# Patient Record
Sex: Female | Born: 1950 | Race: White | Hispanic: No | Marital: Single | Smoking: Never smoker
Health system: Southern US, Community
[De-identification: ages and names within clinical notes are randomized; demographics above are authoritative.]

## PROBLEM LIST (undated history)

## (undated) DIAGNOSIS — I498 Other specified cardiac arrhythmias: Secondary | ICD-10-CM

## (undated) DIAGNOSIS — K219 Gastro-esophageal reflux disease without esophagitis: Secondary | ICD-10-CM

## (undated) DIAGNOSIS — M6282 Rhabdomyolysis: Secondary | ICD-10-CM

## (undated) DIAGNOSIS — E875 Hyperkalemia: Secondary | ICD-10-CM

## (undated) DIAGNOSIS — E785 Hyperlipidemia, unspecified: Secondary | ICD-10-CM

## (undated) DIAGNOSIS — J45909 Unspecified asthma, uncomplicated: Secondary | ICD-10-CM

## (undated) DIAGNOSIS — G629 Polyneuropathy, unspecified: Secondary | ICD-10-CM

## (undated) DIAGNOSIS — E119 Type 2 diabetes mellitus without complications: Secondary | ICD-10-CM

## (undated) DIAGNOSIS — J189 Pneumonia, unspecified organism: Secondary | ICD-10-CM

## (undated) DIAGNOSIS — I471 Supraventricular tachycardia: Secondary | ICD-10-CM

## (undated) DIAGNOSIS — I4719 Other supraventricular tachycardia: Secondary | ICD-10-CM

## (undated) DIAGNOSIS — I10 Essential (primary) hypertension: Secondary | ICD-10-CM

## (undated) DIAGNOSIS — K589 Irritable bowel syndrome without diarrhea: Secondary | ICD-10-CM

## (undated) DIAGNOSIS — W19XXXA Unspecified fall, initial encounter: Secondary | ICD-10-CM

## (undated) DIAGNOSIS — C649 Malignant neoplasm of unspecified kidney, except renal pelvis: Secondary | ICD-10-CM

## (undated) DIAGNOSIS — Y92009 Unspecified place in unspecified non-institutional (private) residence as the place of occurrence of the external cause: Secondary | ICD-10-CM

## (undated) DIAGNOSIS — M109 Gout, unspecified: Secondary | ICD-10-CM

## (undated) HISTORY — DX: Gastro-esophageal reflux disease without esophagitis: K21.9

## (undated) HISTORY — DX: Gout, unspecified: M10.9

## (undated) HISTORY — DX: Unspecified asthma, uncomplicated: J45.909

## (undated) HISTORY — DX: Supraventricular tachycardia: I47.1

## (undated) HISTORY — DX: Malignant neoplasm of unspecified kidney, except renal pelvis: C64.9

## (undated) HISTORY — DX: Rhabdomyolysis: M62.82

## (undated) HISTORY — PX: OTHER SURGICAL HISTORY: SHX169

## (undated) HISTORY — DX: Unspecified place in unspecified non-institutional (private) residence as the place of occurrence of the external cause: Y92.009

## (undated) HISTORY — DX: Hyperlipidemia, unspecified: E78.5

## (undated) HISTORY — DX: Unspecified fall, initial encounter: W19.XXXA

## (undated) HISTORY — DX: Other specified cardiac arrhythmias: I49.8

## (undated) HISTORY — DX: Other supraventricular tachycardia: I47.19

## (undated) HISTORY — PX: HEMORROIDECTOMY: SUR656

## (undated) HISTORY — DX: Hyperkalemia: E87.5

## (undated) HISTORY — DX: Pneumonia, unspecified organism: J18.9

---

## 1999-01-03 ENCOUNTER — Other Ambulatory Visit: Admission: RE | Admit: 1999-01-03 | Discharge: 1999-01-03 | Payer: Self-pay | Admitting: Internal Medicine

## 1999-01-03 ENCOUNTER — Encounter (INDEPENDENT_AMBULATORY_CARE_PROVIDER_SITE_OTHER): Payer: Self-pay | Admitting: Specialist

## 1999-01-03 ENCOUNTER — Encounter: Payer: Self-pay | Admitting: Internal Medicine

## 1999-05-09 ENCOUNTER — Other Ambulatory Visit: Admission: RE | Admit: 1999-05-09 | Discharge: 1999-05-09 | Payer: Self-pay | Admitting: *Deleted

## 1999-07-01 ENCOUNTER — Encounter: Payer: Self-pay | Admitting: Surgery

## 1999-07-01 ENCOUNTER — Ambulatory Visit (HOSPITAL_COMMUNITY): Admission: RE | Admit: 1999-07-01 | Discharge: 1999-07-01 | Payer: Self-pay | Admitting: Surgery

## 1999-08-06 ENCOUNTER — Encounter (INDEPENDENT_AMBULATORY_CARE_PROVIDER_SITE_OTHER): Payer: Self-pay | Admitting: Specialist

## 1999-08-06 ENCOUNTER — Ambulatory Visit (HOSPITAL_COMMUNITY): Admission: RE | Admit: 1999-08-06 | Discharge: 1999-08-07 | Payer: Self-pay

## 1999-09-13 ENCOUNTER — Emergency Department (HOSPITAL_COMMUNITY): Admission: EM | Admit: 1999-09-13 | Discharge: 1999-09-13 | Payer: Self-pay | Admitting: Emergency Medicine

## 1999-09-20 ENCOUNTER — Inpatient Hospital Stay (HOSPITAL_COMMUNITY): Admission: AD | Admit: 1999-09-20 | Discharge: 1999-09-25 | Payer: Self-pay | Admitting: Cardiology

## 1999-09-20 ENCOUNTER — Encounter: Payer: Self-pay | Admitting: Internal Medicine

## 1999-09-21 ENCOUNTER — Encounter: Payer: Self-pay | Admitting: Internal Medicine

## 1999-09-24 ENCOUNTER — Encounter: Payer: Self-pay | Admitting: Internal Medicine

## 2000-01-02 ENCOUNTER — Other Ambulatory Visit: Admission: RE | Admit: 2000-01-02 | Discharge: 2000-01-02 | Payer: Self-pay | Admitting: Internal Medicine

## 2000-01-02 ENCOUNTER — Encounter: Payer: Self-pay | Admitting: Internal Medicine

## 2001-10-29 ENCOUNTER — Other Ambulatory Visit: Admission: RE | Admit: 2001-10-29 | Discharge: 2001-10-29 | Payer: Self-pay | Admitting: *Deleted

## 2003-07-05 ENCOUNTER — Encounter: Payer: Self-pay | Admitting: Family Medicine

## 2003-07-05 LAB — CONVERTED CEMR LAB

## 2004-04-29 ENCOUNTER — Encounter: Admission: RE | Admit: 2004-04-29 | Discharge: 2004-04-29 | Payer: Self-pay | Admitting: Family Medicine

## 2004-09-23 ENCOUNTER — Ambulatory Visit: Payer: Self-pay | Admitting: Internal Medicine

## 2006-07-21 ENCOUNTER — Ambulatory Visit: Payer: Self-pay | Admitting: Family Medicine

## 2006-07-21 DIAGNOSIS — E119 Type 2 diabetes mellitus without complications: Secondary | ICD-10-CM

## 2006-07-21 DIAGNOSIS — L538 Other specified erythematous conditions: Secondary | ICD-10-CM | POA: Insufficient documentation

## 2006-07-21 DIAGNOSIS — I10 Essential (primary) hypertension: Secondary | ICD-10-CM | POA: Insufficient documentation

## 2006-07-21 DIAGNOSIS — L2089 Other atopic dermatitis: Secondary | ICD-10-CM | POA: Insufficient documentation

## 2006-07-28 ENCOUNTER — Encounter: Payer: Self-pay | Admitting: Family Medicine

## 2006-07-28 DIAGNOSIS — E785 Hyperlipidemia, unspecified: Secondary | ICD-10-CM | POA: Insufficient documentation

## 2006-07-28 DIAGNOSIS — Q605 Renal hypoplasia, unspecified: Secondary | ICD-10-CM

## 2006-07-28 DIAGNOSIS — C649 Malignant neoplasm of unspecified kidney, except renal pelvis: Secondary | ICD-10-CM | POA: Insufficient documentation

## 2006-07-28 DIAGNOSIS — Q602 Renal agenesis, unspecified: Secondary | ICD-10-CM | POA: Insufficient documentation

## 2006-08-05 ENCOUNTER — Ambulatory Visit: Payer: Self-pay | Admitting: Family Medicine

## 2006-08-05 LAB — CONVERTED CEMR LAB: Hgb A1c MFr Bld: 6.7 %

## 2006-12-10 ENCOUNTER — Ambulatory Visit: Payer: Self-pay | Admitting: Family Medicine

## 2006-12-10 LAB — CONVERTED CEMR LAB
Bilirubin Urine: NEGATIVE
Blood in Urine, dipstick: NEGATIVE
Glucose, Urine, Semiquant: NEGATIVE
Hgb A1c MFr Bld: 6.7 %
Ketones, urine, test strip: NEGATIVE
Nitrite: NEGATIVE
Specific Gravity, Urine: 1.02
pH: 6

## 2007-01-07 ENCOUNTER — Telehealth: Payer: Self-pay | Admitting: Family Medicine

## 2007-01-18 ENCOUNTER — Encounter: Payer: Self-pay | Admitting: Family Medicine

## 2007-01-18 ENCOUNTER — Ambulatory Visit: Payer: Self-pay | Admitting: Internal Medicine

## 2007-07-09 ENCOUNTER — Ambulatory Visit: Payer: Self-pay | Admitting: Family Medicine

## 2007-07-09 LAB — CONVERTED CEMR LAB: Microalbumin U total vol: 80 mg/L

## 2007-07-27 ENCOUNTER — Telehealth: Payer: Self-pay | Admitting: Family Medicine

## 2007-08-05 ENCOUNTER — Ambulatory Visit: Payer: Self-pay | Admitting: Family Medicine

## 2007-09-03 ENCOUNTER — Encounter: Payer: Self-pay | Admitting: Family Medicine

## 2007-09-03 LAB — CONVERTED CEMR LAB
Albumin: 4.4 g/dL (ref 3.5–5.2)
Alkaline Phosphatase: 97 units/L (ref 39–117)
BUN: 23 mg/dL (ref 6–23)
Calcium: 10.1 mg/dL (ref 8.4–10.5)
Creatinine, Ser: 0.79 mg/dL (ref 0.40–1.20)
Glucose, Bld: 121 mg/dL — ABNORMAL HIGH (ref 70–99)
HDL: 41 mg/dL (ref >39)
Potassium: 4.6 meq/L (ref 3.5–5.3)
Total CHOL/HDL Ratio: 5.3
Triglycerides: 530 mg/dL — ABNORMAL HIGH (ref <150)

## 2007-09-06 ENCOUNTER — Telehealth: Payer: Self-pay | Admitting: Family Medicine

## 2007-09-06 ENCOUNTER — Ambulatory Visit: Payer: Self-pay | Admitting: Family Medicine

## 2007-09-08 DIAGNOSIS — D126 Benign neoplasm of colon, unspecified: Secondary | ICD-10-CM

## 2007-09-08 DIAGNOSIS — M109 Gout, unspecified: Secondary | ICD-10-CM

## 2007-09-08 DIAGNOSIS — K219 Gastro-esophageal reflux disease without esophagitis: Secondary | ICD-10-CM

## 2007-09-08 DIAGNOSIS — K589 Irritable bowel syndrome without diarrhea: Secondary | ICD-10-CM | POA: Insufficient documentation

## 2008-01-03 ENCOUNTER — Ambulatory Visit: Payer: Self-pay | Admitting: Family Medicine

## 2008-03-22 ENCOUNTER — Ambulatory Visit: Payer: Self-pay | Admitting: Family Medicine

## 2008-03-22 LAB — CONVERTED CEMR LAB
Direct LDL: 124 mg/dL — ABNORMAL HIGH
HDL: 43 mg/dL (ref >39)
Triglycerides: 470 mg/dL — ABNORMAL HIGH (ref <150)

## 2008-11-20 ENCOUNTER — Encounter: Payer: Self-pay | Admitting: Family Medicine

## 2009-01-02 ENCOUNTER — Ambulatory Visit: Payer: Self-pay | Admitting: Family Medicine

## 2009-01-04 ENCOUNTER — Encounter: Payer: Self-pay | Admitting: Family Medicine

## 2009-01-04 LAB — CONVERTED CEMR LAB
BUN: 19 mg/dL (ref 6–23)
Basophils Relative: 1 % (ref 0–1)
CO2: 19 meq/L (ref 19–32)
Cholesterol: 235 mg/dL — ABNORMAL HIGH (ref 0–200)
Creatinine, Ser: 0.87 mg/dL (ref 0.40–1.20)
Eosinophils Absolute: 0.8 10*3/uL — ABNORMAL HIGH (ref 0.0–0.7)
Eosinophils Relative: 7 % — ABNORMAL HIGH (ref 0–5)
Glucose, Bld: 184 mg/dL — ABNORMAL HIGH (ref 70–99)
Hemoglobin: 13.3 g/dL (ref 12.0–15.0)
Hgb A1c MFr Bld: 7.7 % — ABNORMAL HIGH (ref 4.6–6.1)
MCHC: 32.2 g/dL (ref 30.0–36.0)
MCV: 93.9 fL (ref 78.0–100.0)
Monocytes Absolute: 0.9 10*3/uL (ref 0.1–1.0)
Monocytes Relative: 8 % (ref 3–12)
RBC: 4.4 M/uL (ref 3.87–5.11)
Sodium: 137 meq/L (ref 135–145)
Total Bilirubin: 0.4 mg/dL (ref 0.3–1.2)
Total Protein: 7.1 g/dL (ref 6.0–8.3)
Triglycerides: 544 mg/dL — ABNORMAL HIGH (ref <150)

## 2009-01-16 ENCOUNTER — Telehealth: Payer: Self-pay | Admitting: Family Medicine

## 2009-01-23 ENCOUNTER — Telehealth: Payer: Self-pay | Admitting: Family Medicine

## 2009-04-05 ENCOUNTER — Encounter: Payer: Self-pay | Admitting: Family Medicine

## 2009-08-16 ENCOUNTER — Encounter (INDEPENDENT_AMBULATORY_CARE_PROVIDER_SITE_OTHER): Payer: Self-pay | Admitting: *Deleted

## 2009-09-05 ENCOUNTER — Ambulatory Visit: Payer: Self-pay | Admitting: Family Medicine

## 2010-03-04 ENCOUNTER — Telehealth: Payer: Self-pay | Admitting: Family Medicine

## 2010-03-22 ENCOUNTER — Encounter: Payer: Self-pay | Admitting: Family Medicine

## 2010-03-26 ENCOUNTER — Encounter (INDEPENDENT_AMBULATORY_CARE_PROVIDER_SITE_OTHER): Payer: Self-pay | Admitting: *Deleted

## 2010-06-05 ENCOUNTER — Encounter: Payer: Self-pay | Admitting: Family Medicine

## 2010-06-12 ENCOUNTER — Encounter: Payer: Self-pay | Admitting: Family Medicine

## 2010-07-04 LAB — CONVERTED CEMR LAB
Glucose: 146 mg/dL
Hgb A1c MFr Bld: 6.7 %

## 2010-07-04 NOTE — Miscellaneous (Signed)
   Clinical Lists Changes  Observations: Added new observation of MAMMOGRAM: normal (03/22/2010 16:31)      Preventive Care Screening  Mammogram:    Date:  03/22/2010    Results:  normal

## 2010-07-04 NOTE — Letter (Signed)
Summary: New Patient Letter  [facility]  [address] [place], [place] [address]   Phone: [phone]  Fax: [phone]       08/16/2009 MRN: [id number]  Jody Simpson [address] [place], [place]  [address]  Dear Jody Simpson,   Welcome to [facility] and thank you for choosing [place] as your Primary Care Providers. Enclosed you will find information about our practice that we hope you find helpful. We have also enclosed forms to be filled out prior to your visit. This will provide [place] with the necessary information and facilitate your being seen in a timely manner. If you have any questions, please call us at:  [phone]    and we will be happy to assist you. We look forward to seeing you at your scheduled appointment time.  Appointment   Wednesday September 05, 2009 @ 10am    with Dr.  Beverely Low               Sincerely,  Primary Health Care Team  Please arrive 15 minutes early for your first appointment and bring your insurance card. Co-pay is required at the time of your visit.  *****Please call the office if you are not able to keep this appointment. There is a charge of $50.00 if any appointment is not cancelled or rescheduled within 24 hours.

## 2010-07-04 NOTE — Assessment & Plan Note (Signed)
Summary: new to est//tx from [facility]   Vital Signs:  Patient profile:   60 year old female Height:      66 inches Weight:      295 pounds BMI:     47.79 Pulse rate:   76 / minute BP sitting:   126 / 80  (left arm)  Vitals Entered By: Doristine Devoid (September 05, 2009 10:03 AM) CC: new est- REFILL ON MEDS   History of Present Illness: 60 yo woman here today to establish care after switching PCPs.  1) DM- Sees Dr Chestine Spore (Endo).  Has run out of Glipizide, needs refill.  last A1C in 11/10.  has appt upcoming in June.    2) HTN- taking Lisinopril HCT and Atenolol.  adequately controlled here today.  no CP, SOB, HAs, visual changes, edema.  3) Hyperlipidemia- reports she is unable to take cholesterol meds due to adverse rxns.  statins exacerbate IBS.  taking 1 fish oil daily, reports unable to take more.  has not taken Wellchol and doesn't recall Zetia.  4) S/P renal cancer and L kidney removal- follows w/ Dr Vernie Ammons (uro) yearly.  5) IBS- Dr [name] is GI (hasn't seen recently)  Health Maintainence- GYN retired, has not had pap in 'a few years'.    Preventive Screening-Counseling & Management  Alcohol-Tobacco     Alcohol drinks/day: 0     Smoking Status: never  Caffeine-Diet-Exercise     Does Patient Exercise: no      Sexual History:  not in a relationship.        Drug Use:  never.    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 Take 1 Tablet By Mouth Once A  Day 2)  Glipizide 10 Mg  Tabs (Glipizide) .... Take 1 Tablet By Mouth Two Times A Day 3)  Famotidine 20 Mg Tabs (Famotidine) .... Take 2 Tablets Two Times A Day 4)  Allopurinol 300 Mg Tabs (Allopurinol) .... Take 1 Tablet By Mouth Once A Day 5)  Claritin 10 Mg Tabs (Loratadine) .... Take 1 Tablet By Mouth Once A Day 6)  Fluticasone Propionate 50 Mcg/act Susp (Fluticasone Propionate) .Marland Kitchen.. 1 Spray in Each Nostril Daily 7)  Calcarb 600/d 600-125 Mg-Unit Tabs (Calcium-Vitamin D) .Marland Kitchen.. 1 Tab By Mouth Bid 8)  Multivitamins  Tabs (Multiple Vitamin) .Marland Kitchen.. 1 Tab By Mouth Daily 9)  Desoximetasone 0.25 %  Crea (Desoximetasone) .... Use For Skin Rash 10)  Promethazine Hcl 25 Mg  Tabs (Promethazine Hcl) .... Take One Tablet By Mouth As Needed For Nausea 11)  Strips Adn Lancets .... Freestyle Light Glucose Meter 12)  Wheelchair .... of Choice.  Dx: Gout, Dm. 13)  Atenolol 50 Mg Tabs (Atenolol) .... Take One Tablet Two Times A Day 14)  Actos 15 Mg Tabs (Pioglitazone Hcl) .... Take One Tablet Daily  Allergies (verified): 1)  ! Pcn 2)  ! Demerol 3)  ! * Ivp Dye 4)  ! Iodine 5)  ! Hydrocodone 6)  ! Metformin Hcl (Metformin Hcl) 7)  ! Pravastatin  Sodium (Pravastatin Sodium) 8)  ! * Latex 9)  ! * Bananas,lettuce,onions,garlic,spicy Foods 10)  Asa 11)  * Aleve 12)  Codeine 13)  Valium 14)  Questran (Cholestyramine) 15)  Januvia (Sitagliptin Phosphate)  Past History:  Past Medical History: Diabetes mellitus, type II, diagnosed 9-04 : Endocrine is Dr. 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 - Dr [name] Hyperparathyroidism  hyperlipidemia colon polyps 8/00  Past Surgical History: ankle surgery 1994, 1995, 1998- Dr Uvaldo Bristle hemorrhoid surgery 1996, Dr Freddie Apley, benign reasons L kidney removed due to renal cell carcinoma, Dr Vernie Ammons Urol 1-97 hysterectomy 1997, Dr Carey Bullocks (retired) parathyroid surgery 2001, Dr Hillis Range  Family History: CAD-father HTN-no DM-paternal  grandmother STROKE-paternal grandmother COLON CA-no BREAST CA-mother  Social History: Single Never Smoked Alcohol use-no Sexual History:  not in a relationship Drug Use:  never  Review of Systems      See HPI  Physical Exam  General:  Well-developed,well-nourished,in no acute distress; alert,appropriate and cooperative throughout examination. Head:  Normocephalic and atraumatic without obvious abnormalities. No apparent alopecia or balding. Neck:  No deformities, masses, or tenderness noted.  Mild TM.   Lungs:  Normal respiratory effort, chest expands symmetrically. Lungs are clear to auscultation, no crackles or wheezes. Heart:  Normal rate and regular rhythm. S1 and S2 normal without gallop, murmur, click, rub or other extra sounds. Abdomen:  soft, NT/ND, +BS Pulses:  +2 carotid, radial, DP Extremities:  no C/C/E Psych:  Cognition and judgment appear intact. Alert and cooperative with normal attention span and concentration. No apparent delusions, illusions, hallucinations   Impression & Recommendations:  Problem # 1:  DIABETES MELLITUS, TYPE II (ICD-250.00) Assessment Unchanged pt following w/ Dr Chestine Spore.  pt to ask him if he would like quaterly A1Cs since she is only seeing him Q6 months.  would be happy to do interval A1Cs and fax him results.  med refills provided. The following medications were removed from the medication list:    Onglyza 5 Mg Tabs (Saxagliptin hcl) .Marland Kitchen... Take 1 tablet by mouth once a day Her updated medication list for this problem includes:    Lisinopril-hydrochlorothiazide 20-25 Mg Tabs (Lisinopril-hydrochlorothiazide) .Marland Kitchen... Take 1 tablet by mouth once a day    Glipizide 10 Mg Tabs (Glipizide) .Marland Kitchen... Take 1 tablet by mouth two times a day    Actos 15 Mg Tabs (Pioglitazone hcl) .Marland Kitchen... Take one tablet daily  Problem # 2:  HYPERLIPIDEMIA (ICD-272.4) Assessment: Unchanged pt only taking 1 fish oil.  reports she is intolerant of statins- will consider Zetia  or Welchol depending on upcoming labs. The following medications were removed from the medication list:    Lovaza 1 Gm Caps (Omega-3-acid ethyl esters) .Marland KitchenMarland KitchenMarland KitchenMarland Kitchen 4 tabs by mouth daily.  Problem # 3:  HYPERTENSION, BENIGN ESSENTIAL (ICD-401.1) Assessment: Unchanged pt's BP adequately controlled and she's asymptomatic.  no need for med changes at this time. The following medications were removed from the medication list:    Metoprolol Tartrate 100 Mg Tabs (Metoprolol tartrate) .Marland Kitchen... Take 1 tablet by mouth two times a day Her updated medication list for this problem includes:    Lisinopril-hydrochlorothiazide 20-25 Mg Tabs (Lisinopril-hydrochlorothiazide) .Marland Kitchen... Take 1 tablet by mouth once a day    Atenolol 50 Mg Tabs (Atenolol) .Marland Kitchen... Take one tablet two times a day  Complete Medication List: 1)  Lisinopril-hydrochlorothiazide 20-25 Mg Tabs (Lisinopril-hydrochlorothiazide) .... Take 1 tablet by mouth once a day 2)  Glipizide 10 Mg Tabs (Glipizide) .... Take 1 tablet by mouth two times  a day 3)  Famotidine 20 Mg Tabs (Famotidine) .... Take 2 tablets two times a day 4)  Allopurinol 300 Mg Tabs (Allopurinol) .... Take 1 tablet by mouth once a day 5)  Claritin 10 Mg Tabs (Loratadine) .... Take 1 tablet by mouth once a day 6)  Fluticasone Propionate 50 Mcg/act Susp (Fluticasone propionate) .Marland Kitchen.. 1 spray in each nostril daily 7)  Calcarb 600/d 600-125 Mg-unit Tabs (Calcium-vitamin d) .Marland Kitchen.. 1 tab by mouth bid 8)  Multivitamins Tabs (Multiple vitamin) .Marland Kitchen.. 1 tab by mouth daily 9)  Desoximetasone 0.25 % Crea (Desoximetasone) .... Use for skin rash 10)  Promethazine Hcl 25 Mg Tabs (Promethazine hcl) .... Take one tablet by mouth as needed for nausea 11)  Strips Adn Lancets  .... Freestyle light glucose meter 12)  Wheelchair  .... Of choice.  dx: gout, dm. 13)  Atenolol 50 Mg Tabs (Atenolol) .... Take one tablet two times a day 14)  Actos 15 Mg Tabs (Pioglitazone hcl) .... Take one tablet daily  Patient  Instructions: 1)  Please schedule a complete physical in the next 6 weeks at your convenience.  Do not eat before this appt 2)  Call with any questions or concerns 3)  Continue all your current medications 4)  Welcome!  We're glad to have you! Prescriptions: FAMOTIDINE 20 MG TABS (FAMOTIDINE) take 2 tablets two times a day  #120 x 3   Entered and Authorized by:   Neena Rhymes MD   Signed by:   Neena Rhymes MD on 09/05/2009   Method used:   Electronically to        [facility]* (retail)       [address]       [place], [place]  [address]       Ph: [phone]       Fax: [phone]   RxID:   [id number] ATENOLOL 50 MG TABS (ATENOLOL) take one tablet two times a day  #180 x 3   Entered and Authorized by:   Neena Rhymes MD   Signed by:   Neena Rhymes MD on 09/05/2009   Method used:   Electronically to        [facility]* (mail-order)             ,          Ph: [phone]       Fax: [phone]   RxID:   [id number] LISINOPRIL-HYDROCHLOROTHIAZIDE 20-25 MG  TABS (LISINOPRIL-HYDROCHLOROTHIAZIDE) Take 1 tablet by mouth once a day  #90 x 3   Entered and Authorized by:   Neena Rhymes MD   Signed by:   Neena Rhymes MD on 09/05/2009   Method used:   Electronically to        [facility]* (mail-order)             ,          Ph: [phone]       Fax: [phone]   RxID:   [id number] GLIPIZIDE 10 MG  TABS (GLIPIZIDE) Take 1 tablet by mouth two times a day  #180 x 3   Entered and Authorized by:   Neena Rhymes MD   Signed by:   Neena Rhymes MD on 09/05/2009   Method used:   Electronically to        [facility]* (mail-order)             ,  Ph: [phone]       Fax: [phone]   RxID:   [id number] ALLOPURINOL 300 MG TABS (ALLOPURINOL) Take 1 tablet by mouth once a day  #90 x 3   Entered and Authorized by:   Neena Rhymes MD   Signed by:   Neena Rhymes MD on  09/05/2009   Method used:   Electronically to        [facility]* (mail-order)             ,          Ph: [phone]       Fax: [phone]   RxID:   [id number] FLUTICASONE PROPIONATE 50 MCG/ACT SUSP (FLUTICASONE PROPIONATE) 1 spray in each nostril daily  #1 x 5   Entered and Authorized by:   Neena Rhymes MD   Signed by:   Neena Rhymes MD on 09/05/2009   Method used:   Electronically to        [facility] (retail)       [address]       [place], [place]  [address]       Ph: [phone]       Fax: [phone]   RxID:   [id number] FAMOTIDINE 40 MG TABS (FAMOTIDINE) one by mouth two times a day  #180 x 3   Entered and Authorized by:   Neena Rhymes MD   Signed by:   Neena Rhymes MD on 09/05/2009   Method used:   Electronically to        [facility] (retail)       [address]       [place], [place]  [address]       Ph: [phone]       Fax: [phone]   RxID:   [id number]

## 2010-07-04 NOTE — Progress Notes (Signed)
Summary: REFILL REQUEST  Phone Note Refill Request Call back at [phone] Message from:  Pharmacy on March 04, 2010 8:40 AM  Refills Requested: Medication #1:  FAMOTIDINE 20 MG TABS take 2 tablets two times a day   Dosage confirmed as above?Dosage Confirmed   Supply Requested: 3 months   Last Refilled: 01/31/2010 [facility]  Next Appointment Scheduled: NONE Initial call taken by: Lavell Islam,  March 04, 2010 8:41 AM    Prescriptions: FAMOTIDINE 20 MG TABS (FAMOTIDINE) take 2 tablets two times a day  #120 x 0   Entered by:   Lucious Groves CMA   Authorized by:   Neena Rhymes MD   Signed by:   Lucious Groves CMA on 03/04/2010   Method used:   Electronically to        [facility] (retail)       [address]       [place], [place]  [address]       Ph: [phone]       Fax: [phone]   RxID:   [id number]

## 2010-10-15 NOTE — Assessment & Plan Note (Signed)
[facility]                         GASTROENTEROLOGY OFFICE NOTE   NAME:SHIELDSAvary, Eichenberger                      MRN:          [id number]  DATE:01/18/2007                            DOB:          Mar 05, 1951    Ms. Andrew is a 60 year old white female with irritable bowel  syndrome/diarrhea, history of __________  polyp of the colon on  colonoscopy in 2000, last colonoscopy being in 2004.  She has  intermittent nausea, vomiting, gastroesophageal reflux.  She had  abdominal ultrasound and abnormal HIDA scan of a decreased ejection  fraction.  She is here today because of episode of diarrhea, of which  mostly simply responded to short course of Septra.   MEDICATIONS:  1. Lisinopril HCTZ 20/12.5 mg p.o. daily.  2. Atenolol 50 mg p.o. b.i.d.  3. Glipizide ER 10 mg p.o. daily.  4. Famotidine 40 mg p.o. b.i.d.  5. Allopurinol 300 mg p.o. daily.  6. Claritin.  7. Calcium supplements.  8. Vitamin D.   PHYSICAL EXAMINATION:  Blood pressure 120/80, pulse 84, weight 168  pounds.  She was alert, oriented, overweight.  LUNGS:  Clear to auscultation.  COR:  Normal S1 and S2.  ABDOMEN:  Quite obese, tender in right middle quadrant.  Normoactive  bowel sounds, no distention, no fluid __________ .  RECTAL:  Very tender anal canal with some erythema around it, stool was  Hemoccult negative.   IMPRESSION:  A 60 year old white female with irritable bowel  syndrome/diarrhea, diarrhea may be possibly related to a bacterial  overgrowth because patient responded to short course of antibiotics.   PLAN:  1. Analpram cream samples given for rectal irritation.  2. Samples of Aciphex 20 mg a day to take for next 10 days for      increasing reflux.  3. Septra DS prescription given, dispensed 20, one p.o. b.i.d.  Take      as a pulse therapy for bacterial overgrowth.  She is due for her      next colonoscopy in 2009.     Hedwig Morton. [name], MD  Electronically Signed   DMB/MedQ  DD: 01/18/2007  DT: 01/19/2007  Job #: [id number]   cc:   Nani Gasser, M.D.

## 2010-10-18 NOTE — H&P (Signed)
[place]  Patient:    Jody Simpson, Jody Simpson                        MRN: [id number] Adm. Date:  09/20/99 Attending:  Hedwig Morton. [name], M.D. [facility] Dictator:   [name], P.A.                         History and Physical  DATE OF BIRTH:  07-Jan-1951  CHIEF COMPLAINT:  Intractable nausea, vomiting, and diarrhea.  HISTORY OF PRESENT ILLNESS:  [name] is a 60 year old white female patient f [name].  Her primary care physician is [name] in [place]. She has a history of a left renal cell carcinoma removed with nephrectomy in 1997, and is status post parathyroidectomy, August 06, 1999.  She has a history of nonspecific colitis and periodic diarrhea.  She initially had been diagnosed with irritable bowel syndrome.  She underwent a colonoscopy by Dr. Yancey Flemings in August 2000.  A diminutive colon polyp was removed and an isolated left colon erosion as biopsied and noted.  Biopsy on the polyp showed a benign hamartomatous polyp of  juvenile type.  The colonic erosion showed active colitis with erosion. Differential including Crohns versus NSAID-induced lesion with other possibility including ischemia ___________ changes of ischemia-type were apparent. Initially following colonoscopy she was treated with Metamucil and ___________ in an irritable bowel approach to her symptoms.  She did undergo small-bowel series on February 06, 1999 in order to rule out Crohns disease.  This study was within normal limits.  On April 12, 1999, she had an upper abdominal ultrasound which showed no evidence for any gallbladder or biliary disease.  In January she was reevaluated by [name] who treated her for possible Crohns disease with  800 mg of mesalamine 3 times a day, along with a 3-week course of b.i.d. Flagyl and Rowasa suppositories.  The patients symptoms persisted despite these therapies nd they were discontinued.  Her  course has been that of again intermittent diarrhea with cramping abdominal distress.  On March 28, the patient was seen in her urologists office.  She had a UTI in the lab and this was treated with a course of Cipro.  Within about a week to 10 days of beginning this therapy she was having  more diarrhea and some nausea and vomiting intermittently.  These symptoms worsened and ultimately she went to the emergency room at [facility] last Friday, one week ago.  There she was given IV fluids and sent home with a prescription or a 5-day course of Cipro.  Her white blood cell count was elevated.  The patients GI symptoms continued to worsen and she is to the point where, for the past 2-3  days, she has been unable to keep anything down except for a couple of popsicles or so a day.  She did not take anything orally except for the popsicles yesterday, and diarrhea persisted about 5-6 watery stools without blood.  Nausea had abated yesterday but is occurring today.  She denies any hematemesis or coffee-ground emesis.  She describes a burning in her epigastrium up into her chest.  She feels very weak and drained.  She describes chills but has not had any fevers when she takes her temperature at home.  The patient also states that in the emergency room at [facility] her calcium level was  also noted to be decreased.  The patient presents for evaluation by Dr. [name] today, and Dr. [name] is to be admitting this patient for supportive care including IV fluids and IV antiemetics, as well as stool work-up for possible C. difficile.  Once specimens are obtained, she will be started on oral Flagyl if she is able to tolerate this, and clear liquids.  PAST MEDICAL HISTORY:  1. History of irritable bowel syndrome.  2. Questionable Crohns disease without response to appropriate therapy.  3. Obesity.  4. Hypertension.  5. Gout, on suppressive therapy with allopurinol.  PRIOR  SURGERIES:  1. Left nephrectomy for renal cell carcinoma in 1997.  2. Status post parathyroidectomy, March 2001, for parathyroid adenoma, by Dr.     Orpah Greek.  3. Chronic diarrhea with history of rectal bleeding.  4. History of benign colon polyps.  5. Status post hemorrhoid surgery.  6. 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 one     graft obtained from the patients hip.  7. Recent urinary tract infection for which she was treated with Cipro on     March 28.  ALLERGIES:  PENICILLIN, DEMEROL, CODEINE, LATEX which causes a rash, IODINE and IV DYE which cause swelling and rash.  SOCIAL HISTORY:  The patient has not worked for about 7 years because of her ankle problems.  She is not on Social Security Disability but is in the process for applying for this.  She is single and lives by herself in a house which is next  door to her parents.  She does not smoke cigarettes, chew tobacco, or drink alcohol.  FAMILY HISTORY:  Her parents are elderly but in good health with no medical problems.  She denies history of coronary artery disease, renal disease, or colon polyps.  She does have one aunt who has irritable bowel syndrome, but there is o family history of any kind of inflammatory bowel disease.  REVIEW OF SYSTEMS:  General:  The patient has been feeling chills without significant sweats, but temperatures are at 98.6 or less.  She is complaining of upper abdominal burning pain, along with the nausea and vomiting.  Watery diarrhea 5-6 times a day without blood or melena.  She does not have any rashes.  She has not had any cough, shortness of breath, or pruritic pain.  Is complaining of dry mouth and throat and urinary symptoms are currently not dysuric, though she is complaining of urinary urge, but inability to produce any significant urination.  MEDICATIONS:  1. Claritin 10 mg p.o. q.d.  2. Accuretic 20/12.5 one p.o. q.d.  3. Atenolol 50 mg 1  p.o. q.d.  4. Aciphex 20 mg 1 p.o. b.i.d.  5. Diphenoxylate/atropine 1 p.o. q.4h. p.r.n. (a medication which is not helping     her diarrhea).  6. Caltrate with vitamin D 600 mg 1 p.o. b.i.d.  7. Premarin 0.9 mg 1 p.o. q.d.   8. Allopurinol 300 mg 1/2 p.o. q.d.  PHYSICAL EXAMINATION:  GENERAL:  The patient is an obese, somewhat pale and tired, somewhat ill-looking, white female in no distress.  VITAL SIGNS:  Blood pressure 120/82, pulse 80, respirations 16.  Weight is 262 pounds which compares with 280 pounds in January 2001.  HEENT:  Sclerae are nonicteric, conjunctivae are pink, EOMI.  Oropharynx:  The mucosa is slightly dry.  Tongue is noncoated.  There are no oral or pharyngeal lesions.  NECK:  Notable for a healing intact scar at the  base of the mid-anterior neck.  There are no masses.  There is no JVD.  Thyroid is not enlarged.  CHEST:  Clear to auscultation and percussion bilaterally with good breath sounds.  HEART:  Regular rate and rhythm with audible S1 and S2.  No murmurs, rubs, or gallops.  ABDOMEN:  Soft, tender along the left abdomen.  Bowel sounds are hyperactive. Surgical scars are apparent in the transverse low midline position, as well as coming from the left flank into the left upper quadrant, these are consistent with prior surgeries.  RECTAL:  Skin tags noted.  Rectum is tender to palpation.  Stool is guaiac negative.  EXTREMITIES:  There is no edema, cyanosis, or clubbing.  NEUROLOGIC:  The patient is completely intact neurologically.  She is walking on her own without assistance.  There is no tremor.  She is alert and oriented x 3.  LABORATORY:  All pending at this time and include CBC, sed rate, CMET, parathyroid hormone, amylase, lipase, urinalysis.  EKG has also been ordered and pending. Acute abdominal series has been ordered and is pending.  Stool studies include  C. difficile toxin, ova and parasites, stool cultures, all  pending.  IMPRESSION:  1. Acute, prolonged diarrheal illness with nausea and vomiting, and now with     dehydration.  Rule out a viral gastroenteritis, though this is doubtful due to     its prolonged course.  Doubt this presents a small-bowel obstruction.  She ay     possibly have Cipro-related Clostridium difficile colitis.  2. Dehydration.  3. Chronic intermittent diarrhea with nonspecific colitis in August 2000 on     colonoscopy.  Rule out Crohns though she has had normal small-bowel     follow-through and no response to appropriate medication for Crohns disease.  4. Obesity.  5. History of renal cell carcinoma, status post nephrectomy in 1997.  6. Gastroesophageal reflux disease.  7. Status post March 2001, parathyroid surgery for adenoma.  8. Question depression.  PLAN:  The patient is to be admitted to the [facility] GI service for hydration, bowel rest, and studies as outlined above.  Hopefully she will be able to take p.o. Flagyl once she gets rehydrated with fluids and the nausea abates.  If not, we ill have to provide Flagyl IV.  If her symptoms do not resolve quickly, she may need to undergo flexible sigmoidoscopy with repeat biopsies. DD:  09/20/99 TD:  09/20/99 Job: [id number]

## 2010-10-18 NOTE — Discharge Summary (Signed)
[place]  Patient:    Jody Simpson, Jody Simpson                      MRN: [id number] Adm. Date:  [id number] Disc. Date: 09/25/99 Attending:  Mervin Hack Dictator:   [name], P.A.-C. CC:         Wilhemina Bonito. Eda Keys., M.D. [facility]             Olivia Canter, M.D. - [place], [place]             Milus Mallick, M.D.             Veverly Fells. Vernie Ammons, M.D.                           Discharge Summary  ADMISSION DIAGNOSES:  1. Intractable nausea, vomiting, and diarrhea on top of chronic diarrhea,     on top of chronic diarrhea.  Rule out viral gastroenteritis.  Rule out     partial small bowel obstruction.  Rule out recent ciprofloxacin-related     Clostridium difficile colitis.  2. History of chronic diarrhea, alternating with constipation, and occasional      blood per rectum.  Colonoscopy performed in August 2000, showing mild     nonspecific colitis.  Rule out Crohns disease, though the patient is     nonresponsive to appropriate Crohns disease therapy with mesalamine and     Flagyl.  3. Obesity.  4. History of renal cell carcinoma, status post nephrectomy in 1997.  5. Gastroesophageal reflux disease, currently on b.i.d. proton pump inhibitor.  6. History of parathyroid adenoma, status post March 2001, parathyroidectomy     by Dr. Milus Mallick.  7. Rule out depression.  8. Status post hemorrhoid surgery.  9. History of benign colon polyps. 10. 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     Prior bone graft for the ankle obtained from the patients hip.  This     problem has prevented her from working for the past seven years. 11. Recent urinary tract infection, treated with Cipro on August 28, 1999. 12. Multiple allergies including PENICILLIN, DEMEROL, CODEINE, LATEX, IODINE,     AND IV DYE.  DISCHARGE DIAGNOSES: 1. Resolved acute diarrheal illness, with no clear etiology, questionable    assay-negative  Clostridium difficile. 2. Resolving leukocytosis.  Rule out urinary tract infection.  Rule out    secondary to colitis. 3. Dehydration with intravascular volume depletion. 4. Electrolyte abnormalities, owing to dehydration, including hypokalemia    and hyponatremia, resolved. 5. Acute renal insufficiency, secondary to intravascular volume depletion,    resolved. 6. Status post poor intravenous access.  Status post peripherally inserted catheter    line placement.  The patients complaint of chest pain resolved following    repositioning of the peripherally inserted catheter line. 7. Status post parathyroidectomy with elevated parathyroid hormone level at 131.    Plans to follow up as an outpatient with Dr. Orpah Greek. 8. Heme-positive stool with history of intermittent minor rectal bleeding. 9. Irritable bowel syndrome.  PROCEDURE:  None.  CONSULTATION:  None.  PENDING LABORATORY DATA:  Urinary 5-HIAA, VIP, qualitative stool fat analysis.  LABORATORY DATA:  Chemistries:  Sodium down to 126, corrected to 144.  BUN elevated at 24, corrected to 4.  Creatinine high at 4.6, corrected to 1.0.  Potassium down to 2.9, corrected to 4.0.  Glucose ranging from 94-107.  Bilirubin 0.4, alkaline phosphatase 77, AST initially 41, down to 26, ALT initially 46, down to 30, amylase 34, lipase 29.  Parathyroid hormone intact level of 131.  Calcium level 8.8. Erythrocyte sedimentation rate is normal at 18.  Urinalysis was a poor specimen  with many epithelial cells.  It was nitrite-negative.  Protein was present as well as a large amount of bilirubin.  There were 6-10 white blood cells per high power field, 0-5 red blood cells, and many bacteria.  C. difficile stool study was negative x 2.  Ova and parasite examination on stool was negative x 3.  Acid fast bacterial culture and smear on stool did not show any acid fast bacilli. Cultures to be incubated for the next several weeks.  IMAGING  STUDIES:  Acute abdominal series with chest showed a mild ileus without  obstruction or free air.  A chest at the time of the [facility] line placement showed dual-lumen PIC line at the left basilar vein with the tip in the upper right atrium on September 21, 1999.  Repeat chest film on September 24, 1999, a 2-view chest showed he [facility] line catheter tip at the level of the cavoatrial junction.  Follow-up portable chest film following slight withdrawal of the catheter tip showed the tip lying in the superior vena cava.  HISTORY OF PRESENT ILLNESS:  Jody Simpson is a 60 year old white female whose primary care physician is [name] in [place].   She is also followed by GI issues by [name]. [place].  Her past medical is noted above.  GI-wise he was initially colonoscoped in August 2000, by Dr. Wilhemina Bonito. Eda Keys., having presented with a history of intermittent diarrhea, alternating with constipation, as well as occasional rectal bleeding, but no significant hematochezia.  He removed a diminutive colon polyp and showed an isolated left colon erosion.  The polyp as a benign hamartomatous polyp and the colonic erosion showed active colitis, of nonspecific type.  Differential including Crohns versus NSAID-induced, and possibly ischemic-induced.  Dr. Marina Goodell treated her for irritable bowel etiology o her symptoms, with Metamucil and Levbid, which did not improve anything.  She had a small bowel series on February 06, 1999, which was within normal limits, and was obtained in order to rule out Crohns disease.  In November 2000, she had a normal abdominal ultrasound.  The patient then was re-evaluated by [name]. [name], who treated her for possible Crohns disease with 2400 mg of Mesalamine daily and a three-week course of twice daily Flagyl, along with Rowasa Suppositories, as she was still continuing to have scant rectal bleeding.  None of these therapies resulted in any significant  improvement or changes in her alternating bowel habits or diarrhea, and cramping abdominal distress.  More recently on August 28, 1999, she  was diagnosed with a urinary tract infection by Dr. Loraine Leriche C. Ottelin and was treated with a course of Cipro for about one week to 10 days.  After completing the therapy with Cipro, she developed increasing diarrhea, associated with nausea and vomiting. She was seen one week prior to admission at [facility] where she was treated with IV fluids, and given another five-day course of Cipro.  At  that time her white blood cell count was elevated.  She did not have signs of recurrent urinary tract infection.  GI symptoms continued to worse, to the point where two to three days prior to admission, she was  unable to keep down anything but some popsicles, but none of her medications.  She was having nonbloody watery stools, five to six a day, and nausea and vomiting only abated when she was n.p.o. She was complaining of epigastric chest burning, and was feeling quite weak and  worn out.  She was seen in the office by [name], and admitted for supportive care, including IV fluids and a workup of her diarrheal illness.  HOSPITAL COURSE:  The patient was admitted to unit 4700.  Initially labs came back revealing fairly profound dehydration with acute renal insufficiency, hyponatremia, and hypokalemia.  These were treated appropriately, and her renal function and sodium and potassium levels returned to normal.  While hospitalized, she only had one single temperature above 100 degrees, and hat was about 100.2 degrees which occurred within 24 hours of her hospitalization. She was initially tachycardic at 102, but once she was hydrated, she had no further  tachycardia.  The diarrhea increased up to 8-10 stools daily.  She was started n Flagyl.  Her nausea and vomiting quickly abated, once she was hydrated; however, the  diarrhea persisted.  Questran was added, but this only worsened the diarrhea. Then she was placed on b.i.d. Lomotil and Citrucel fiber supplement, which resulted in much improvement.  The stools were still loose, but back to about five a day.  The patients generalized weakness resolved, once she was hydrated.  She was not  having any urinary frequency; however, a urinalysis which was a poor specimen, id have multiple epithelial cells, and was questionable, because it had multiple bacteria in it; therefore this was going to be repeated; however, the results are not available at the time of this dictation.  She also had an elevated parathyroid hormone level, and has an appointment to follow up with Dr. Orpah Greek on Oct 07, 1999, at 2:10 p.m.  The patient required a PIC line placement, because she had such poor IV access.  She started complaining of right-sided chest pain when she would lay down on her right side.  She felt that this had started after the [facility] line was placed. Dr. Sherrine Maples T. Reunion evaluated her and did not feel that the [facility] line was malpositioned or was the source of her pain; however, it was safe to retract the [facility] line catheter back 3.0 to 4.0 cm.  This resulted in some relief of the chest discomfort, but it did not completely resolved.  She did not have any arrhythmias by electrocardiogram.  She had no cardiac symptoms such as shortness of breath.  She had no palpitations.  Overall the patients acute GI illness had improved back to about her baseline.  She felt ready for discharge to home, and was to be discharged on the afternoon of September 25, 1999.  The urinalysis would be followed up, and if it were positive for recurrent urinary tract infection, it would be relayed to Dr. Vernie Ammons for further management.  It was not felt necessary to keep her in the hospital, awaiting results.  PENDING LABORATORY DATA:  Multiple pending labs for study of her  chronic diarrhea were pending at the time of this discharge.  She does have followup with [name] arranged for May 16,l 2001, in the office.  At that time these labs will be available and they can be reviewed.  The patient has had periods of intense emotion while hospitalized.  On the day prior to discharge, she was upset and was crying quite a  bit.  She did not feel  that Dr. Marina Goodell was sympathetic to her problems, and preferred to see Dr. [name]. Dr. [name] did come and see the patient, and again she has followup with Dr. [name] arranged.  The patient also requested a complete record of this hospitalization to be copied and sent to [name] in [place].  This will be completed and forwarded to him.  DISCHARGE MEDICATIONS:  1. Accuretic 20/12.5 mg q.d., not to be restarted until she is seen back     in the office with Dr. [name].  2. Atenolol 50 mg p.o. q.d., not to be restarted until seen back in the office     by Dr. [name].  3. Claritin 10 mg p.o. q.h.s.  4. Premarin 0.9 mg p.o. q.h.s.  5. Allopurinol 300 mg 1/2 tablet p.o. q.d.  6. Pepcid 20 mg p.o. b.i.d.  This replaces Aciphex, as the patient feels     this is less promoting of diarrhea, and works just as well for her reflux     symptoms, compared with Aciphex.  7. Lomotil one p.o. b.i.d. for diarrhea, or if having diarrhea.  8. Citrucel or Metamucil one tablespoon mixed with 8 ounces of fluid, to be     used b.i.d. if having diarrhea.  9. Caltrate 600 mg two p.o. q.d. 10. Metronidazole 250 mg p.o. t.i.d. for the next seven days. 11. Tylenol arthritis strength p.r.n. pain.  DIET:  The patient is advised to avoid dairy products until the diarrhea was well-resolved. DD:  09/25/99 TD:  09/25/99 Job: [id number]

## 2010-10-18 NOTE — Op Note (Signed)
[place]. [facility]  Patient:    Jody Simpson, Jody Simpson                      MRN: [id number] Proc. Date: 08/06/99 Adm. Date:  [id number] Disc. Date: [id number] Attending:  Gennie Alma CC:         Pearson Forster, M.D.             Veverly Fells. Vernie Ammons, M.D.                           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  OPERATION PERFORMED:  Minimally invasive radioguided parathyroid adenoma removal.  SURGEON:  Milus Mallick, M.D.  ASSISTANT:  Zigmund Daniel, M.D.  ANESTHESIA:  General endotracheal.  DESCRIPTION OF PROCEDURE:  Under adequate general endotracheal anesthesia, the patients neck was prepared and draped in an alternate fashion because of the patients allergy to detergent and also Betadine and latex.  Latex free gloves were used and the patient was prepared with [place] and paper drapes were employed rather than laundered drapes.  Nuclear mapping of the neck was carried out with a gamma probe. There was a hot  area in the left inferior position that was at least 400 counts per second higher than the background in the neck.  This area was just at the clavicular head.  A short incision was made, 4 cm in length due to the patients significantly deep neck.  The incision was carried through the platysma and muscle.  Short platysmal flaps were developed.  The middle cervical fascia, the sternohyoid muscle and the sternothyroid muscle on the left side were all incised using bovie electrocoagulation.  The surgical plane of the thyroid gland was entered.  The thyroid gland was then retracted superiorly and almost immediately one could see a large parathyroid adenoma poking into the field.  It was quite large.  The counts were in the range of 1500 to 2000 with background being 250 to 300.  The  time to exposure was 10 minutes.  The gland was then retracted from side to side and blood supply of the parathyroid gland was divided over small hemoclips.  This was done until the gland was completely free to deliver out of the neck.  The gland measured 2.6 cm in length, 1.5 cm in width, and 0.5 cm in thickness.  It had the typical  appearance of a parathyroid adenoma.  The ex vivo counts were in the 800 to 900  range and the four quadrants of the neck were equal following removal of the parathyroid gland and the new background count was in the range of 150 counts per second.  The sternohyoid muscle was repaired with interrupted sutures of 4-0 Vicryl. The platysmal muscle was reapproximated with interrupted sutures of 4-0 Vicryl and he subcuticular layer was reapproximated with a continuous suture of 5-0 Vicryl. Half-inch Steri-Strips were applied to the skin and sterile dressing was applied. Estimated blood loss for this procedure was negligible.  The patient tolerated he procedure well and left the operating room in satisfactory condition. DD:  08/07/99 TD:  08/07/99 Job: [id number]

## 2011-10-21 DIAGNOSIS — M25572 Pain in left ankle and joints of left foot: Secondary | ICD-10-CM | POA: Insufficient documentation

## 2016-07-02 ENCOUNTER — Emergency Department (HOSPITAL_COMMUNITY): Payer: Medicare Other

## 2016-07-02 ENCOUNTER — Encounter (HOSPITAL_COMMUNITY): Payer: Self-pay

## 2016-07-02 ENCOUNTER — Inpatient Hospital Stay (HOSPITAL_COMMUNITY)
Admission: EM | Admit: 2016-07-02 | Discharge: 2016-07-05 | DRG: 194 | Disposition: A | Discharge status: Skilled Nursing Facility | Payer: Medicare Other | Attending: Internal Medicine | Admitting: Internal Medicine

## 2016-07-02 DIAGNOSIS — I471 Supraventricular tachycardia: Secondary | ICD-10-CM | POA: Diagnosis not present

## 2016-07-02 DIAGNOSIS — Z888 Allergy status to other drugs, medicaments and biological substances status: Secondary | ICD-10-CM

## 2016-07-02 DIAGNOSIS — Z6841 Body Mass Index (BMI) 40.0 and over, adult: Secondary | ICD-10-CM | POA: Diagnosis not present

## 2016-07-02 DIAGNOSIS — R6889 Other general symptoms and signs: Secondary | ICD-10-CM

## 2016-07-02 DIAGNOSIS — Z886 Allergy status to analgesic agent status: Secondary | ICD-10-CM | POA: Diagnosis not present

## 2016-07-02 DIAGNOSIS — N390 Urinary tract infection, site not specified: Secondary | ICD-10-CM | POA: Diagnosis not present

## 2016-07-02 DIAGNOSIS — Z79899 Other long term (current) drug therapy: Secondary | ICD-10-CM

## 2016-07-02 DIAGNOSIS — Z833 Family history of diabetes mellitus: Secondary | ICD-10-CM

## 2016-07-02 DIAGNOSIS — Z880 Allergy status to penicillin: Secondary | ICD-10-CM | POA: Diagnosis not present

## 2016-07-02 DIAGNOSIS — R627 Adult failure to thrive: Secondary | ICD-10-CM | POA: Diagnosis present

## 2016-07-02 DIAGNOSIS — M109 Gout, unspecified: Secondary | ICD-10-CM | POA: Diagnosis not present

## 2016-07-02 DIAGNOSIS — Z905 Acquired absence of kidney: Secondary | ICD-10-CM

## 2016-07-02 DIAGNOSIS — E785 Hyperlipidemia, unspecified: Secondary | ICD-10-CM | POA: Diagnosis present

## 2016-07-02 DIAGNOSIS — K219 Gastro-esophageal reflux disease without esophagitis: Secondary | ICD-10-CM | POA: Diagnosis present

## 2016-07-02 DIAGNOSIS — E1140 Type 2 diabetes mellitus with diabetic neuropathy, unspecified: Secondary | ICD-10-CM | POA: Diagnosis present

## 2016-07-02 DIAGNOSIS — C649 Malignant neoplasm of unspecified kidney, except renal pelvis: Secondary | ICD-10-CM | POA: Diagnosis present

## 2016-07-02 DIAGNOSIS — E669 Obesity, unspecified: Secondary | ICD-10-CM | POA: Diagnosis present

## 2016-07-02 DIAGNOSIS — J189 Pneumonia, unspecified organism: Secondary | ICD-10-CM | POA: Diagnosis not present

## 2016-07-02 DIAGNOSIS — Q605 Renal hypoplasia, unspecified: Secondary | ICD-10-CM

## 2016-07-02 DIAGNOSIS — J181 Lobar pneumonia, unspecified organism: Secondary | ICD-10-CM

## 2016-07-02 DIAGNOSIS — I872 Venous insufficiency (chronic) (peripheral): Secondary | ICD-10-CM

## 2016-07-02 DIAGNOSIS — Q602 Renal agenesis, unspecified: Secondary | ICD-10-CM

## 2016-07-02 DIAGNOSIS — I480 Paroxysmal atrial fibrillation: Secondary | ICD-10-CM | POA: Diagnosis not present

## 2016-07-02 DIAGNOSIS — Y92013 Bedroom of single-family (private) house as the place of occurrence of the external cause: Secondary | ICD-10-CM

## 2016-07-02 DIAGNOSIS — W1839XA Other fall on same level, initial encounter: Secondary | ICD-10-CM | POA: Diagnosis present

## 2016-07-02 DIAGNOSIS — R262 Difficulty in walking, not elsewhere classified: Secondary | ICD-10-CM

## 2016-07-02 DIAGNOSIS — E119 Type 2 diabetes mellitus without complications: Secondary | ICD-10-CM

## 2016-07-02 DIAGNOSIS — R5381 Other malaise: Secondary | ICD-10-CM

## 2016-07-02 DIAGNOSIS — I10 Essential (primary) hypertension: Secondary | ICD-10-CM | POA: Diagnosis not present

## 2016-07-02 DIAGNOSIS — M6282 Rhabdomyolysis: Secondary | ICD-10-CM | POA: Diagnosis not present

## 2016-07-02 DIAGNOSIS — M25561 Pain in right knee: Secondary | ICD-10-CM | POA: Diagnosis not present

## 2016-07-02 DIAGNOSIS — E875 Hyperkalemia: Secondary | ICD-10-CM | POA: Diagnosis present

## 2016-07-02 DIAGNOSIS — E876 Hypokalemia: Secondary | ICD-10-CM | POA: Diagnosis present

## 2016-07-02 DIAGNOSIS — R0600 Dyspnea, unspecified: Secondary | ICD-10-CM

## 2016-07-02 DIAGNOSIS — I4719 Other supraventricular tachycardia: Secondary | ICD-10-CM | POA: Diagnosis not present

## 2016-07-02 DIAGNOSIS — W19XXXA Unspecified fall, initial encounter: Secondary | ICD-10-CM | POA: Diagnosis present

## 2016-07-02 DIAGNOSIS — M25461 Effusion, right knee: Secondary | ICD-10-CM

## 2016-07-02 DIAGNOSIS — Z91040 Latex allergy status: Secondary | ICD-10-CM

## 2016-07-02 DIAGNOSIS — Z9181 History of falling: Secondary | ICD-10-CM

## 2016-07-02 DIAGNOSIS — Z8249 Family history of ischemic heart disease and other diseases of the circulatory system: Secondary | ICD-10-CM

## 2016-07-02 HISTORY — DX: Morbid (severe) obesity due to excess calories: E66.01

## 2016-07-02 HISTORY — DX: Essential (primary) hypertension: I10

## 2016-07-02 HISTORY — DX: Irritable bowel syndrome, unspecified: K58.9

## 2016-07-02 HISTORY — DX: Type 2 diabetes mellitus without complications: E11.9

## 2016-07-02 HISTORY — DX: Polyneuropathy, unspecified: G62.9

## 2016-07-02 LAB — I-STAT CHEM 8, ED
BUN: 13 mg/dL (ref 6–20)
Calcium, Ion: 0.99 mmol/L — ABNORMAL LOW (ref 1.15–1.40)
Chloride: 98 mmol/L — ABNORMAL LOW (ref 101–111)
Creatinine, Ser: 0.8 mg/dL (ref 0.44–1.00)
Glucose, Bld: 201 mg/dL — ABNORMAL HIGH (ref 65–99)
HEMATOCRIT: 29 % — AB (ref 36.0–46.0)
HEMOGLOBIN: 9.9 g/dL — AB (ref 12.0–15.0)
POTASSIUM: 4.8 mmol/L (ref 3.5–5.1)
SODIUM: 134 mmol/L — AB (ref 135–145)
TCO2: 29 mmol/L (ref 0–100)

## 2016-07-02 LAB — COMPREHENSIVE METABOLIC PANEL
ALBUMIN: 3 g/dL — AB (ref 3.5–5.0)
ALT: 15 U/L (ref 14–54)
ANION GAP: 10 (ref 5–15)
AST: 39 U/L (ref 15–41)
Alkaline Phosphatase: 93 U/L (ref 38–126)
BILIRUBIN TOTAL: 2.2 mg/dL — AB (ref 0.3–1.2)
BUN: 13 mg/dL (ref 6–20)
CHLORIDE: 100 mmol/L — AB (ref 101–111)
CO2: 24 mmol/L (ref 22–32)
Calcium: 8.2 mg/dL — ABNORMAL LOW (ref 8.9–10.3)
Creatinine, Ser: 0.76 mg/dL (ref 0.44–1.00)
GFR calc Af Amer: >60 mL/min (ref >60)
GFR calc non Af Amer: >60 mL/min (ref >60)
GLUCOSE: 210 mg/dL — AB (ref 65–99)
POTASSIUM: 5.2 mmol/L — AB (ref 3.5–5.1)
SODIUM: 134 mmol/L — AB (ref 135–145)
TOTAL PROTEIN: 6.5 g/dL (ref 6.5–8.1)

## 2016-07-02 LAB — URINALYSIS, ROUTINE W REFLEX MICROSCOPIC
Bilirubin Urine: NEGATIVE
Glucose, UA: 50 mg/dL — AB
KETONES UR: 5 mg/dL — AB
Leukocytes, UA: NEGATIVE
Nitrite: NEGATIVE
PROTEIN: 30 mg/dL — AB
Specific Gravity, Urine: 1.021 (ref 1.005–1.030)
pH: 5 (ref 5.0–8.0)

## 2016-07-02 LAB — CK: CK TOTAL: 399 U/L — AB (ref 38–234)

## 2016-07-02 LAB — I-STAT CG4 LACTIC ACID, ED: Lactic Acid, Venous: 1.9 mmol/L (ref 0.5–1.9)

## 2016-07-02 MED ORDER — SODIUM CHLORIDE 0.9 % IV BOLUS (SEPSIS)
1000.0000 mL | Freq: Once | INTRAVENOUS | Status: AC
  Administered 2016-07-03: 1000 mL via INTRAVENOUS

## 2016-07-02 MED ORDER — SODIUM CHLORIDE 0.9 % IV BOLUS (SEPSIS)
1000.0000 mL | Freq: Once | INTRAVENOUS | Status: AC
  Administered 2016-07-02: 1000 mL via INTRAVENOUS

## 2016-07-02 NOTE — ED Notes (Signed)
Patient returned from X-ray 

## 2016-07-02 NOTE — Progress Notes (Signed)
Discussed pt with both RNCM and pt's bedside RN.  Medical w/u in process at this time.  Unsure of pt's competency/desire for placement, etc.  If w/u does not warrant admission,pt could potentially be placed in NH from ED under her managed care Medicare, pending PT/OT recommendations for such.  If pt is d/c'ed home, APS report can be made.  Will ask dayshift staff to f/u with pt/disposition in the am.  Creta Levin, LCSW Evening/ED Coverage [id number]

## 2016-07-02 NOTE — ED Notes (Signed)
Bed: RESA Expected date:  Expected time:  Means of arrival:  Comments: 43f fall couple of days ago with hypertension.

## 2016-07-02 NOTE — ED Triage Notes (Signed)
Fall at home 2 days ago in bedroom with right knee pain voiced no other pain voiced.

## 2016-07-02 NOTE — ED Provider Notes (Signed)
[name]   CSN: [id number] Arrival date & time: 07/02/16  2002  By signing my name below, I, Dora Sims, attest that this documentation has been prepared under the direction and in the presence of Margarita Mail, PA-C. Electronically Signed: Dora Sims, Scribe. 07/02/2016. 9:24 PM.  History   Chief Complaint Chief Complaint  Patient presents with  . Knee Pain    The history is provided by the patient and a relative. No language interpreter was used.     HPI Comments: Jody Simpson is a 66 y.o. female with PMHx significant for DM, HTN, and neuropathy who presents to the Emergency Department via EMS complaining of constant right knee pain s/p falling yesterday morning. She reports she slipped while getting out of bed but denies twisting her right knee. She denies hitting her head or losing consciousness. Pt reports she was able to crawl to her kitchen but was on the ground for ~36 hours after her fall and unable to access a telephone to call for help. Her brothers found her on her floor tonight and immediately called EMS. She has not been able to access any of her regular medications since the fall. She notes she mostly uses a wheelchair for movement and occasionally ambulates with a walker. Pt endorses exacerbation of her right knee pain with extension of the knee and with applied pressure to the knee. Pt denies numbness/tingling or any other associated symptoms.  Pt's brothers report that patient does not care for herself well and state that her home is filthy and filled with trash. They are concerned for her well-being and would like to initiate contact with a Education officer, museum.  Past Medical History:  Diagnosis Date  . Diabetes mellitus without complication ([place])   . Hypertension   . IBS (irritable bowel syndrome)   . Neuropathy [facility])     Patient Active Problem List   Diagnosis Date Noted  . COLONIC POLYPS 09/08/2007  . GOUT 09/08/2007  .  GASTROESOPHAGEAL REFLUX DISEASE 09/08/2007  . IRRITABLE BOWEL SYNDROME 09/08/2007  . RENAL CELL CANCER 07/28/2006  . HYPERLIPIDEMIA 07/28/2006  . 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 DIABETES MELLITUS, TYPE II 07/21/2006  . HYPERTENSION, BENIGN ESSENTIAL 07/21/2006  . DERMATITIS, ATOPIC 07/21/2006  . 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 kideny removal      OB History    No data available       Home Medications    Prior to Admission medications   Medication Sig Start Date End Date Taking? Authorizing Provider  allopurinol (ZYLOPRIM) 300 MG tablet TK 1 T PO QD 04/11/16  Yes Historical Provider, MD  atenolol (TENORMIN) 50 MG tablet Take 50 mg by mouth 2 (two) times daily.  06/17/16  Yes Historical Provider, MD  glipiZIDE (GLUCOTROL) 10 MG tablet TK 1 T PO BID 04/14/16  Yes Historical Provider, MD  lisinopril-hydrochlorothiazide (PRINZIDE,ZESTORETIC) 20-25 MG tablet TK 1 T PO QD 06/17/16  Yes Historical Provider, MD  loratadine (CLARITIN) 10 MG tablet Take 10 mg by mouth daily.   Yes Historical Provider, MD  pioglitazone (ACTOS) 15 MG tablet TK 1 T PO QD 06/03/16  Yes Historical Provider, MD  sulfamethoxazole-trimethoprim (BACTRIM DS,SEPTRA DS) 800-160 MG tablet Take 0.5 tablets by mouth daily.  06/20/16  Yes Historical Provider, MD  VICTOZA 18 MG/3ML SOPN TITRATE UP TO 1.8 MG UNDER THE SKIN ONCE A DAY UTD FOR 30 DAYS 04/12/16  Yes Historical Provider, MD  Family History No family history on file.  Social History Social History  Substance Use Topics  . Smoking status: Never Smoker  . Smokeless tobacco: Never Used  . Alcohol use No     Allergies   Aspirin; Cholestyramine; Codeine; Diazepam; Hydrocodone; Iodine; Latex; Meperidine hcl; Metformin; Naproxen sodium; Penicillins; Pravastatin sodium; and Sitagliptin phosphate   Review of Systems Review of Systems  A complete 10 system review of systems was obtained and all systems are  negative except as noted in the HPI and PMH.   Physical Exam Updated Vital Signs BP 171/74   Pulse 85   Temp 98.2 F (36.8 C) (Oral)   Resp (!) 33   Ht 5\' 6"  (1.676 m)   Wt (!) 319 lb (144.7 kg)   SpO2 95%   BMI 51.49 kg/m   Physical Exam  Constitutional: She is oriented to person, place, and time. She appears well-developed and well-nourished. No distress.  Morbidly obese. Exam is limited due to body habitus.  HENT:  Head: Normocephalic and atraumatic.  Mouth/Throat: Mucous membranes are dry.  Eyes: Conjunctivae and EOM are normal.  Neck: Neck supple. No tracheal deviation present.  Cardiovascular: Normal rate.   Pulmonary/Chest: Effort normal. Tachypnea noted. No respiratory distress.  Musculoskeletal: Normal range of motion.  Bilateral venous stasis dermatitis. Hypertrophic skin changes on her feet. Thick coating of feces on both feet. Right knee is minimally tender to palpation however she has pain with passive ROM. Ligaments are stable.  Neurological: She is alert and oriented to person, place, and time.  Skin: Skin is warm and dry.  Psychiatric: She has a normal mood and affect. Her behavior is normal.  Nursing note and vitals reviewed.    ED Treatments / Results  Labs (all labs ordered are listed, but only abnormal results are displayed) Labs Reviewed - No data to display  EKG  EKG Interpretation None       Radiology Dg Knee Complete 4 Views Right  Result Date: 07/02/2016 CLINICAL DATA:  Right knee pain following fall 2 days ago. Initial encounter. EXAM: RIGHT KNEE - COMPLETE 4+ VIEW COMPARISON:  None. FINDINGS: There is no evidence of acute fracture, subluxation or dislocation. A joint effusion is present. Tricompartmental degenerative changes are noted, mild to moderate in the lateral compartment. No focal bony lesions are present. IMPRESSION: Knee effusion without acute bony abnormality. Tricompartmental degenerative changes. Electronically Signed   By:  Margarette Canada M.D.   On: 07/02/2016 21:27    Procedures Procedures (including critical care time)  DIAGNOSTIC STUDIES: Oxygen Saturation is 95% on RA, adequate by my interpretation.    COORDINATION OF CARE: 9:35 PM Discussed treatment plan with pt and her brothers at bedside and they agreed to plan.  Medications Ordered in ED Medications - No data to display   Initial Impression / Assessment and Plan / ED Course  I have reviewed the triage vital signs and the nursing notes.  Pertinent labs & imaging results that were available during my care of the patient were reviewed by me and considered in my medical decision making (see chart for details).  Clinical Course as of Jul 03 25  Wed Jul 02, 2016  2234 Knee effusion on the R DG Knee Complete 4 Views Right [AH]  2234 DG Chest Lee 1 View [AH]    Clinical Course User Index [AH] Margarita Mail, PA-C    Patient will be admitted for failure to thrive, inability to walk, knee effusion. She has multiple social comorbidities  that are contributing to the need for admission including living conditions in her home that are unfit and unsafe for human habitation. Patient also appears to have a community-acquired pneumonia. Started on Levaquin.  Final Clinical Impressions(s) / ED Diagnoses   Final diagnoses:  Community acquired pneumonia of right middle lobe of lung ([place])  Failure to thrive in adult  Knee effusion, right  Unable to ambulate  Stasis dermatitis of both legs  Alteration in self-care ability    New Prescriptions Discharge Medication List as of 07/05/2016 10:28 AM    START taking these medications   Details  levofloxacin (LEVAQUIN) 750 MG tablet Take 1 tablet (750 mg total) by mouth daily., Starting Sat 07/05/2016, Print       I personally performed the services described in this documentation, which was scribed in my presence. The recorded information has been reviewed and is accurate.      Margarita Mail,  PA-C 07/07/16 Shelby Liu, MD 07/08/16 [phone]

## 2016-07-02 NOTE — ED Notes (Signed)
Patient transported to X-ray 

## 2016-07-03 ENCOUNTER — Encounter (HOSPITAL_COMMUNITY): Payer: Self-pay

## 2016-07-03 DIAGNOSIS — E1140 Type 2 diabetes mellitus with diabetic neuropathy, unspecified: Secondary | ICD-10-CM

## 2016-07-03 DIAGNOSIS — M25561 Pain in right knee: Secondary | ICD-10-CM | POA: Diagnosis not present

## 2016-07-03 DIAGNOSIS — I471 Supraventricular tachycardia: Secondary | ICD-10-CM | POA: Diagnosis not present

## 2016-07-03 DIAGNOSIS — Z6841 Body Mass Index (BMI) 40.0 and over, adult: Secondary | ICD-10-CM | POA: Diagnosis not present

## 2016-07-03 DIAGNOSIS — W19XXXA Unspecified fall, initial encounter: Secondary | ICD-10-CM | POA: Diagnosis present

## 2016-07-03 DIAGNOSIS — E875 Hyperkalemia: Secondary | ICD-10-CM | POA: Diagnosis not present

## 2016-07-03 DIAGNOSIS — Z79899 Other long term (current) drug therapy: Secondary | ICD-10-CM | POA: Diagnosis not present

## 2016-07-03 DIAGNOSIS — E669 Obesity, unspecified: Secondary | ICD-10-CM | POA: Diagnosis present

## 2016-07-03 DIAGNOSIS — I10 Essential (primary) hypertension: Secondary | ICD-10-CM | POA: Diagnosis not present

## 2016-07-03 DIAGNOSIS — J189 Pneumonia, unspecified organism: Secondary | ICD-10-CM | POA: Diagnosis not present

## 2016-07-03 DIAGNOSIS — W1839XA Other fall on same level, initial encounter: Secondary | ICD-10-CM | POA: Diagnosis not present

## 2016-07-03 DIAGNOSIS — Z886 Allergy status to analgesic agent status: Secondary | ICD-10-CM | POA: Diagnosis not present

## 2016-07-03 DIAGNOSIS — C649 Malignant neoplasm of unspecified kidney, except renal pelvis: Secondary | ICD-10-CM | POA: Diagnosis not present

## 2016-07-03 DIAGNOSIS — Y92013 Bedroom of single-family (private) house as the place of occurrence of the external cause: Secondary | ICD-10-CM | POA: Diagnosis not present

## 2016-07-03 DIAGNOSIS — W19XXXS Unspecified fall, sequela: Secondary | ICD-10-CM

## 2016-07-03 DIAGNOSIS — Z880 Allergy status to penicillin: Secondary | ICD-10-CM | POA: Diagnosis not present

## 2016-07-03 DIAGNOSIS — M109 Gout, unspecified: Secondary | ICD-10-CM | POA: Diagnosis not present

## 2016-07-03 DIAGNOSIS — M6282 Rhabdomyolysis: Secondary | ICD-10-CM | POA: Diagnosis not present

## 2016-07-03 DIAGNOSIS — I480 Paroxysmal atrial fibrillation: Secondary | ICD-10-CM | POA: Diagnosis not present

## 2016-07-03 DIAGNOSIS — R627 Adult failure to thrive: Secondary | ICD-10-CM | POA: Diagnosis present

## 2016-07-03 DIAGNOSIS — Z905 Acquired absence of kidney: Secondary | ICD-10-CM | POA: Diagnosis not present

## 2016-07-03 DIAGNOSIS — N390 Urinary tract infection, site not specified: Secondary | ICD-10-CM | POA: Diagnosis not present

## 2016-07-03 DIAGNOSIS — Z888 Allergy status to other drugs, medicaments and biological substances status: Secondary | ICD-10-CM | POA: Diagnosis not present

## 2016-07-03 LAB — CBC WITH DIFFERENTIAL/PLATELET
BASOS PCT: 0 %
Basophils Absolute: 0 10*3/uL (ref 0.0–0.1)
Eosinophils Absolute: 0.1 10*3/uL (ref 0.0–0.7)
Eosinophils Relative: 1 %
HCT: 38.5 % (ref 36.0–46.0)
Hemoglobin: 13.4 g/dL (ref 12.0–15.0)
LYMPHS ABS: 1 10*3/uL (ref 0.7–4.0)
Lymphocytes Relative: 7 %
MCH: 31.6 pg (ref 26.0–34.0)
MCHC: 34.8 g/dL (ref 30.0–36.0)
MCV: 90.8 fL (ref 78.0–100.0)
MONO ABS: 1.1 10*3/uL — AB (ref 0.1–1.0)
Monocytes Relative: 8 %
NEUTROS ABS: 12 10*3/uL — AB (ref 1.7–7.7)
NEUTROS PCT: 84 %
PLATELETS: 275 10*3/uL (ref 150–400)
RBC: 4.24 MIL/uL (ref 3.87–5.11)
RDW: 15.2 % (ref 11.5–15.5)
WBC: 14.2 10*3/uL — ABNORMAL HIGH (ref 4.0–10.5)

## 2016-07-03 LAB — GLUCOSE, CAPILLARY
Glucose-Capillary: 162 mg/dL — ABNORMAL HIGH (ref 65–99)
Glucose-Capillary: 183 mg/dL — ABNORMAL HIGH (ref 65–99)

## 2016-07-03 LAB — CREATININE, SERUM
Creatinine, Ser: 1 mg/dL (ref 0.44–1.00)
GFR calc Af Amer: >60 mL/min (ref >60)
GFR calc non Af Amer: 58 mL/min — ABNORMAL LOW (ref >60)

## 2016-07-03 LAB — CK: CK TOTAL: 414 U/L — AB (ref 38–234)

## 2016-07-03 LAB — CBC
HCT: 34.8 % — ABNORMAL LOW (ref 36.0–46.0)
HEMOGLOBIN: 11.5 g/dL — AB (ref 12.0–15.0)
MCH: 30.3 pg (ref 26.0–34.0)
MCHC: 33 g/dL (ref 30.0–36.0)
MCV: 91.8 fL (ref 78.0–100.0)
Platelets: 271 10*3/uL (ref 150–400)
RBC: 3.79 MIL/uL — ABNORMAL LOW (ref 3.87–5.11)
RDW: 15.1 % (ref 11.5–15.5)
WBC: 11.6 10*3/uL — ABNORMAL HIGH (ref 4.0–10.5)

## 2016-07-03 LAB — TSH: TSH: 1.187 u[IU]/mL (ref 0.350–4.500)

## 2016-07-03 LAB — STREP PNEUMONIAE URINARY ANTIGEN: Strep Pneumo Urinary Antigen: NEGATIVE

## 2016-07-03 MED ORDER — ACETAMINOPHEN 650 MG RE SUPP: 650.0000 mg | Freq: Four times a day (QID) | RECTAL | Status: DC | PRN

## 2016-07-03 MED ORDER — INSULIN ASPART 100 UNIT/ML ~~LOC~~ SOLN: 0.0000 [IU] | Freq: Every day | SUBCUTANEOUS | Status: DC

## 2016-07-03 MED ORDER — DEXTROSE 5 % IV SOLN
500.0000 mg | INTRAVENOUS | Status: DC
  Administered 2016-07-04 – 2016-07-05 (×2): 500 mg via INTRAVENOUS
  Filled 2016-07-03 (×2): qty 500

## 2016-07-03 MED ORDER — AZITHROMYCIN 250 MG PO TABS
500.0000 mg | ORAL_TABLET | Freq: Once | ORAL | Status: DC
  Filled 2016-07-03: qty 2

## 2016-07-03 MED ORDER — HEPARIN SODIUM (PORCINE) 5000 UNIT/ML IJ SOLN
5000.0000 [IU] | Freq: Three times a day (TID) | INTRAMUSCULAR | Status: DC
  Administered 2016-07-03 – 2016-07-05 (×6): 5000 [IU] via SUBCUTANEOUS
  Filled 2016-07-03 (×6): qty 1

## 2016-07-03 MED ORDER — LEVOFLOXACIN 750 MG PO TABS
750.0000 mg | ORAL_TABLET | Freq: Once | ORAL | Status: AC
  Administered 2016-07-03: 750 mg via ORAL
  Filled 2016-07-03: qty 1

## 2016-07-03 MED ORDER — ALBUTEROL SULFATE (2.5 MG/3ML) 0.083% IN NEBU: 2.5000 mg | INHALATION_SOLUTION | RESPIRATORY_TRACT | Status: DC | PRN

## 2016-07-03 MED ORDER — DEXTROSE 5 % IV SOLN
1.0000 g | INTRAVENOUS | Status: DC
  Administered 2016-07-04 – 2016-07-05 (×2): 1 g via INTRAVENOUS
  Filled 2016-07-03 (×2): qty 10

## 2016-07-03 MED ORDER — CEFTRIAXONE SODIUM 1 G IJ SOLR
1.0000 g | Freq: Once | INTRAMUSCULAR | Status: AC
  Administered 2016-07-03: 1 g via INTRAVENOUS
  Filled 2016-07-03: qty 10

## 2016-07-03 MED ORDER — ACETAMINOPHEN 325 MG PO TABS
650.0000 mg | ORAL_TABLET | Freq: Four times a day (QID) | ORAL | Status: DC | PRN
  Administered 2016-07-04 – 2016-07-05 (×2): 650 mg via ORAL
  Filled 2016-07-03 (×2): qty 2

## 2016-07-03 MED ORDER — ALLOPURINOL 300 MG PO TABS
300.0000 mg | ORAL_TABLET | Freq: Every day | ORAL | Status: DC
  Administered 2016-07-03 – 2016-07-05 (×3): 300 mg via ORAL
  Filled 2016-07-03 (×3): qty 1

## 2016-07-03 MED ORDER — ATENOLOL 50 MG PO TABS
50.0000 mg | ORAL_TABLET | Freq: Two times a day (BID) | ORAL | Status: DC
Start: 2016-07-03 — End: 2016-07-05
  Administered 2016-07-03 – 2016-07-05 (×5): 50 mg via ORAL
  Filled 2016-07-03 (×5): qty 1

## 2016-07-03 MED ORDER — INSULIN ASPART 100 UNIT/ML ~~LOC~~ SOLN
0.0000 [IU] | Freq: Three times a day (TID) | SUBCUTANEOUS | Status: DC
  Administered 2016-07-03: 2 [IU] via SUBCUTANEOUS
  Administered 2016-07-04: 3 [IU] via SUBCUTANEOUS
  Administered 2016-07-04 – 2016-07-05 (×3): 2 [IU] via SUBCUTANEOUS

## 2016-07-03 NOTE — ED Notes (Signed)
Pts family requesting to be contacted prior to social work consult

## 2016-07-03 NOTE — Clinical Social Work Note (Signed)
Clinical Social Work Assessment  Patient Details  Name: Jody Simpson MRN: [id number] Date of Birth: 1950-08-06  Date of referral:  07/02/16               Reason for consult:  Housing Concerns/Homelessness                Permission sought to share information with:  Facility Art therapist granted to share information::  Yes, Verbal Permission Granted  Name::        Agency::     Relationship::     Contact Information:     Housing/Transportation Living arrangements for the past 2 months:  Mobile Home Source of Information:  Patient Patient Interpreter Needed:  None Criminal Activity/Legal Involvement Pertinent to Current Situation/Hospitalization:  No - Comment as needed Significant Relationships:  Siblings, Parents Lives with:  Self Do you feel safe going back to the place where you live?  Yes Need for family participation in patient care:  Yes (Comment)  Care giving concerns:  Patient reports that she has concerns about bathing due to the condition of her shower. Patient reports no concerns for completing other ADLS. Patient reports that she utilizes her wheel chair for mobility and still drives. Patient reports that her brother have concerns about her home and do not want patient to return to her home.    Social Worker assessment / plan: CSW spoke with patient at bedside. CSW introduced self and inquired about patient's discharge plans. Patient reports that she currently completes all her ADLs and does not have any engagement with any community resources. Patient reports that she is not familiar with any local SNF's and has thought about moving to [place], [place] where she has a friend. Patient will be followed by inpatient CSW regarding disposition home vs. SNF. Patient reports that she is agreeable to this plan.   Employment status:  Retired Forensic scientist:  Programmer, applications ([employer] ) PT Recommendations:   not assessed at this  time Information / Referral to community resources:   (none)  Patient/Family's Response to care:  Patient reports that she is not opposed to SNF but prefers to have an independent living apartment based on income. Patient reports that she does not want a roommate and has a cat named fuzzy that she wants to stay with her.   Patient/Family's Understanding of and Emotional Response to Diagnosis, Current Treatment, and Prognosis:  Patient is able to verbalize understanding of her diagnosis, treatment and prognosis. Patient reports that she is anxious about discharge because of her families concerns about her returning home. Patient reports that her support system consists of her brothers, sister in law and her father.   Emotional Assessment Appearance:  Appears stated age Attitude/Demeanor/Rapport:    Affect (typically observed):  Appropriate Orientation:  Oriented to Self, Oriented to Situation, Oriented to Place, Oriented to  Time Alcohol / Substance use:  Not Applicable Psych involvement (Current and /or in the community):  No (Comment)  Discharge Needs  Concerns to be addressed:  Home Safety Concerns, Discharge Planning Concerns Readmission within the last 30 days:  No Current discharge risk:  Lives alone Barriers to Discharge:  No Barriers Identified   Burnis Medin, LCSW 07/03/2016, 8:01 AM

## 2016-07-03 NOTE — H&P (Addendum)
TRH H&P   Patient Demographics:    Jody Simpson, is a 66 y.o. female  MRN: [id number]   DOB - 10-Jan-1951  Admit Date - 07/02/2016  Outpatient Primary MD for the patient is Gerrit Heck, MD  Referring MD/NP/PA: Dr Radford Pax  Patient coming from: Home  Chief Complaint  Patient presents with  . Knee Pain      HPI:    Jody Simpson  is a 66 y.o. female, His past medical history of diabetes mellitus, hypertension, neuropathy, gout, history of renal cell cancer, patient presents with complaints of generalized weakness, and knee pain, status post falling yesterday, reports she's been feeling weak over last 24 hours, report she had a fall yesterday, where she slipped while getting out of the bed, denies any head trauma, reports that she did rest her knee, no loss of consciousness, reports she spent the night on the floor as she could not stand up, family found her on the floor, which they called EMS . She denies any chest pain, shortness of breath, fever, chills, coffee-ground emesis, diarrhea, or melena, she reports cough, nonproductive. In ED workup significant for left midlung pneumonia, totally resolved and azithromycin, positive urine analysis, as well had irregular rhythm on telemetry while in ED.    Review of systems:    In addition to the HPI above No Fever-chills, No Headache, No changes with Vision or hearing, No problems swallowing food or Liquids, No Chest pain, Ports cough, denies any shortness of breath No Abdominal pain, No Nausea or Vommitting, Bowel movements are regular, No Blood in stool or Urine, No dysuria, No new skin rashes or bruises, Complains of right knee pain No new weakness, tingling, numbness in any extremity,reports  generalized weakness No recent weight gain or loss, No polyuria, polydypsia or polyphagia, No significant Mental  Stressors.  A full 10 point Review of Systems was done, except as stated above, all other Review of Systems were negative.   With Past History of the following :    Past Medical History:  Diagnosis Date  . Diabetes mellitus without complication ([place])   . Hypertension   . IBS (irritable bowel syndrome)   . Neuropathy ([place])       Past Surgical History:  Procedure Laterality Date  . kideny removal        Social History:     Social History  Substance Use Topics  . Smoking status: Never Smoker  . Smokeless tobacco: Never Used  . Alcohol use No     Lives - Home alone  Mobility - With assistance    Family History :    History reviewed. No pertinent family history.    Home Medications:   Prior to Admission medications   Medication Sig Start Date End Date Taking? Authorizing Provider  allopurinol (ZYLOPRIM) 300 MG tablet TK 1 T PO QD 04/11/16  Yes Historical Provider, MD  atenolol (TENORMIN) 50 MG tablet Take 50 mg by mouth 2 (two) times daily.  06/17/16  Yes Historical Provider, MD  glipiZIDE (GLUCOTROL) 10 MG tablet TK 1 T PO BID 04/14/16  Yes Historical Provider, MD  lisinopril-hydrochlorothiazide (PRINZIDE,ZESTORETIC) 20-25 MG tablet TK 1 T PO QD 06/17/16  Yes Historical Provider, MD  loratadine (CLARITIN) 10 MG tablet Take 10 mg by mouth daily.   Yes Historical Provider, MD  pioglitazone (ACTOS) 15 MG tablet TK 1 T PO QD 06/03/16  Yes Historical Provider, MD  sulfamethoxazole-trimethoprim (BACTRIM DS,SEPTRA DS) 800-160 MG tablet Take 0.5 tablets by mouth daily.  06/20/16  Yes Historical Provider, MD  VICTOZA 18 MG/3ML SOPN TITRATE UP TO 1.8 MG UNDER THE SKIN ONCE A DAY UTD FOR 30 DAYS 04/12/16  Yes Historical Provider, MD     Allergies:     Allergies  Allergen Reactions  . Aspirin     REACTION: stomach irritation  . Cholestyramine     REACTION: Diarrhea and nausea  . Codeine     REACTION: stomach irritant  . Diazepam     REACTION: hyper  . Hydrocodone   .  Iodine   . Latex   . Meperidine Hcl   . Metformin     REACTION: Diarrhea  . Naproxen Sodium     REACTION: stomach irritant  . Penicillins   . Pravastatin Sodium     REACTION: Flu like symptoms  . Sitagliptin Phosphate     REACTION: Caused elevated CBGs     Physical Exam:   Vitals  Blood pressure 111/61, pulse (!) 130, temperature 98.2 F (36.8 C), temperature source Oral, resp. rate (!) 28, height 5\' 6"  (1.676 m), weight (!) 144.7 kg (319 lb), SpO2 95 %.   1. General  Obese  female lying in bed in NAD,   2. Normal affect and insight, Not Suicidal or Homicidal, Awake Alert, Oriented X 3.  3. No F.N deficits, ALL C.Nerves Intact, Strength 5/5 all 4 extremities, Sensation intact all 4 extremities, Plantars down going.  4. Ears and Eyes appear Normal, Conjunctivae clear, PERRLA. Moist Oral Mucosa.  5. Supple Neck, No JVD, No cervical lymphadenopathy appriciated, No Carotid Bruits.  6. Symmetrical Chest wall movement, Good air movement bilaterally, CTAB.  7. RRR, No Gallops, Rubs or Murmurs, No Parasternal Heave.  8. Positive Bowel Sounds, Abdomen Soft, No tenderness, No organomegaly appriciated,No rebound -guarding or rigidity.  9.  No Cyanosis, Normal Skin Turgor, No Skin Rash or Bruise.  10. Good muscle tone,  joints appear normal , no effusions, Normal ROM.  11. No Palpable Lymph Nodes in Neck or Axillae    Data Review:    CBC  Recent Labs Lab 07/02/16 2314 07/02/16 2333 07/03/16 1023  WBC  --  14.2* 11.6*  HGB 9.9* 13.4 11.5*  HCT 29.0* 38.5 34.8*  PLT  --  275 271  MCV  --  90.8 91.8  MCH  --  31.6 30.3  MCHC  --  34.8 33.0  RDW  --  15.2 15.1  LYMPHSABS  --  1.0  --   MONOABS  --  1.1*  --   EOSABS  --  0.1  --   BASOSABS  --  0.0  --    ------------------------------------------------------------------------------------------------------------------  Chemistries   Recent Labs Lab 07/02/16 2314 07/02/16 2333  NA 134* 134*  K 4.8 5.2*  CL  98* 100*  CO2  --  24  GLUCOSE 201* 210*  BUN 13 13  CREATININE 0.80  0.76  CALCIUM  --  8.2*  AST  --  39  ALT  --  15  ALKPHOS  --  93  BILITOT  --  2.2*   ------------------------------------------------------------------------------------------------------------------ estimated creatinine clearance is 104.9 mL/min (by C-G formula based on SCr of 0.76 mg/dL). ------------------------------------------------------------------------------------------------------------------ No results for input(s): TSH, T4TOTAL, T3FREE, THYROIDAB in the last 72 hours.  Invalid input(s): FREET3  Coagulation profile No results for input(s): INR, PROTIME in the last 168 hours. ------------------------------------------------------------------------------------------------------------------- No results for input(s): DDIMER in the last 72 hours. -------------------------------------------------------------------------------------------------------------------  Cardiac Enzymes No results for input(s): CKMB, TROPONINI, MYOGLOBIN in the last 168 hours.  Invalid input(s): CK ------------------------------------------------------------------------------------------------------------------ No results found for: BNP   ---------------------------------------------------------------------------------------------------------------  Urinalysis    Component Value Date/Time   COLORURINE YELLOW 07/02/2016 2310   APPEARANCEUR HAZY (A) 07/02/2016 2310   LABSPEC 1.021 07/02/2016 2310   PHURINE 5.0 07/02/2016 2310   GLUCOSEU 50 (A) 07/02/2016 2310   HGBUR SMALL (A) 07/02/2016 2310   HGBUR negative 12/10/2006 1513   BILIRUBINUR NEGATIVE 07/02/2016 2310   KETONESUR 5 (A) 07/02/2016 2310   PROTEINUR 30 (A) 07/02/2016 2310   UROBILINOGEN 0.2 12/10/2006 1513   NITRITE NEGATIVE 07/02/2016 2310   LEUKOCYTESUR NEGATIVE 07/02/2016 2310     ----------------------------------------------------------------------------------------------------------------   Imaging Results:    Dg Chest Port 1 View  Result Date: 07/02/2016 CLINICAL DATA:  Status post fall, with concern for chest injury. Initial encounter. EXAM: PORTABLE CHEST 1 VIEW COMPARISON:  None. FINDINGS: The lungs are well-aerated. Peripheral left midlung opacity could reflect pneumonia. Would correlate for any associated symptoms. There is no evidence of pleural effusion or pneumothorax. The cardiomediastinal silhouette is mildly enlarged. No acute osseous abnormalities are seen. IMPRESSION: 1. Peripheral left midlung opacity could reflect pneumonia. Would correlate for any associated symptoms. 2. Mild cardiomegaly. 3. No displaced rib fracture seen. Electronically Signed   By: Garald Balding M.D.   On: 07/02/2016 22:12   Dg Knee Complete 4 Views Right  Result Date: 07/02/2016 CLINICAL DATA:  Right knee pain following fall 2 days ago. Initial encounter. EXAM: RIGHT KNEE - COMPLETE 4+ VIEW COMPARISON:  None. FINDINGS: There is no evidence of acute fracture, subluxation or dislocation. A joint effusion is present. Tricompartmental degenerative changes are noted, mild to moderate in the lateral compartment. No focal bony lesions are present. IMPRESSION: Knee effusion without acute bony abnormality. Tricompartmental degenerative changes. Electronically Signed   By: Margarette Canada M.D.   On: 07/02/2016 21:27    My personal review of EKG: Sinus rhythm with arrhythmias Rate  1:30 /min, QTc 480 , no Acute ST changes   Assessment & Plan:    Active Problems:   Diabetes ([place])   Hyperlipemia   Gout   HYPERTENSION, BENIGN ESSENTIAL   GASTROESOPHAGEAL REFLUX DISEASE   CAP (community acquired pneumonia)  CAP - Patient reports cough, chest x-ray significant for left midlung opacity, started on Rocephin and azithromycin, follow on sputum cultures.  UTI - Continue with IV Rocephin,  follow on urine cultures  Mild  Hyperkalemia - Continue to hold lisinopril, recheck BMP in a.m.  Diabetes mellitus - Hold oral hypoglycemic agents including Actos, glipizide, as well continue to hold Victoza, continue with insulin sliding scale, check  Hgb A1c.  Hypertension - Labile, but currently soft, will hold lisinopril/hydrochlorothiazide, and continue with atenolol.  Sinus arrhythmias - Patient with irregular rhythm, unclear if A. fib or polynodal sinus arrhythmias, cardiology consulted for further recommendation  Gout - Continue with allopurinol  Has weakness/fall/right  knee pain - X-ray of right knee with no evidence of fracture, only some effusion, will consult PT   DVT Prophylaxis Heparin -  SCDs  AM Labs Ordered, also please review Full Orders  Family Communication: Admission, patients condition and plan of care including tests being ordered have been discussed with the patient who indicate understanding and agree with the plan and Code Status.  Code Status Full  Likely DC to   Pending PT evaluation  Condition GUARDED    Consults called: cardiology  Admission status: inpatient  Time spent in minutes : 65 minutes   Jaclynn Laumann M.D on 07/03/2016 at 10:52 AM  Between 7am to 7pm - Pager - [phone]. After 7pm go to [email or web address] - password [facility]  [facility] - Office  [phone]

## 2016-07-03 NOTE — Consult Note (Signed)
Reason for Consult:   Abnormal EKG  Requesting Physician: DrElgergawy Primary Cardiologist New  HPI:   66 y/o morbidly obese Caucasian female who lives alone, apparent in squalid conditions in a trailer. She has a history of NIDDM, HTN, and HLD with a history of statin intolerance. She says she slipped to floor and was unable to get up or contact anyone for two days. She was found by her family and EMS called. She says she has not had any medications for two days. Her EKG on admission showed NSR, ST. Today she has had brief PAT runs. She was on Tenormin 50 mg BID prior to admission. She denies any chest pain or palpitations.   PMHx:  Past Medical History:  Diagnosis Date  . Diabetes mellitus without complication ([place])   . Hypertension   . IBS (irritable bowel syndrome)   . Morbid obesity ([place])   . Neuropathy [facility])     Past Surgical History:  Procedure Laterality Date  . kideny removal      SOCHx:  reports that she has never smoked. She has never used smokeless tobacco. She reports that she does not drink alcohol or use drugs.  FAMHx: Family History  Problem Relation Age of Onset  . CAD Father     hx of CABG  . Diabetes Brother     ALLERGIES: Allergies  Allergen Reactions  . Aspirin     REACTION: stomach irritation  . Cholestyramine     REACTION: Diarrhea and nausea  . Codeine     REACTION: stomach irritant  . Diazepam     REACTION: hyper  . Hydrocodone   . Iodine   . Latex   . Meperidine Hcl   . Metformin     REACTION: Diarrhea  . Naproxen Sodium     REACTION: stomach irritant  . Penicillins   . Pravastatin Sodium     REACTION: Flu like symptoms  . Sitagliptin Phosphate     REACTION: Caused elevated CBGs    ROS: Review of Systems: General: negative for chills, fever, night sweats or weight changes.  Cardiovascular: negative for chest pain, dyspnea on exertion orthopnea, palpitations, paroxysmal nocturnal dyspnea or shortness of  breath HEENT: negative for any visual disturbances, blindness, glaucoma Dermatological: negative for rash Respiratory: negative for cough, hemoptysis, or wheezing Urologic: negative for hematuria or dysuria Abdominal: negative for nausea, vomiting, diarrhea, bright red blood per rectum, melena, or hematemesis Neurologic: negative for visual changes, syncope, or dizziness Musculoskeletal: negative for back pain, joint pain, or swelling Psych: cooperative and appropriate All other systems reviewed and are otherwise negative except as noted above.   HOME MEDICATIONS: Prior to Admission medications   Medication Sig Start Date End Date Taking? Authorizing Provider  allopurinol (ZYLOPRIM) 300 MG tablet TK 1 T PO QD 04/11/16  Yes Historical Provider, MD  atenolol (TENORMIN) 50 MG tablet Take 50 mg by mouth 2 (two) times daily.  06/17/16  Yes Historical Provider, MD  glipiZIDE (GLUCOTROL) 10 MG tablet TK 1 T PO BID 04/14/16  Yes Historical Provider, MD  lisinopril-hydrochlorothiazide (PRINZIDE,ZESTORETIC) 20-25 MG tablet TK 1 T PO QD 06/17/16  Yes Historical Provider, MD  loratadine (CLARITIN) 10 MG tablet Take 10 mg by mouth daily.   Yes Historical Provider, MD  pioglitazone (ACTOS) 15 MG tablet TK 1 T PO QD 06/03/16  Yes Historical Provider, MD  sulfamethoxazole-trimethoprim (BACTRIM DS,SEPTRA DS) 800-160 MG tablet Take 0.5 tablets by mouth daily.  06/20/16  Yes Historical Provider, MD  VICTOZA 18 MG/3ML SOPN TITRATE UP TO 1.8 MG UNDER THE SKIN ONCE A DAY UTD FOR 30 DAYS 04/12/16  Yes Historical Provider, MD    HOSPITAL MEDICATIONS: I have reviewed the patient's current medications.  VITALS: Blood pressure 111/61, pulse (!) 130, temperature 98.2 F (36.8 C), temperature source Oral, resp. rate (!) 28, height 5\' 6"  (1.676 m), weight (!) 319 lb (144.7 kg), SpO2 95 %.  PHYSICAL EXAM: General appearance: alert, cooperative, morbidly obese, pale and exopthalmos Neck: no carotid bruit and no  JVD Lungs: decreased at bases Heart: regular rate and rhythm  Abdomen: morbid obesity Extremities: 2-3+ bilateral LE edema  Pulses: diminnished Skin: Pale, cool, dry Neurologic: Grossly normal  LABS: Results for orders placed or performed during the hospital encounter of 07/02/16 (from the past 24 hour(s))  Urinalysis, Routine w reflex microscopic     Status: Abnormal   Collection Time: 07/02/16 11:10 PM  Result Value Ref Range   Color, Urine YELLOW YELLOW   APPearance HAZY (A) CLEAR   Specific Gravity, Urine 1.021 1.005 - 1.030   pH 5.0 5.0 - 8.0   Glucose, UA 50 (A) NEGATIVE mg/dL   Hgb urine dipstick SMALL (A) NEGATIVE   Bilirubin Urine NEGATIVE NEGATIVE   Ketones, ur 5 (A) NEGATIVE mg/dL   Protein, ur 30 (A) NEGATIVE mg/dL   Nitrite NEGATIVE NEGATIVE   Leukocytes, UA NEGATIVE NEGATIVE   RBC / HPF 0-5 0 - 5 RBC/hpf   WBC, UA 6-30 0 - 5 WBC/hpf   Bacteria, UA RARE (A) NONE SEEN   Squamous Epithelial / LPF 0-5 (A) NONE SEEN   Mucous PRESENT    Budding Yeast PRESENT   I-Stat CG4 Lactic Acid, ED     Status: None   Collection Time: 07/02/16 11:14 PM  Result Value Ref Range   Lactic Acid, Venous 1.90 0.5 - 1.9 mmol/L  I-stat chem 8, ed     Status: Abnormal   Collection Time: 07/02/16 11:14 PM  Result Value Ref Range   Sodium 134 (L) 135 - 145 mmol/L   Potassium 4.8 3.5 - 5.1 mmol/L   Chloride 98 (L) 101 - 111 mmol/L   BUN 13 6 - 20 mg/dL   Creatinine, Ser 0.80 0.44 - 1.00 mg/dL   Glucose, Bld 201 (H) 65 - 99 mg/dL   Calcium, Ion 0.99 (L) 1.15 - 1.40 mmol/L   TCO2 29 0 - 100 mmol/L   Hemoglobin 9.9 (L) 12.0 - 15.0 g/dL   HCT 29.0 (L) 36.0 - 46.0 %  CBC with Differential/Platelet     Status: Abnormal   Collection Time: 07/02/16 11:33 PM  Result Value Ref Range   WBC 14.2 (H) 4.0 - 10.5 K/uL   RBC 4.24 3.87 - 5.11 MIL/uL   Hemoglobin 13.4 12.0 - 15.0 g/dL   HCT 38.5 36.0 - 46.0 %   MCV 90.8 78.0 - 100.0 fL   MCH 31.6 26.0 - 34.0 pg   MCHC 34.8 30.0 - 36.0 g/dL    RDW 15.2 11.5 - 15.5 %   Platelets 275 150 - 400 K/uL   Neutrophils Relative % 84 %   Lymphocytes Relative 7 %   Monocytes Relative 8 %   Eosinophils Relative 1 %   Basophils Relative 0 %   Neutro Abs 12.0 (H) 1.7 - 7.7 K/uL   Lymphs Abs 1.0 0.7 - 4.0 K/uL   Monocytes Absolute 1.1 (H) 0.1 - 1.0 K/uL   Eosinophils Absolute 0.1 0.0 -  0.7 K/uL   Basophils Absolute 0.0 0.0 - 0.1 K/uL   Smear Review LARGE PLATELETS PRESENT   CK     Status: Abnormal   Collection Time: 07/02/16 11:33 PM  Result Value Ref Range   Total CK 399 (H) 38 - 234 U/L  Comprehensive metabolic panel     Status: Abnormal   Collection Time: 07/02/16 11:33 PM  Result Value Ref Range   Sodium 134 (L) 135 - 145 mmol/L   Potassium 5.2 (H) 3.5 - 5.1 mmol/L   Chloride 100 (L) 101 - 111 mmol/L   CO2 24 22 - 32 mmol/L   Glucose, Bld 210 (H) 65 - 99 mg/dL   BUN 13 6 - 20 mg/dL   Creatinine, Ser 0.76 0.44 - 1.00 mg/dL   Calcium 8.2 (L) 8.9 - 10.3 mg/dL   Total Protein 6.5 6.5 - 8.1 g/dL   Albumin 3.0 (L) 3.5 - 5.0 g/dL   AST 39 15 - 41 U/L   ALT 15 14 - 54 U/L   Alkaline Phosphatase 93 38 - 126 U/L   Total Bilirubin 2.2 (H) 0.3 - 1.2 mg/dL   GFR calc non Af Amer >60 >60 mL/min   GFR calc Af Amer >60 >60 mL/min   Anion gap 10 5 - 15  CBC     Status: Abnormal   Collection Time: 07/03/16 10:23 AM  Result Value Ref Range   WBC 11.6 (H) 4.0 - 10.5 K/uL   RBC 3.79 (L) 3.87 - 5.11 MIL/uL   Hemoglobin 11.5 (L) 12.0 - 15.0 g/dL   HCT 34.8 (L) 36.0 - 46.0 %   MCV 91.8 78.0 - 100.0 fL   MCH 30.3 26.0 - 34.0 pg   MCHC 33.0 30.0 - 36.0 g/dL   RDW 15.1 11.5 - 15.5 %   Platelets 271 150 - 400 K/uL  Creatinine, serum     Status: Abnormal   Collection Time: 07/03/16 10:23 AM  Result Value Ref Range   Creatinine, Ser 1.00 0.44 - 1.00 mg/dL   GFR calc non Af Amer 58 (L) >60 mL/min   GFR calc Af Amer >60 >60 mL/min    EKG: NSR, ST, QV2  IMAGING: Dg Chest Port 1 View  Result Date: 07/02/2016 CLINICAL DATA:  Status post  fall, with concern for chest injury. Initial encounter. EXAM: PORTABLE CHEST 1 VIEW COMPARISON:  None. FINDINGS: The lungs are well-aerated. Peripheral left midlung opacity could reflect pneumonia. Would correlate for any associated symptoms. There is no evidence of pleural effusion or pneumothorax. The cardiomediastinal silhouette is mildly enlarged. No acute osseous abnormalities are seen. IMPRESSION: 1. Peripheral left midlung opacity could reflect pneumonia. Would correlate for any associated symptoms. 2. Mild cardiomegaly. 3. No displaced rib fracture seen. Electronically Signed   By: Garald Balding M.D.   On: 07/02/2016 22:12   Dg Knee Complete 4 Views Right  Result Date: 07/02/2016 CLINICAL DATA:  Right knee pain following fall 2 days ago. Initial encounter. EXAM: RIGHT KNEE - COMPLETE 4+ VIEW COMPARISON:  None. FINDINGS: There is no evidence of acute fracture, subluxation or dislocation. A joint effusion is present. Tricompartmental degenerative changes are noted, mild to moderate in the lateral compartment. No focal bony lesions are present. IMPRESSION: Knee effusion without acute bony abnormality. Tricompartmental degenerative changes. Electronically Signed   By: Margarette Canada M.D.   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 Check TSH and echo. Soc Services consult.   Time Spent Directly with Patient: 59 minutes  Kerin Ransom, [place] beeper 07/03/2016, 11:40 AM   History and all data above reviewed.  Patient examined.  I agree with the findings as above.  The patient is status post a prolonged stay on the floor after slipping off of her mattress at home.  She  has atrial tach on her monitor.  She denies any palpitations.  She has been on beta blockers but was not able to take them for a couple of days.  She denies any syncope.  She has had no chest pain or SOB.   The patient exam reveals COR:RRR  ,  Lungs: Decreased  ,  Abd: Morbidly obese , Ext diffuse edema and chronic venous stasis changes.   .  All available labs, radiology testing, previous records reviewed. Agree with documented assessment and plan. Atrial tach:  Resart beta blocker.  Follow on telemetry. Check TSH.  No further work up.   Jeneen Rinks Tenicia Gural  12:15 PM  07/03/2016

## 2016-07-03 NOTE — ED Notes (Signed)
Pt refused azithromycin. Pt states the medication upsets her stomach severely due to her IBS. Pt informed that she has signs of possible pneumonia. Pt agreed to take rocephin infusion.

## 2016-07-03 NOTE — ED Notes (Signed)
ED Provider at bedside. 

## 2016-07-03 NOTE — ED Notes (Signed)
Unsuccessful lab draw x2, L hand RN Ikrame aware

## 2016-07-04 ENCOUNTER — Inpatient Hospital Stay (HOSPITAL_COMMUNITY): Payer: Medicare Other

## 2016-07-04 DIAGNOSIS — I10 Essential (primary) hypertension: Secondary | ICD-10-CM

## 2016-07-04 DIAGNOSIS — E1140 Type 2 diabetes mellitus with diabetic neuropathy, unspecified: Secondary | ICD-10-CM | POA: Diagnosis not present

## 2016-07-04 DIAGNOSIS — I471 Supraventricular tachycardia: Secondary | ICD-10-CM | POA: Diagnosis not present

## 2016-07-04 DIAGNOSIS — R627 Adult failure to thrive: Secondary | ICD-10-CM | POA: Diagnosis not present

## 2016-07-04 DIAGNOSIS — J189 Pneumonia, unspecified organism: Secondary | ICD-10-CM | POA: Diagnosis not present

## 2016-07-04 LAB — CBC
HCT: 31.9 % — ABNORMAL LOW (ref 36.0–46.0)
HEMOGLOBIN: 10.6 g/dL — AB (ref 12.0–15.0)
MCH: 30.8 pg (ref 26.0–34.0)
MCHC: 33.2 g/dL (ref 30.0–36.0)
MCV: 92.7 fL (ref 78.0–100.0)
PLATELETS: 242 10*3/uL (ref 150–400)
RBC: 3.44 MIL/uL — AB (ref 3.87–5.11)
RDW: 15.2 % (ref 11.5–15.5)
WBC: 11.1 10*3/uL — ABNORMAL HIGH (ref 4.0–10.5)

## 2016-07-04 LAB — BASIC METABOLIC PANEL
Anion gap: 10 (ref 5–15)
BUN: 20 mg/dL (ref 6–20)
CALCIUM: 8.2 mg/dL — AB (ref 8.9–10.3)
CHLORIDE: 100 mmol/L — AB (ref 101–111)
CO2: 26 mmol/L (ref 22–32)
CREATININE: 1 mg/dL (ref 0.44–1.00)
GFR calc non Af Amer: 58 mL/min — ABNORMAL LOW (ref >60)
GLUCOSE: 171 mg/dL — AB (ref 65–99)
Potassium: 3.4 mmol/L — ABNORMAL LOW (ref 3.5–5.1)
Sodium: 136 mmol/L (ref 135–145)

## 2016-07-04 LAB — TSH: TSH: 1.252 u[IU]/mL (ref 0.350–4.500)

## 2016-07-04 LAB — URINE CULTURE

## 2016-07-04 LAB — GLUCOSE, CAPILLARY
GLUCOSE-CAPILLARY: 212 mg/dL — AB (ref 65–99)
GLUCOSE-CAPILLARY: 197 mg/dL — AB (ref 65–99)
Glucose-Capillary: 188 mg/dL — ABNORMAL HIGH (ref 65–99)
Glucose-Capillary: 177 mg/dL — ABNORMAL HIGH (ref 65–99)

## 2016-07-04 LAB — HIV ANTIBODY (ROUTINE TESTING W REFLEX): HIV SCREEN 4TH GENERATION: NONREACTIVE

## 2016-07-04 MED ORDER — POTASSIUM CHLORIDE CRYS ER 20 MEQ PO TBCR
40.0000 meq | EXTENDED_RELEASE_TABLET | Freq: Once | ORAL | Status: AC
  Administered 2016-07-04: 40 meq via ORAL
  Filled 2016-07-04: qty 2

## 2016-07-04 NOTE — Evaluation (Signed)
Physical Therapy Evaluation Patient Details Name: AMAYRANI NICOLI MRN: [id number] DOB: 18-May-1951 Today's Date: 07/04/2016   History of Present Illness  66 yo female admitted with Pna, PAF. Hx of DM, HTN, IBS, neuropathy, obesity.   Clinical Impression  On eval, pt required Mod assist +2 for bed mobility and Min assist +2 for stand pivot and taking a few ambulatory steps in room with a RW. Pt c/o back and R LE pain with activity. She participated well with therapy. Recommend ST rehab at SNF. Will follow and progress activity as tolerated.     Follow Up Recommendations SNF    Equipment Recommendations  None recommended by PT    Recommendations for Other Services OT consult     Precautions / Restrictions Precautions Precautions: Fall Restrictions Weight Bearing Restrictions: No      Mobility  Bed Mobility Overal bed mobility: Needs Assistance Bed Mobility: Supine to Sit;Sit to Supine     Supine to sit: Mod assist;+2 for physical assistance;+2 for safety/equipment;HOB elevated Sit to supine: Mod assist;+2 for safety/equipment;HOB elevated   General bed mobility comments: Assist for trunk and bil LEs. Utilized bedpad to aid with scooting, positioning,. Increased time.   Transfers Overall transfer level: Needs assistance Equipment used: Rolling walker (2 wheeled) Transfers: Sit to/from [facility] Sit to Stand: Min assist;+2 physical assistance;+2 safety/equipment;From elevated surface Stand pivot transfers: Min assist;+2 safety/equipment;+2 physical assistance       General transfer comment: Assist to rise, stabilize, control descent. VCs safety, technique, hand placement. Stand pivot x2, bed<>bsc, with Rw.   Ambulation/Gait Ambulation/Gait assistance: Min assist;+2 physical assistance;+2 safety/equipment Ambulation Distance (Feet): 5 Feet Assistive device: Rolling walker (2 wheeled)       General Gait Details: Pt walked ~5 feet in room with RW.  Distance limited by pain, weakness.   Stairs            Wheelchair Mobility    Modified Rankin (Stroke Patients Only)       Balance Overall balance assessment: Needs assistance;History of Falls           Standing balance-[name]: Poor                               Pertinent Vitals/Pain Pain Assessment: Faces Faces Pain Scale: Hurts even more Pain Location: back, R LE Pain Descriptors / Indicators: Aching;Sore Pain Intervention(s): Limited activity within patient's tolerance;Repositioned    Home Living Family/patient expects to be discharged to:: Private residence Living Arrangements: Alone   Type of Home: Mobile home Home Access: Ramped entrance     Home Layout: One level Home Equipment: Environmental consultant - 2 wheels;Cane - single point;Crutches;Tub bench      Prior Function Level of Independence: Independent with assistive device(s)         Comments: wheelchair for cooking. "furniture walking" throughout home. still drives.      Hand Dominance        Extremity/Trunk Assessment   Upper Extremity Assessment Upper Extremity Assessment: Generalized weakness    Lower Extremity Assessment Lower Extremity Assessment: Generalized weakness    Cervical / Trunk Assessment Cervical / Trunk Assessment: Normal  Communication   Communication: No difficulties  Cognition Arousal/Alertness: Awake/alert Behavior During Therapy: WFL for tasks assessed/performed Overall Cognitive Status: Within Functional Limits for tasks assessed                      General Comments  Exercises     Assessment/Plan    PT Assessment Patient needs continued PT services  PT Problem List Decreased strength;Decreased mobility;Decreased activity tolerance;Decreased balance;Decreased knowledge of use of DME;Pain;Obesity          PT Treatment Interventions DME instruction;Therapeutic activities;Gait training;Therapeutic exercise;Patient/family  education;Functional mobility training;Balance training    PT Goals (Current goals can be found in the Care Plan section)  Acute Rehab PT Goals Patient Stated Goal: less pain.  PT Goal Formulation: With patient Time For Goal Achievement: 07/18/16 Potential to Achieve Goals: Good    Frequency Min 3X/week   Barriers to discharge        Co-evaluation               End of Session   Activity Tolerance: Patient limited by pain Patient left: in bed;with call bell/phone within reach           Time: [id number] PT Time Calculation (min) (ACUTE ONLY): 28 min   Charges:   PT Evaluation $PT Eval Low Complexity: 1 Procedure PT Treatments $Therapeutic Activity: 8-22 mins   PT G Codes:        Weston Anna, MPT Pager: [phone]

## 2016-07-04 NOTE — Progress Notes (Signed)
Progress Note  Patient Name: Jody Simpson Date of Encounter: 07/04/2016  Primary Cardiologist: Dr Percival Spanish (new)  Subjective   Feels better, no SOB or chest pain   Inpatient Medications    Scheduled Meds: . allopurinol  300 mg Oral Daily  . atenolol  50 mg Oral BID  . azithromycin  500 mg Intravenous Q24H  . cefTRIAXone (ROCEPHIN)  IV  1 g Intravenous Q24H  . heparin  5,000 Units Subcutaneous Q8H  . insulin aspart  0-5 Units Subcutaneous QHS  . insulin aspart  0-9 Units Subcutaneous TID WC  . potassium chloride  40 mEq Oral Once   Continuous Infusions:  PRN Meds: acetaminophen **OR** acetaminophen, albuterol   Vital Signs    Vitals:   07/03/16 1343 07/03/16 1554 07/03/16 2048 07/04/16 0600  BP: 117/74 128/74 (!) 106/57 (!) 131/50  Pulse: 106 117 79 72  Resp: 22 (!) 29 20 20   Temp:   98.2 F (36.8 C) 97.9 F (36.6 C)  TempSrc:   Oral Oral  SpO2: 96% 96% 97% 97%  Weight:      Height:        Intake/Output Summary (Last 24 hours) at 07/04/16 0755 Last data filed at 07/04/16 [id number]  Gross per 24 hour  Intake              300 ml  Output                0 ml  Net              300 ml   Filed Weights   07/02/16 2022  Weight: (!) 319 lb (144.7 kg)    Telemetry    NSR now but runs of PAF earlier - Personally Reviewed  ECG    NSR. Poor ant RW - Personally Reviewed  Physical Exam   GEN: No acute distress.  Morbidly obese Neck: No JVD Cardiac: RRR, no murmurs, rubs, or gallops.  Respiratory: Clear to auscultation bilaterally. Skin: warm, pale, dry Neuro:  Nonfocal  Psych: Normal affect   Labs    Chemistry Recent Labs Lab 07/02/16 2314 07/02/16 2333 07/03/16 1023 07/04/16 0433  NA 134* 134*  --  136  K 4.8 5.2*  --  3.4*  CL 98* 100*  --  100*  CO2  --  24  --  26  GLUCOSE 201* 210*  --  171*  BUN 13 13  --  20  CREATININE 0.80 0.76 1.00 1.00  CALCIUM  --  8.2*  --  8.2*  PROT  --  6.5  --   --   ALBUMIN  --  3.0*  --   --   AST  --  39   --   --   ALT  --  15  --   --   ALKPHOS  --  93  --   --   BILITOT  --  2.2*  --   --   GFRNONAA  --  >60 58* 58*  GFRAA  --  >60 >60 >60  ANIONGAP  --  10  --  10     Hematology Recent Labs Lab 07/02/16 2333 07/03/16 1023 07/04/16 0433  WBC 14.2* 11.6* 11.1*  RBC 4.24 3.79* 3.44*  HGB 13.4 11.5* 10.6*  HCT 38.5 34.8* 31.9*  MCV 90.8 91.8 92.7  MCH 31.6 30.3 30.8  MCHC 34.8 33.0 33.2  RDW 15.2 15.1 15.2  PLT 275 271 242    Cardiac EnzymesNo  results for input(s): TROPONINI in the last 168 hours. No results for input(s): TROPIPOC in the last 168 hours.   BNPNo results for input(s): BNP, PROBNP in the last 168 hours.   DDimer No results for input(s): DDIMER in the last 168 hours.   Radiology    Dg Chest Port 1 View  Result Date: 07/02/2016 CLINICAL DATA:  Status post fall, with concern for chest injury. Initial encounter. EXAM: PORTABLE CHEST 1 VIEW COMPARISON:  None. FINDINGS: The lungs are well-aerated. Peripheral left midlung opacity could reflect pneumonia. Would correlate for any associated symptoms. There is no evidence of pleural effusion or pneumothorax. The cardiomediastinal silhouette is mildly enlarged. No acute osseous abnormalities are seen. IMPRESSION: 1. Peripheral left midlung opacity could reflect pneumonia. Would correlate for any associated symptoms. 2. Mild cardiomegaly. 3. No displaced rib fracture seen. Electronically Signed   By: Garald Balding M.D.   On: 07/02/2016 22:12   Dg Knee Complete 4 Views Right  Result Date: 07/02/2016 CLINICAL DATA:  Right knee pain following fall 2 days ago. Initial encounter. EXAM: RIGHT KNEE - COMPLETE 4+ VIEW COMPARISON:  None. FINDINGS: There is no evidence of acute fracture, subluxation or dislocation. A joint effusion is present. Tricompartmental degenerative changes are noted, mild to moderate in the lateral compartment. No focal bony lesions are present. IMPRESSION: Knee effusion without acute bony abnormality.  Tricompartmental degenerative changes. Electronically Signed   By: Margarette Canada M.D.   On: 07/02/2016 21:27    Cardiac Studies   CK [phone]  Patient Profile     66 y.o.morbidly obese female (BMI 51) with a history of NIDDM, HTN, and HLD admitted to ED at [facility] 07/03/16 after she fell at home and was unable to get up or take her medications x 36-49 hrs. In the ED she was noted to have runs of PAT. Since admission she has had PAF runs that have resolved with resumption of her beta blocker.   Assessment & Plan    1.- PAT/PAF- resolved with resumption of beta blocker  2. Morbid obesity- limited mobility, she has a weak Lt leg secondary to neuropathy and prior ankle surgeries. She does drive but has to shop with a walker or wheelchair.   3. DM- per primary service  4. HTN- Controlled on Tenormin  5. HLD- history of statin intolerance  6. Poor living conditions: family concerned about her living conditions. Social Worker consulted.  Plan-   Will review telemetry with MD. She is a [place] 3 for sex, HTN, and DM- will discuss need for anticoagulation.  Also, may want to consider an echo for LA size. Replace K+ this am  Signed, Kerin Ransom, PA-C  07/04/2016, 7:55 AM    History and all data above reviewed.  Patient examined.  I agree with the findings as above.  The patient exam reveals COR:RRR  ,  Lungs: Clear  ,  Abd: Positive bowel sounds, no rebound no guarding , Ext Mild edema  .  All available labs, radiology testing, previous records reviewed. Agree with documented assessment and plan. Atrial arrhythmias.  She has atrial tach and a few pauses.  I am not convinced that this is fib.  Also, with her social history and difficulty with fall on admission, I think that long term anticoagulation would be problematic.  No suggestion for DOAC or warfarin at this point.    Jeneen Rinks [place] Divis  11:06 AM  07/04/2016

## 2016-07-04 NOTE — Progress Notes (Signed)
Dr. Olevia Bowens informed writer to continue to monitor patient.  No new orders received.

## 2016-07-04 NOTE — Care Management Obs Status (Signed)
[place] NOTIFICATION   Patient Details  Name: PRANJAL MIGLIACCIO MRN: [id number] Date of Birth: 11-04-1950   Medicare Observation Status Notification Given:  Yes    MahabirJuliann Pulse, RN 07/04/2016, 1:17 PM

## 2016-07-04 NOTE — Progress Notes (Signed)
[facility] PROGRESS NOTE    Progress Note  Jody Simpson  [id number] DOB: 12-02-1950 DOA: 07/02/2016 PCP: Gerrit Heck, MD     Brief Narrative:   Jody Simpson is an 66 y.o. female past medical history of diabetes mellitus essential hypertension history of renal cell cancer comes in for feeling weak 24 hours prior to admission. She relates she slipped wall trying to get out of bed she did not hit her head, chest x-ray in the ED showed a left lower lobe pneumonia she was started empirically on community-acquired pneumonia treatment. In the ED she had irregular rhythms on telemetry so cardiology was consulted.  Assessment/Plan:   CAP: She has a mild leukocytosis with no fevers, chest x-ray showed possible infiltrates. PA and lateral showed left lower lobe infiltrate.  She was started empirically on IV Rocephin and azithromycin. Nitrates were negative for bacteria with 6-30 red blood cells in urine. Unlikely a UTI, urine cultures are pending.  Mild hyperkalemia: Assessment foci which she was given IV fluid hydration. Now She is hypokalemic. CK was 400.  Mild rhabdomyolysis: Reason with IV fluid hydration physical therapy evaluation is pending.  Essential hypertension: Hold ACE inhibitor and hydrochlorothiazide. Continue atenolol.  Sinus arrhythmia: Unclear whether it's A. fib, cardiology was consulted recommended to resume beta blockers. Cardiology thinks this is probably atrial tachycardia, in the setting of not being able to take her beta blocker.   Gout - Continue with allopurinol  Has weakness/fall/right knee pain - X-ray of right knee with no evidence of fracture, only some effusion, will consult PT   DVT prophylaxis: lovenox Family Communication:none Disposition Plan/Barrier to D/C: home in am Code Status:     Code Status Orders        Start     Ordered   07/03/16 0733  Full code  Continuous     07/03/16 0733    Code Status  History    Date Active Date Inactive Code Status Order ID Comments User Context   This patient has a current code status but no historical code status.        IV Access:    Peripheral IV   Procedures and diagnostic studies:   Dg Chest Port 1 View  Result Date: 07/02/2016 CLINICAL DATA:  Status post fall, with concern for chest injury. Initial encounter. EXAM: PORTABLE CHEST 1 VIEW COMPARISON:  None. FINDINGS: The lungs are well-aerated. Peripheral left midlung opacity could reflect pneumonia. Would correlate for any associated symptoms. There is no evidence of pleural effusion or pneumothorax. The cardiomediastinal silhouette is mildly enlarged. No acute osseous abnormalities are seen. IMPRESSION: 1. Peripheral left midlung opacity could reflect pneumonia. Would correlate for any associated symptoms. 2. Mild cardiomegaly. 3. No displaced rib fracture seen. Electronically Signed   By: Garald Balding M.D.   On: 07/02/2016 22:12   Dg Knee Complete 4 Views Right  Result Date: 07/02/2016 CLINICAL DATA:  Right knee pain following fall 2 days ago. Initial encounter. EXAM: RIGHT KNEE - COMPLETE 4+ VIEW COMPARISON:  None. FINDINGS: There is no evidence of acute fracture, subluxation or dislocation. A joint effusion is present. Tricompartmental degenerative changes are noted, mild to moderate in the lateral compartment. No focal bony lesions are present. IMPRESSION: Knee effusion without acute bony abnormality. Tricompartmental degenerative changes. Electronically Signed   By: Margarette Canada M.D.   On: 07/02/2016 21:27     Medical Consultants:    None.  Anti-Infectives:   Rocephin and azithromycin.  Subjective:  Jody Simpson she relates her shortness of breath is better.  Objective:    Vitals:   07/03/16 1343 07/03/16 1554 07/03/16 2048 07/04/16 0600  BP: 117/74 128/74 (!) 106/57 (!) 131/50  Pulse: 106 117 79 72  Resp: 22 (!) 29 20 20   Temp:   98.2 F (36.8 C) 97.9 F (36.6  C)  TempSrc:   Oral Oral  SpO2: 96% 96% 97% 97%  Weight:      Height:        Intake/Output Summary (Last 24 hours) at 07/04/16 [id number] Last data filed at 07/04/16 [id number]  Gross per 24 hour  Intake              300 ml  Output                0 ml  Net              300 ml   Filed Weights   07/02/16 2022  Weight: (!) 144.7 kg (319 lb)    Exam: General exam: In no acute distress. Respiratory system: Good air movement and clear to auscultation. Cardiovascular system: S1 & S2 heard, RRR. No JVD. Gastrointestinal system: Abdomen is nondistended, soft and nontender.  Extremities: No pedal edema. Skin: No rashes, lesions or ulcers Psychiatry: Judgement and insight appear normal. Mood & affect appropriate.    Data Reviewed:    Labs: Basic Metabolic Panel:  Recent Labs Lab 07/02/16 2314 07/02/16 2333 07/03/16 1023 07/04/16 0433  NA 134* 134*  --  136  K 4.8 5.2*  --  3.4*  CL 98* 100*  --  100*  CO2  --  24  --  26  GLUCOSE 201* 210*  --  171*  BUN 13 13  --  20  CREATININE 0.80 0.76 1.00 1.00  CALCIUM  --  8.2*  --  8.2*   GFR Estimated Creatinine Clearance: 83.9 mL/min (by C-G formula based on SCr of 1 mg/dL). Liver Function Tests:  Recent Labs Lab 07/02/16 2333  AST 39  ALT 15  ALKPHOS 93  BILITOT 2.2*  PROT 6.5  ALBUMIN 3.0*   No results for input(s): LIPASE, AMYLASE in the last 168 hours. No results for input(s): AMMONIA in the last 168 hours. Coagulation profile No results for input(s): INR, PROTIME in the last 168 hours.  CBC:  Recent Labs Lab 07/02/16 2314 07/02/16 2333 07/03/16 1023 07/04/16 0433  WBC  --  14.2* 11.6* 11.1*  NEUTROABS  --  12.0*  --   --   HGB 9.9* 13.4 11.5* 10.6*  HCT 29.0* 38.5 34.8* 31.9*  MCV  --  90.8 91.8 92.7  PLT  --  275 271 242   Cardiac Enzymes:  Recent Labs Lab 07/02/16 2333 07/03/16 1641  CKTOTAL 399* 414*   BNP (last 3 results) No results for input(s): PROBNP in the last 8760 hours. CBG:  Recent  Labs Lab 07/03/16 1805 07/03/16 2051 07/04/16 0755  GLUCAP 162* 183* 188*   D-Dimer: No results for input(s): DDIMER in the last 72 hours. Hgb A1c: No results for input(s): HGBA1C in the last 72 hours. Lipid Profile: No results for input(s): CHOL, HDL, LDLCALC, TRIG, CHOLHDL, LDLDIRECT in the last 72 hours. Thyroid function studies:  Recent Labs  07/04/16 0433  TSH 1.252   Anemia work up: No results for input(s): VITAMINB12, FOLATE, FERRITIN, TIBC, IRON, RETICCTPCT in the last 72 hours. Sepsis Labs:  Recent Labs Lab 07/02/16 2314 07/02/16 2333 07/03/16 1023 07/04/16  0433  WBC  --  14.2* 11.6* 11.1*  LATICACIDVEN 1.90  --   --   --    Microbiology No results found for this or any previous visit (from the past 240 hour(s)).   Medications:   . allopurinol  300 mg Oral Daily  . atenolol  50 mg Oral BID  . azithromycin  500 mg Intravenous Q24H  . cefTRIAXone (ROCEPHIN)  IV  1 g Intravenous Q24H  . heparin  5,000 Units Subcutaneous Q8H  . insulin aspart  0-5 Units Subcutaneous QHS  . insulin aspart  0-9 Units Subcutaneous TID WC  . potassium chloride  40 mEq Oral Once   Continuous Infusions:  Time spent: 25 min   LOS: 1 day   Charlynne Cousins  [facility] Pager [phone]  *Please refer to [email or web address], password [email or web address] to get updated schedule on who will round on this patient, as hospitalists switch teams weekly. If 7PM-7AM, please contact night-coverage at [email or web address], password [email or web address] for any overnight needs.  07/04/2016, 8:22 AM

## 2016-07-04 NOTE — Care Management Note (Signed)
Case Management Note  Patient Details  Name: Jody Simpson MRN: [id number] Date of Birth: 10-16-1950  Subjective/Objective:  66 y/o f admitted w/CAP. From home. PT cons-await recc.CM/CSW following.                   Action/Plan:d/c plan home.   Expected Discharge Date:   (unknown)               Expected Discharge Plan:  [place]  In-House Referral:  Clinical Social Work  Discharge planning Services  CM Consult  Post Acute Care Choice:    Choice offered to:     DME Arranged:    DME Agency:     HH Arranged:    [place] Agency:     Status of Service:  In process, will continue to follow  If discussed at Long Length of Stay Meetings, dates discussed:    Additional Comments:  Dessa Phi, RN 07/04/2016, 11:19 AM

## 2016-07-04 NOTE — Clinical Social Work Placement (Addendum)
CSW confirmed with Rollene Fare at [facility] that they would be able to take patient over the weekend if ready. [facility] Medicare authorization obtained (#: [id number], next review date = 2/5, rug level = RVC) [place] aware.   Please call weekend CSW (ph#: [phone]) to facilitate discharge.      Raynaldo Opitz, [facility] Clinical Social Worker cell #: [phone]   CLINICAL SOCIAL WORK PLACEMENT  NOTE  Date:  07/04/2016  Patient Details  Name: Jody Simpson MRN: [id number] Date of Birth: May 10, 1951  Clinical Social Work is seeking post-discharge placement for this patient at the [place] level of care (*CSW will initial, date and re-position this form in  chart as items are completed):  Yes   Patient/family provided with [place] Work Department's list of facilities offering this level of care within the geographic area requested by the patient (or if unable, by the patient's family).  Yes   Patient/family informed of their freedom to choose among providers that offer the needed level of care, that participate in Medicare, Medicaid or managed care program needed by the patient, have an available bed and are willing to accept the patient.  Yes   Patient/family informed of [place]'s ownership interest in [facility] and [facility], as well as of the fact that they are under no obligation to receive care at these facilities.  PASRR submitted to EDS on 07/04/16     PASRR number received on 07/04/16     Existing PASRR number confirmed on       FL2 transmitted to all facilities in geographic area requested by pt/family on 07/04/16     FL2 transmitted to all facilities within larger geographic area on       Patient informed that his/her managed care company has contracts with or will negotiate with certain facilities, including the following:        Yes   Patient/family informed of bed offers received.  Patient  chooses bed at [facility]     Physician recommends and patient chooses bed at      Patient to be transferred to [facility] on  .  Patient to be transferred to facility by       Patient family notified on   of transfer.  Name of family member notified:        PHYSICIAN       Additional Comment:    _______________________________________________ Standley Brooking, LCSW 07/04/2016, 1:59 PM

## 2016-07-04 NOTE — NC FL2 (Signed)
[place] LEVEL OF CARE SCREENING TOOL     IDENTIFICATION  Patient Name: Jody Simpson Birthdate: [birth year] Sex: female Admission Date (Current Location): 07/02/2016  [facility] and [place] Number:  Herbalist and Address:  [facility],  [address], [place]      Provider Number: [id number]  Attending Physician Name and Address:  Charlynne Cousins, MD  Relative Name and Phone Number:       Current Level of Care: Hospital Recommended Level of Care: [place] Prior Approval Number:    Date Approved/Denied:   PASRR Number: [id number]  Discharge Plan: SNF    Current Diagnoses: Patient Active Problem List   Diagnosis Date Noted  . CAP (community acquired pneumonia) 07/03/2016  . Obesity-BMI 51 07/03/2016  . Fall-down at home x 48 hrs 07/03/2016  . PAT (paroxysmal atrial tachycardia) ([place]) 07/03/2016  . COLONIC POLYPS 09/08/2007  . Gout 09/08/2007  . GASTROESOPHAGEAL REFLUX DISEASE 09/08/2007  . IRRITABLE BOWEL SYNDROME 09/08/2007  . 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 Hyperlipemia 07/28/2006  . 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 Diabetes ([place]) 07/21/2006  . HYPERTENSION, BENIGN ESSENTIAL 07/21/2006  . DERMATITIS, ATOPIC 07/21/2006  . 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    Orientation RESPIRATION BLADDER Height & Weight     Self, Time, Situation, Place  Normal Continent Weight: (!) 319 lb (144.7 kg) Height:  5\' 6"  (167.6 cm)  BEHAVIORAL SYMPTOMS/MOOD NEUROLOGICAL BOWEL NUTRITION STATUS      Continent Diet (Carb Modified)  AMBULATORY STATUS COMMUNICATION OF NEEDS Skin   Extensive Assist Verbally Normal                       Personal Care Assistance Level of Assistance  Bathing, Dressing Bathing Assistance: Limited assistance   Dressing Assistance: Limited assistance     Functional Limitations Info             SPECIAL CARE FACTORS FREQUENCY  PT (By  licensed PT), OT (By licensed OT)     PT Frequency: 5 OT Frequency: 5            Contractures      Additional Factors Info  Code Status, Allergies Code Status Info: Fullcode Allergies Info: Aspirin, Cholestyramine, Codeine, Diazepam, Hydrocodone, Iodine, Latex, Meperidine Hcl, Metformin, Naproxen Sodium, Penicillins, Pravastatin Sodium, Sitagliptin Phosphate           Current Medications (07/04/2016):  This is the current hospital active medication list Current Facility-Administered Medications  Medication Dose Route Frequency Provider Last Rate Last Dose  . acetaminophen (TYLENOL) tablet 650 mg  650 mg Oral Q6H PRN Albertine Patricia, MD       Or  . acetaminophen (TYLENOL) suppository 650 mg  650 mg Rectal Q6H PRN Silver Huguenin Elgergawy, MD      . albuterol (PROVENTIL) (2.5 MG/3ML) 0.083% nebulizer solution 2.5 mg  2.5 mg Nebulization Q2H PRN Albertine Patricia, MD      . allopurinol (ZYLOPRIM) tablet 300 mg  300 mg Oral Daily Albertine Patricia, MD   300 mg at 07/04/16 0932  . atenolol (TENORMIN) tablet 50 mg  50 mg Oral BID Albertine Patricia, MD   50 mg at 07/04/16 0932  . azithromycin (ZITHROMAX) 500 mg in dextrose 5 % 250 mL IVPB  500 mg Intravenous Q24H Albertine Patricia, MD   500 mg at 07/04/16 [id number]  . cefTRIAXone (ROCEPHIN) 1 g in dextrose 5 % 50  mL IVPB  1 g Intravenous Q24H Albertine Patricia, MD   1 g at 07/04/16 0511  . heparin injection 5,000 Units  5,000 Units Subcutaneous Q8H Albertine Patricia, MD   5,000 Units at 07/04/16 [phone]  . insulin aspart (novoLOG) injection 0-5 Units  0-5 Units Subcutaneous QHS Dawood S Elgergawy, MD      . insulin aspart (novoLOG) injection 0-9 Units  0-9 Units Subcutaneous TID WC Albertine Patricia, MD   2 Units at 07/04/16 [id number]     Discharge Medications: Please see discharge summary for a list of discharge medications.  Relevant Imaging Results:  Relevant Lab Results:   Additional Information SSN: [id number]  Standley Brooking,  LCSW

## 2016-07-04 NOTE — Progress Notes (Signed)
Nurse tech, Skeet Simmer, taking patient's vital signs at bedside.  Patient complains and states " I am seeing double." Nurse tech informs Probation officer. Patient tells nurse she is having "double vision." No other complaints from patient.  Vital signs are temp 99.3, pulse is 73, blood pressure is 140/57, and oxygen saturation is 95% on room air.  Dr. Olevia Bowens notified via text page.  Will continue to monitor patient.

## 2016-07-04 NOTE — Care Management CC44 (Signed)
Condition Code 44 Documentation Completed  Patient Details  Name: Jody Simpson MRN: [id number] Date of Birth: 17-Sep-1950   Condition Code 44 given:  Yes Patient signature on Condition Code 44 notice:  Yes Documentation of 2 MD's agreement:  Yes Code 44 added to claim:  Yes    Dessa Phi, RN 07/04/2016, 1:18 PM

## 2016-07-04 NOTE — Care Management Note (Signed)
Case Management Note  Patient Details  Name: Jody Simpson MRN: [id number] Date of Birth: February 29, 1951  Subjective/Objective: PT recc SNF. CSW already following.                   Action/Plan:d/c SNF.   Expected Discharge Date:   (unknown)               Expected Discharge Plan:  Skilled Nursing Facility  In-House Referral:  Clinical Social Work  Discharge planning Services  CM Consult  Post Acute Care Choice:    Choice offered to:     DME Arranged:    DME Agency:     HH Arranged:    [place] Agency:     Status of Service:  In process, will continue to follow  If discussed at Long Length of Stay Meetings, dates discussed:    Additional Comments:  Dessa Phi, RN 07/04/2016, 11:21 AM

## 2016-07-04 NOTE — Clinical Social Work Placement (Signed)
CSW spoke with patient's brother Arnette Norris (cell#: [phone]) to confirm plans for SNF, pending PT evaluation & [facility] Medicare authorization. Patient's brother works for [employer]  and is familiar with some facilities - CSW sent information out to [facility] and will follow-up with bed offers this afternoon.    Raynaldo Opitz, [facility] Clinical Social Worker cell #: [phone]    CLINICAL SOCIAL WORK PLACEMENT  NOTE  Date:  07/04/2016  Patient Details  Name: Jody Simpson MRN: [id number] Date of Birth: 1950-07-30  Clinical Social Work is seeking post-discharge placement for this patient at the [place] level of care (*CSW will initial, date and re-position this form in  chart as items are completed):  Yes   Patient/family provided with [place] Work Department's list of facilities offering this level of care within the geographic area requested by the patient (or if unable, by the patient's family).  Yes   Patient/family informed of their freedom to choose among providers that offer the needed level of care, that participate in Medicare, Medicaid or managed care program needed by the patient, have an available bed and are willing to accept the patient.  Yes   Patient/family informed of [place]'s ownership interest in [facility] and [facility], as well as of the fact that they are under no obligation to receive care at these facilities.  PASRR submitted to EDS on 07/04/16     PASRR number received on 07/04/16     Existing PASRR number confirmed on       FL2 transmitted to all facilities in geographic area requested by pt/family on 07/04/16     FL2 transmitted to all facilities within larger geographic area on       Patient informed that his/her managed care company has contracts with or will negotiate with certain facilities, including the following:            Patient/family informed of bed  offers received.  Patient chooses bed at       Physician recommends and patient chooses bed at      Patient to be transferred to   on  .  Patient to be transferred to facility by       Patient family notified on   of transfer.  Name of family member notified:        PHYSICIAN       Additional Comment:    _______________________________________________ Standley Brooking, LCSW 07/04/2016, 10:43 AM

## 2016-07-05 DIAGNOSIS — I471 Supraventricular tachycardia: Secondary | ICD-10-CM | POA: Diagnosis not present

## 2016-07-05 DIAGNOSIS — E1140 Type 2 diabetes mellitus with diabetic neuropathy, unspecified: Secondary | ICD-10-CM | POA: Diagnosis not present

## 2016-07-05 DIAGNOSIS — I10 Essential (primary) hypertension: Secondary | ICD-10-CM | POA: Diagnosis not present

## 2016-07-05 DIAGNOSIS — J189 Pneumonia, unspecified organism: Secondary | ICD-10-CM | POA: Diagnosis not present

## 2016-07-05 DIAGNOSIS — R627 Adult failure to thrive: Secondary | ICD-10-CM | POA: Diagnosis not present

## 2016-07-05 LAB — GLUCOSE, CAPILLARY: Glucose-Capillary: 191 mg/dL — ABNORMAL HIGH (ref 65–99)

## 2016-07-05 MED ORDER — LISINOPRIL-HYDROCHLOROTHIAZIDE 20-25 MG PO TABS: 1.0000 | ORAL_TABLET | Freq: Every day | ORAL | Status: DC

## 2016-07-05 MED ORDER — LISINOPRIL 20 MG PO TABS
20.0000 mg | ORAL_TABLET | Freq: Every day | ORAL | Status: DC
  Administered 2016-07-05: 20 mg via ORAL
  Filled 2016-07-05: qty 1

## 2016-07-05 MED ORDER — LEVOFLOXACIN 750 MG PO TABS: 750.0000 mg | ORAL_TABLET | Freq: Every day | ORAL | 0 refills | Status: DC

## 2016-07-05 MED ORDER — HYDROCHLOROTHIAZIDE 25 MG PO TABS
25.0000 mg | ORAL_TABLET | Freq: Every day | ORAL | Status: DC
  Administered 2016-07-05: 25 mg via ORAL
  Filled 2016-07-05: qty 1

## 2016-07-05 MED ORDER — PIOGLITAZONE HCL 15 MG PO TABS
15.0000 mg | ORAL_TABLET | Freq: Every day | ORAL | Status: DC
  Filled 2016-07-05: qty 1

## 2016-07-05 MED ORDER — GLIPIZIDE 10 MG PO TABS
10.0000 mg | ORAL_TABLET | Freq: Two times a day (BID) | ORAL | Status: DC
  Administered 2016-07-05: 10 mg via ORAL
  Filled 2016-07-05: qty 1

## 2016-07-05 NOTE — Progress Notes (Signed)
Attempted to call report to [facility] 3 more times with no answer.

## 2016-07-05 NOTE — Progress Notes (Signed)
Attempted to call report to [facility] no answer will reattempt.

## 2016-07-05 NOTE — Clinical Social Work Placement (Signed)
   CLINICAL SOCIAL WORK PLACEMENT  NOTE  Date:  07/05/2016  Patient Details  Name: Jody Simpson MRN: [id number] Date of Birth: [birth year]  Clinical Social Work is seeking post-discharge placement for this patient at the [place] level of care (*CSW will initial, date and re-position this form in  chart as items are completed):  Yes   Patient/family provided with [place] Work Department's list of facilities offering this level of care within the geographic area requested by the patient (or if unable, by the patient's family).  Yes   Patient/family informed of their freedom to choose among providers that offer the needed level of care, that participate in Medicare, Medicaid or managed care program needed by the patient, have an available bed and are willing to accept the patient.  Yes   Patient/family informed of [place]'s ownership interest in [facility] and [facility], as well as of the fact that they are under no obligation to receive care at these facilities.  PASRR submitted to EDS on 07/04/16     PASRR number received on 07/04/16     Existing PASRR number confirmed on       FL2 transmitted to all facilities in geographic area requested by pt/family on 07/04/16     FL2 transmitted to all facilities within larger geographic area on       Patient informed that his/her managed care company has contracts with or will negotiate with certain facilities, including the following:        Yes   Patient/family informed of bed offers received.  Patient chooses bed at [facility]     Physician recommends and patient chooses bed at      Patient to be transferred to [facility] on 07/05/16.  Patient to be transferred to facility by Ambulance [facility])     Patient family notified on 07/05/16 of transfer.  Name of family member notified:  Patient states that she has called her brother/family     PHYSICIAN       Additional Comment: RN  notified to call report to SNF; patient is pleased with d/c to SNF. States she lives alone and is independent. Feels this has been hard for her as she is having to tell family where her clothes and personal items are.  She wants to rehab and return home asap.  Facility is aware of d/c and CSW sent DC summary. No further CSW needs indicated. CSW signing off.     _______________________________________________ Williemae Area, LCSW 07/05/2016, 10:29 AM

## 2016-07-05 NOTE — Discharge Summary (Signed)
Physician Discharge Summary  Jody Simpson [id number] DOB: 1950-07-18 DOA: 07/02/2016  PCP: Gerrit Heck, MD  Admit date: 07/02/2016 Discharge date: 07/05/2016  Admitted From: home Disposition:  SNF  Recommendations for Outpatient Follow-up:  1. Follow up with PCP in 1-2 weeks 2. Will go SNF [facility].   Home Health:no Equipment/Devices:None  Discharge Condition:stable CODE STATUS:full Diet recommendation:Carb Modified  Brief/Interim Summary: 66 y.o. female, His past medical history of diabetes mellitus, hypertension, neuropathy, gout, history of renal cell cancer, patient presents with complaints of generalized weakness, and knee pain, status post falling yesterday, reports she's been feeling weak over last 24 hours, report she had a fall yesterday, where she slipped while getting out of the bed, denies any head trauma, reports that she did rest her knee, no loss of consciousness, reports she spent the night on the floor as she could not stand up, family found her on the floor, which they called EMS  Discharge Diagnoses:  Active Problems:   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 He became afebrile and leukocytosis is improved. . Nitrates were negative for bacteria with 6-30 red blood cells in urine. Unlikely a UTI. She was changed to oral Levaquin she will continue as an outpatient.  Mild hyperkalemia: Resolved with repletion.  Nontraumatic Mild rhabdomyolysis: Likely due to being on the floor. Now resolved.  Essential hypertension: Resume medications or changes were made.  Sinus arrhythmia: Cardiology was  consulted Cardiology thinks this is probably atrial tachycardia, in the setting of not being able to take her beta blocker. She's not a candidate for anticoagulation due to past medical history of multiple falls.  Gout - Continue with allopurinol  Discharge Instructions  Discharge Instructions    Diet - low sodium heart healthy    Complete by:  As directed    Increase activity slowly    Complete by:  As directed      Allergies as of 07/05/2016      Reactions   Aspirin    REACTION: stomach irritation   Cholestyramine    REACTION: Diarrhea and nausea   Codeine    REACTION: stomach irritant   Diazepam    REACTION: hyper   Hydrocodone    Iodine    Latex    Meperidine Hcl    Metformin    REACTION: Diarrhea   Naproxen Sodium    REACTION: stomach irritant   Penicillins    Pravastatin Sodium    REACTION: Flu like symptoms   Sitagliptin Phosphate    REACTION: Caused elevated CBGs      Medication List    STOP taking these medications   sulfamethoxazole-trimethoprim 800-160 MG tablet Commonly known as:  BACTRIM DS,SEPTRA DS   VICTOZA 18 MG/3ML Sopn Generic drug:  liraglutide     TAKE these medications   allopurinol 300 MG tablet Commonly known as:  ZYLOPRIM TK 1 T PO QD   atenolol 50 MG tablet Commonly known as:  TENORMIN Take 50 mg by mouth 2 (two) times daily.   glipiZIDE 10 MG tablet Commonly known as:  GLUCOTROL TK 1 T PO BID   levofloxacin 750 MG tablet Commonly known as:  LEVAQUIN Take 1 tablet (750 mg total) by mouth daily.   lisinopril-hydrochlorothiazide 20-25 MG tablet Commonly known as:  PRINZIDE,ZESTORETIC TK 1 T PO QD   loratadine 10 MG tablet Commonly known as:  CLARITIN Take 10 mg by mouth daily.   pioglitazone 15 MG tablet Commonly known as:  ACTOS TK 1 T PO QD       Allergies  Allergen Reactions  . Aspirin     REACTION: stomach irritation  . Cholestyramine     REACTION: Diarrhea and nausea  . Codeine     REACTION: stomach  irritant  . Diazepam     REACTION: hyper  . Hydrocodone   . Iodine   . Latex   . Meperidine Hcl   . Metformin     REACTION: Diarrhea  . Naproxen Sodium     REACTION: stomach irritant  . Penicillins   . Pravastatin Sodium     REACTION: Flu like symptoms  . Sitagliptin Phosphate     REACTION: Caused elevated CBGs    Consultations:  Cardiology   Procedures/Studies: Dg Chest 2 View  Result Date: 07/04/2016 CLINICAL DATA:  Shortness of breath, recent fall EXAM: CHEST  2 VIEW COMPARISON:  07/02/2016 FINDINGS: Cardiomegaly. Left base atelectasis. Right lung is clear. No effusions or pneumothorax. No acute bony abnormality. IMPRESSION: Cardiomegaly.  Left base atelectasis. Electronically Signed   By: Rolm Baptise M.D.   On: 07/04/2016 09:36   Dg Chest Port 1 View  Result Date: 07/02/2016 CLINICAL DATA:  Status post fall, with concern for chest injury. Initial encounter. EXAM: PORTABLE CHEST 1 VIEW COMPARISON:  None. FINDINGS: The lungs are well-aerated. Peripheral left midlung opacity could reflect pneumonia. Would correlate for any associated symptoms. There is no evidence of pleural effusion or pneumothorax. The cardiomediastinal silhouette is mildly enlarged. No acute osseous abnormalities are seen. IMPRESSION: 1. Peripheral left midlung opacity could reflect pneumonia. Would correlate for any associated symptoms. 2. Mild cardiomegaly. 3. No displaced rib fracture seen. Electronically Signed   By: Garald Balding M.D.   On: 07/02/2016 22:12   Dg Knee Complete 4 Views Right  Result Date: 07/02/2016 CLINICAL DATA:  Right knee pain following fall 2 days ago. Initial encounter. EXAM: RIGHT KNEE - COMPLETE 4+ VIEW COMPARISON:  None. FINDINGS: There is no evidence of acute fracture, subluxation or dislocation. A joint effusion is present. Tricompartmental degenerative changes are noted, mild to moderate in the lateral compartment. No focal bony lesions are present. IMPRESSION: Knee effusion  without acute bony abnormality. Tricompartmental degenerative changes. Electronically Signed   By: Margarette Canada M.D.   On: 07/02/2016 21:27     Subjective: No complains breathing is great  Discharge Exam: Vitals:   07/04/16 2127 07/05/16 0454  BP: (!) 137/51 (!) 137/52  Pulse: 80 73  Resp: 20 20  Temp: 99.1 F (37.3 C) 98.5 F (36.9 C)   Vitals:   07/04/16 0600 07/04/16 1529 07/04/16 2127 07/05/16 0454  BP: (!) 131/50 (!) 140/57 (!) 137/51 (!) 137/52  Pulse: 72 73 80 73  Resp: 20 19 20 20   Temp: 97.9 F (36.6 C) 99.3 F (37.4 C) 99.1 F (37.3 C) 98.5 F (36.9 C)  TempSrc: Oral Oral Oral Oral  SpO2: 97% 95% 94% 97%  Weight:      Height:        General: Pt is alert, awake, not in acute distress Cardiovascular: RRR, S1/S2 +, no rubs, no gallops Respiratory: CTA bilaterally, no wheezing, no rhonchi Abdominal: Soft, NT, ND,  bowel sounds + Extremities: no edema, no cyanosis    The results of significant diagnostics from this hospitalization (including imaging, microbiology, ancillary and laboratory) are listed below for reference.     Microbiology: Recent Results (from the past 240 hour(s))  Culture, Urine     Status: Abnormal   Collection Time: 07/02/16 11:10 PM  Result Value Ref Range Status   Specimen Description URINE, CLEAN CATCH  Final   Special Requests NONE  Final   Culture >=100,000 COLONIES/mL YEAST (A)  Final   Report Status 07/04/2016 FINAL  Final     Labs: BNP (last 3 results) No results for input(s): BNP in the last 8760 hours. Basic Metabolic Panel:  Recent Labs Lab 07/02/16 2314 07/02/16 2333 07/03/16 1023 07/04/16 0433  NA 134* 134*  --  136  K 4.8 5.2*  --  3.4*  CL 98* 100*  --  100*  CO2  --  24  --  26  GLUCOSE 201* 210*  --  171*  BUN 13 13  --  20  CREATININE 0.80 0.76 1.00 1.00  CALCIUM  --  8.2*  --  8.2*   Liver Function Tests:  Recent Labs Lab 07/02/16 2333  AST 39  ALT 15  ALKPHOS 93  BILITOT 2.2*  PROT 6.5   ALBUMIN 3.0*   No results for input(s): LIPASE, AMYLASE in the last 168 hours. No results for input(s): AMMONIA in the last 168 hours. CBC:  Recent Labs Lab 07/02/16 2314 07/02/16 2333 07/03/16 1023 07/04/16 0433  WBC  --  14.2* 11.6* 11.1*  NEUTROABS  --  12.0*  --   --   HGB 9.9* 13.4 11.5* 10.6*  HCT 29.0* 38.5 34.8* 31.9*  MCV  --  90.8 91.8 92.7  PLT  --  275 271 242   Cardiac Enzymes:  Recent Labs Lab 07/02/16 2333 07/03/16 1641  CKTOTAL 399* 414*   BNP: Invalid input(s): POCBNP CBG:  Recent Labs Lab 07/04/16 0755 07/04/16 1127 07/04/16 1631 07/04/16 2130 07/05/16 0740  GLUCAP 188* 177* 212* 197* 191*   D-Dimer No results for input(s): DDIMER in the last 72 hours. Hgb A1c No results for input(s): HGBA1C in the last 72 hours. Lipid Profile No results for input(s): CHOL, HDL, LDLCALC, TRIG, CHOLHDL, LDLDIRECT in the last 72 hours. Thyroid function studies  Recent Labs  07/04/16 0433  TSH 1.252   Anemia work up No results for input(s): VITAMINB12, FOLATE, FERRITIN, TIBC, IRON, RETICCTPCT in the last 72 hours. Urinalysis    Component Value Date/Time   COLORURINE YELLOW 07/02/2016 2310   APPEARANCEUR HAZY (A) 07/02/2016 2310   LABSPEC 1.021 07/02/2016 2310   PHURINE 5.0 07/02/2016 2310   GLUCOSEU 50 (A) 07/02/2016 2310   HGBUR SMALL (A) 07/02/2016 2310   HGBUR negative 12/10/2006 1513   BILIRUBINUR NEGATIVE 07/02/2016 2310   KETONESUR 5 (A) 07/02/2016 2310   PROTEINUR 30 (A) 07/02/2016 2310   UROBILINOGEN 0.2 12/10/2006 1513   NITRITE NEGATIVE 07/02/2016 2310   LEUKOCYTESUR NEGATIVE 07/02/2016 2310   Sepsis Labs Invalid input(s): PROCALCITONIN,  WBC,  LACTICIDVEN Microbiology Recent Results (from the past 240 hour(s))  Culture, Urine     Status: Abnormal   Collection Time: 07/02/16 11:10 PM  Result Value Ref Range Status   Specimen Description URINE, CLEAN CATCH  Final   Special Requests NONE  Final   Culture >=100,000 COLONIES/mL  YEAST (A)  Final   Report Status 07/04/2016 FINAL  Final     Time coordinating  discharge: Over 30 minutes  SIGNED:   Charlynne Cousins, MD  [facility] 07/05/2016, 7:54 AM Pager   If 7PM-7AM, please contact night-coverage [email or web address] Password [email or web address]

## 2016-07-05 NOTE — Progress Notes (Signed)
Progress Note  Patient Name: Jody Simpson Date of Encounter: 07/05/2016  Primary Cardiologist: Dr. Percival Spanish (new)  Subjective   She is not feeling palpitations.  No chest pain.   Inpatient Medications    Scheduled Meds: . allopurinol  300 mg Oral Daily  . atenolol  50 mg Oral BID  . azithromycin  500 mg Intravenous Q24H  . cefTRIAXone (ROCEPHIN)  IV  1 g Intravenous Q24H  . glipiZIDE  10 mg Oral BID AC  . heparin  5,000 Units Subcutaneous Q8H  . lisinopril  20 mg Oral Daily   And  . hydrochlorothiazide  25 mg Oral Daily  . insulin aspart  0-5 Units Subcutaneous QHS  . insulin aspart  0-9 Units Subcutaneous TID WC  . pioglitazone  15 mg Oral Daily   Continuous Infusions:  PRN Meds: acetaminophen **OR** acetaminophen, albuterol   Vital Signs    Vitals:   07/04/16 0600 07/04/16 1529 07/04/16 2127 07/05/16 0454  BP: (!) 131/50 (!) 140/57 (!) 137/51 (!) 137/52  Pulse: 72 73 80 73  Resp: 20 19 20 20   Temp: 97.9 F (36.6 C) 99.3 F (37.4 C) 99.1 F (37.3 C) 98.5 F (36.9 C)  TempSrc: Oral Oral Oral Oral  SpO2: 97% 95% 94% 97%  Weight:      Height:        Intake/Output Summary (Last 24 hours) at 07/05/16 0843 Last data filed at 07/04/16 1907  Gross per 24 hour  Intake              720 ml  Output                0 ml  Net              720 ml   Filed Weights   07/02/16 2022  Weight: (!) 319 lb (144.7 kg)    Telemetry    Atrial arrhythmias and NSR - Personally Reviewed  ECG    NA - Personally Reviewed  Physical Exam   GEN: No acute distress.   Neck: No JVD Cardiac: RRR, no murmurs, rubs, or gallops.  Respiratory: Clear to auscultation bilaterally. GI: Soft, nontender, non-distended  MS: No edema; No deformity. Neuro:  Nonfocal  Psych: Normal affect   Labs    Chemistry Recent Labs Lab 07/02/16 2314 07/02/16 2333 07/03/16 1023 07/04/16 0433  NA 134* 134*  --  136  K 4.8 5.2*  --  3.4*  CL 98* 100*  --  100*  CO2  --  24  --  26    GLUCOSE 201* 210*  --  171*  BUN 13 13  --  20  CREATININE 0.80 0.76 1.00 1.00  CALCIUM  --  8.2*  --  8.2*  PROT  --  6.5  --   --   ALBUMIN  --  3.0*  --   --   AST  --  39  --   --   ALT  --  15  --   --   ALKPHOS  --  93  --   --   BILITOT  --  2.2*  --   --   GFRNONAA  --  >60 58* 58*  GFRAA  --  >60 >60 >60  ANIONGAP  --  10  --  10     Hematology Recent Labs Lab 07/02/16 2333 07/03/16 1023 07/04/16 0433  WBC 14.2* 11.6* 11.1*  RBC 4.24 3.79* 3.44*  HGB 13.4 11.5*  10.6*  HCT 38.5 34.8* 31.9*  MCV 90.8 91.8 92.7  MCH 31.6 30.3 30.8  MCHC 34.8 33.0 33.2  RDW 15.2 15.1 15.2  PLT 275 271 242    Cardiac EnzymesNo results for input(s): TROPONINI in the last 168 hours. No results for input(s): TROPIPOC in the last 168 hours.   BNPNo results for input(s): BNP, PROBNP in the last 168 hours.   DDimer No results for input(s): DDIMER in the last 168 hours.   Radiology    Dg Chest 2 View  Result Date: 07/04/2016 CLINICAL DATA:  Shortness of breath, recent fall EXAM: CHEST  2 VIEW COMPARISON:  07/02/2016 FINDINGS: Cardiomegaly. Left base atelectasis. Right lung is clear. No effusions or pneumothorax. No acute bony abnormality. IMPRESSION: Cardiomegaly.  Left base atelectasis. Electronically Signed   By: Rolm Baptise M.D.   On: 07/04/2016 09:36    Cardiac Studies   None  Patient Profile     66 y.o.morbidly obese female (BMI 51) with a history of NIDDM, HTN, and HLD admitted to ED at [facility] 07/03/16 after she fell at home and was unable to get up or take her medications x 36-49 hrs. In the ED she was noted to have runs of PAT. Since admission she has had PAF runs that have resolved with resumption of her beta blocker.   Assessment & Plan    ARRHYTHMIA:    She has atrial tach and a few pauses.  I am not convinced that this is fib.  Also, with her social history and difficulty with fall on admission, I think that long term anticoagulation would be problematic.  No suggestion for  DOAC or warfarin at this point.  She does well on Atenolol.  continue this therapy.  Can follow with cardiology as needed.     Minus Breeding    Signed, Minus Breeding, MD  07/05/2016, 8:43 AM

## 2016-07-07 ENCOUNTER — Non-Acute Institutional Stay (SKILLED_NURSING_FACILITY): Payer: Medicare Other | Admitting: Adult Health

## 2016-07-07 ENCOUNTER — Encounter: Payer: Self-pay | Admitting: Adult Health

## 2016-07-07 DIAGNOSIS — J309 Allergic rhinitis, unspecified: Secondary | ICD-10-CM

## 2016-07-07 DIAGNOSIS — I471 Supraventricular tachycardia: Secondary | ICD-10-CM

## 2016-07-07 DIAGNOSIS — R5381 Other malaise: Secondary | ICD-10-CM | POA: Diagnosis not present

## 2016-07-07 DIAGNOSIS — E1140 Type 2 diabetes mellitus with diabetic neuropathy, unspecified: Secondary | ICD-10-CM

## 2016-07-07 DIAGNOSIS — I10 Essential (primary) hypertension: Secondary | ICD-10-CM | POA: Diagnosis not present

## 2016-07-07 DIAGNOSIS — I4719 Other supraventricular tachycardia: Secondary | ICD-10-CM

## 2016-07-07 DIAGNOSIS — M1A9XX0 Chronic gout, unspecified, without tophus (tophi): Secondary | ICD-10-CM

## 2016-07-07 DIAGNOSIS — J189 Pneumonia, unspecified organism: Secondary | ICD-10-CM | POA: Diagnosis not present

## 2016-07-07 NOTE — Progress Notes (Addendum)
DATE:  07/07/2016   MRN:  [id number]  BIRTHDAY: 1950-08-31  Facility:  [facility]ursing Home Location:  [place] Room Number: 1104-A  LEVEL OF CARE:  SNF [id number]  Contact Information    Name Relation Home Work [place] Brother [phone]  [phone]       Code Status History    Date Active Date Inactive Code Status Order ID Comments User Context   07/03/2016  7:33 AM 07/05/2016  2:28 PM Full Code [id number]  Albertine Patricia, MD ED       Chief Complaint  Patient presents with  . Hospitalization Follow-up    HISTORY OF PRESENT ILLNESS:  This is a 24-YO female seen for hospital follow-up.  She was admitted to [facility] on 07/05/2016 for short-term rehabilitation following an admission at [facility] 07/02/2016-07/05/2016 for a fall at home.  She was unable to get up and remained on the floor overnight until the family found her And was brought to the hospital. She was found to have pneumonia and and was started on IV Rocephin and azithromycin.   She was seen in the room today and requested to be put back on Pepcid for her GERD `    PAST MEDICAL HISTORY:  Past Medical History:  Diagnosis Date  . CAP (community acquired pneumonia)   . Diabetes mellitus without complication ([place])   . Fall at home    Down x48 hours  . GERD (gastroesophageal reflux disease)   . Gout   . Hyperkalemia    Mild  . Hyperlipidemia   . Hypertension   . IBS (irritable bowel syndrome)   . 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 Morbid obesity ([place])   . Neuropathy ([place])   . Non-traumatic rhabdomyolysis   . PAT (paroxysmal atrial tachycardia) ([place])   . Sinus arrhythmia      CURRENT MEDICATIONS: Reviewed  Patient's Medications  New Prescriptions   No medications on file  Previous Medications   ALLOPURINOL (ZYLOPRIM) 300 MG TABLET    TK 1 T PO QD   ATENOLOL (TENORMIN) 50 MG TABLET    Take 50 mg by mouth 2 (two)  times daily.    GLIPIZIDE (GLUCOTROL) 10 MG TABLET    TK 1 T PO BID   LEVOFLOXACIN (LEVAQUIN) 750 MG TABLET    Take 1 tablet (750 mg total) by mouth daily.   LISINOPRIL-HYDROCHLOROTHIAZIDE (PRINZIDE,ZESTORETIC) 20-25 MG TABLET    TK 1 T PO QD   LORATADINE (CLARITIN) 10 MG TABLET    Take 10 mg by mouth daily.   PIOGLITAZONE (ACTOS) 15 MG TABLET    TK 1 T PO QD  Modified Medications   No medications on file  Discontinued Medications   No medications on file     Allergies  Allergen Reactions  . Aspirin     REACTION: stomach irritation  . Cholestyramine     REACTION: Diarrhea and nausea  . Codeine     REACTION: stomach irritant  . Diazepam     REACTION: hyper  . Hydrocodone   . Ibuprofen Other (See Comments)  . Iodine   . Latex   . Meperidine Hcl   . Metformin     REACTION: Diarrhea  . Naproxen Sodium     REACTION: stomach irritant  . Penicillins   . Pravastatin Sodium     REACTION: Flu like symptoms  . Sitagliptin Phosphate     REACTION:  Caused elevated CBGs     REVIEW OF SYSTEMS:  GENERAL: no change in appetite, no fatigue, no weight changes, no fever, chills or weakness EYES: Denies change in vision, dry eyes, eye pain, itching or discharge EARS: Denies change in hearing, ringing in ears, or earache NOSE: Denies nasal congestion or epistaxis MOUTH and THROAT: Denies oral discomfort, gingival pain or bleeding, pain from teeth or hoarseness   RESPIRATORY: no cough, SOB, DOE, wheezing, hemoptysis CARDIAC: no chest pain, edema or palpitations GI: no abdominal pain, diarrhea, constipation, heart burn, nausea or vomiting GU: Denies dysuria, frequency, hematuria, incontinence, or discharge PSYCHIATRIC: Denies feeling of depression or anxiety. No report of hallucinations, insomnia, paranoia, or agitation    PHYSICAL EXAMINATION  GENERAL APPEARANCE: Well nourished. In no acute distress. Morbidly obese SKIN:  Skin is warm and dry.  HEAD: Normal in size and contour. No  evidence of trauma EYES: Lids open and close normally. No blepharitis, entropion or ectropion. PERRL. Conjunctivae are clear and sclerae are white. Lenses are without opacity EARS: Pinnae are normal. Patient hears normal voice tunes of the examiner MOUTH and THROAT: Lips are without lesions. Oral mucosa is moist and without lesions. Tongue is normal in shape, size, and color and without lesions NECK: supple, trachea midline, no neck masses, no thyroid tenderness, no thyromegaly LYMPHATICS: no LAN in the neck, no supraclavicular LAN RESPIRATORY: breathing is even & unlabored, BS CTAB CARDIAC: RRR, no murmur,no extra heart sounds, no edema GI: abdomen soft, normal BS, no masses, no tenderness, no hepatomegaly, no splenomegaly EXTREMITIES:  Able to move 4 extremities PSYCHIATRIC: Alert and oriented X 3. Affect and behavior are appropriate  LABS/RADIOLOGY: Labs reviewed: Basic Metabolic Panel:  Recent Labs  07/02/16 2314 07/02/16 2333 07/03/16 1023 07/04/16 0433  NA 134* 134*  --  136  K 4.8 5.2*  --  3.4*  CL 98* 100*  --  100*  CO2  --  24  --  26  GLUCOSE 201* 210*  --  171*  BUN 13 13  --  20  CREATININE 0.80 0.76 1.00 1.00  CALCIUM  --  8.2*  --  8.2*   Liver Function Tests:  Recent Labs  07/02/16 2333  AST 39  ALT 15  ALKPHOS 93  BILITOT 2.2*  PROT 6.5  ALBUMIN 3.0*   CBC:  Recent Labs  07/02/16 2333 07/03/16 1023 07/04/16 0433  WBC 14.2* 11.6* 11.1*  NEUTROABS 12.0*  --   --   HGB 13.4 11.5* 10.6*  HCT 38.5 34.8* 31.9*  MCV 90.8 91.8 92.7  PLT 275 271 242   Cardiac Enzymes:  Recent Labs  07/02/16 2333 07/03/16 1641  CKTOTAL 399* 414*   CBG:  Recent Labs  07/04/16 1631 07/04/16 2130 07/05/16 0740  GLUCAP 212* 197* 191*      Dg Chest 2 View  Result Date: 07/04/2016 CLINICAL DATA:  Shortness of breath, recent fall EXAM: CHEST  2 VIEW COMPARISON:  07/02/2016 FINDINGS: Cardiomegaly. Left base atelectasis. Right lung is clear. No effusions  or pneumothorax. No acute bony abnormality. IMPRESSION: Cardiomegaly.  Left base atelectasis. Electronically Signed   By: Rolm Baptise M.D.   On: 07/04/2016 09:36   Dg Chest Port 1 View  Result Date: 07/02/2016 CLINICAL DATA:  Status post fall, with concern for chest injury. Initial encounter. EXAM: PORTABLE CHEST 1 VIEW COMPARISON:  None. FINDINGS: The lungs are well-aerated. Peripheral left midlung opacity could reflect pneumonia. Would correlate for any associated symptoms. There is no evidence of  pleural effusion or pneumothorax. The cardiomediastinal silhouette is mildly enlarged. No acute osseous abnormalities are seen. IMPRESSION: 1. Peripheral left midlung opacity could reflect pneumonia. Would correlate for any associated symptoms. 2. Mild cardiomegaly. 3. No displaced rib fracture seen. Electronically Signed   By: Garald Balding M.D.   On: 07/02/2016 22:12   Dg Knee Complete 4 Views Right  Result Date: 07/02/2016 CLINICAL DATA:  Right knee pain following fall 2 days ago. Initial encounter. EXAM: RIGHT KNEE - COMPLETE 4+ VIEW COMPARISON:  None. FINDINGS: There is no evidence of acute fracture, subluxation or dislocation. A joint effusion is present. Tricompartmental degenerative changes are noted, mild to moderate in the lateral compartment. No focal bony lesions are present. IMPRESSION: Knee effusion without acute bony abnormality. Tricompartmental degenerative changes. Electronically Signed   By: Margarette Canada M.D.   On: 07/02/2016 21:27    ASSESSMENT/PLAN:  Physical deconditioning - for rehabilitation, PT and OT for therapeutic strengthening exercises; fall precautions  CAP - empirically started on IV Rocephin and azithromycin then changed to Levaquin till 07/10/16 , check CBC  Essential hypertension - continue lisinopril HCTZ 20-25 mg 1 tab by mouth daily and atenolol 50 mg 1 tab by mouth twice a day; check BMP  Gout - continue allopurinol 300 mg 1 tab by mouth  Allergic rhinitis -  continue Claritin 10 mg 1 tab by mouth daily  Diabetes mellitus, type II  - continue by mouth Pioglitazone 15 mg 1 tab by mouth daily. CBG Q BID Lab Results  Component Value Date   HGBA1C 6.7 06/05/2010   Atrial fibrillation - rate controlled; thought to be due to not being able to take her beta blocker, she is not a candidate for anticoagulation due to multiple falls; continue atenolol 50 mg twice a day    Goals of care:  Short-term rehabilitation    Jody Simpson C. Lowell - NP [employer] [phone]

## 2016-07-08 LAB — HEMOGLOBIN A1C: Hemoglobin A1C: 7.1

## 2016-07-08 LAB — CBC AND DIFFERENTIAL
HEMATOCRIT: 33 % — AB (ref 36–46)
Hemoglobin: 10.9 g/dL — AB (ref 12.0–16.0)
PLATELETS: 322 10*3/uL (ref 150–399)
WBC: 12 10^3/mL

## 2016-07-10 LAB — CBC AND DIFFERENTIAL
HEMATOCRIT: 35 % — AB (ref 36–46)
Hemoglobin: 11.1 g/dL — AB (ref 12.0–16.0)
Platelets: 397 10*3/uL (ref 150–399)
WBC: 12.3 10^3/mL

## 2016-07-10 NOTE — Progress Notes (Signed)
   07/04/16 1117  PT Time Calculation  PT Start Time (ACUTE ONLY) 1043  PT Stop Time (ACUTE ONLY) 1111  PT Time Calculation (min) (ACUTE ONLY) 28 min  PT G-Codes **NOT FOR INPATIENT CLASS**  Functional Assessment Tool Used (clinical judgement)  Functional Limitation Mobility: Walking and moving around  Mobility: Walking and Moving Around Current Status [id number]) CK  Mobility: Walking and Moving Around Goal Status [id number]) CJ  PT General Charges  $$ ACUTE PT VISIT 1 Procedure  PT Evaluation  $PT Eval Low Complexity 1 Procedure  PT Treatments  $Therapeutic Activity 8-22 mins   Weston Anna, MPT Pager: [phone]

## 2016-07-16 LAB — CBC AND DIFFERENTIAL
HCT: 35 % — AB (ref 36–46)
Hemoglobin: 11.2 g/dL — AB (ref 12.0–16.0)
Platelets: 308 10*3/uL (ref 150–399)
WBC: 9.5 10^3/mL

## 2016-07-23 ENCOUNTER — Non-Acute Institutional Stay (SKILLED_NURSING_FACILITY): Payer: Medicare Other | Admitting: Internal Medicine

## 2016-07-23 ENCOUNTER — Encounter: Payer: Self-pay | Admitting: Internal Medicine

## 2016-07-23 DIAGNOSIS — E1140 Type 2 diabetes mellitus with diabetic neuropathy, unspecified: Secondary | ICD-10-CM

## 2016-07-23 DIAGNOSIS — I471 Supraventricular tachycardia: Secondary | ICD-10-CM | POA: Diagnosis not present

## 2016-07-23 DIAGNOSIS — J189 Pneumonia, unspecified organism: Secondary | ICD-10-CM | POA: Diagnosis not present

## 2016-07-23 DIAGNOSIS — I4719 Other supraventricular tachycardia: Secondary | ICD-10-CM

## 2016-07-23 DIAGNOSIS — I10 Essential (primary) hypertension: Secondary | ICD-10-CM | POA: Diagnosis not present

## 2016-07-23 NOTE — Progress Notes (Signed)
Location:  [place] Room Number: 303-B Place of Service:  SNF [phone]) Provider:  Mahima Germain Osgood, MD  Patient Care Team: Leighton Ruff, MD as PCP - General (Family Medicine)  Extended Emergency Contact Information Primary Emergency Contact: Suzzette Righter [place] Phone: [phone] Mobile Phone: [phone] Relation: Brother  Code Status:  Full Code Goals of care: Advanced Directive information Advanced Directives 07/03/2016  Does Patient Have a Medical Advance Directive? No  Would patient like information on creating a medical advance directive? No - Patient declined     Chief Complaint  Patient presents with  . New Admit To SNF    New Admission Visit     HPI:  Pt is a 66 y.o. female seen today for medical management of chronic diseases.   Patient has h/o Diabetes Mellitus, Hypertension, Neuropathy, gout, Hyperlipidemia, Obesity, IBS.  Patient fell from her bed in her house as she was trying to reach for her blanket. She was unable to get back up and was on the floor for 2 days until her brother found her in the house and took her to Ed. She was found to have Community acquired pneumonia. She was treated with Rocephin and Azithromycin and then with Levaquin Po. She also had mild Rhabdomyolysis and Hyperkalemia which responded to Hydration. She had run of PAT which responded when she started back on her Beta blocker. Cardiologist did not think she had PAF and anyway with her h/o falls it was decided not to do any anticoagulation at this time.  Patient lives by herself but trying to get into some assisted facility. She was driving and was independent before the fall. She does use Wheelchair PRN. She is doing well in the facility and walking with the walker right now and able to do transfers.  Past Medical History:  Diagnosis Date  . CAP (community acquired pneumonia)   . Diabetes mellitus  without complication ([place])   . Fall at home    Down x48 hours  . GERD (gastroesophageal reflux disease)   . Gout   . Hyperkalemia    Mild  . Hyperlipidemia   . Hypertension   . IBS (irritable bowel syndrome)   . 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 Morbid obesity ([place])   . Neuropathy ([place])   . Non-traumatic rhabdomyolysis   . PAT (paroxysmal atrial tachycardia) ([place])   . Sinus arrhythmia    Past Surgical History:  Procedure Laterality Date  . kideny removal      Allergies  Allergen Reactions  . Aspirin     REACTION: stomach irritation  . Cholestyramine     REACTION: Diarrhea and nausea  . Codeine     REACTION: stomach irritant  . Diazepam     REACTION: hyper  . Hydrocodone   . Ibuprofen Other (See Comments)  . Iodine   . Latex   . Meperidine Hcl   . Metformin     REACTION: Diarrhea  . Naproxen Sodium     REACTION: stomach irritant  . Penicillins   . Pravastatin Sodium     REACTION: Flu like symptoms  . Sitagliptin Phosphate     REACTION: Caused elevated CBGs    Allergies as of 07/23/2016      Reactions   Aspirin    REACTION: stomach irritation   Cholestyramine    REACTION: Diarrhea and nausea   Codeine    REACTION: stomach irritant  Diazepam    REACTION: hyper   Hydrocodone    Ibuprofen Other (See Comments)   Iodine    Latex    Meperidine Hcl    Metformin    REACTION: Diarrhea   Naproxen Sodium    REACTION: stomach irritant   Penicillins    Pravastatin Sodium    REACTION: Flu like symptoms   Sitagliptin Phosphate    REACTION: Caused elevated CBGs      Medication List       Accurate as of 07/23/16  1:19 PM. Always use your most recent med list.          acetaminophen 325 MG tablet Commonly known as:  TYLENOL Take 650 mg by mouth every 4 (four) hours as needed for mild pain.   allopurinol 300 MG tablet Commonly known as:  ZYLOPRIM TK 1 T PO QD   atenolol 50 MG tablet Commonly known as:  TENORMIN Take 50  mg by mouth 2 (two) times daily.   famotidine 20 MG tablet Commonly known as:  PEPCID Take 20 mg by mouth 2 (two) times daily.   glipiZIDE 10 MG tablet Commonly known as:  GLUCOTROL TK 1 T PO BID   lisinopril-hydrochlorothiazide 20-25 MG tablet Commonly known as:  PRINZIDE,ZESTORETIC TK 1 T PO QD   loratadine 10 MG tablet Commonly known as:  CLARITIN Take 10 mg by mouth daily.   pioglitazone 15 MG tablet Commonly known as:  ACTOS TK 1 T PO QD       Review of Systems  Review of Systems  Constitutional: Negative for activity change, appetite change, chills, diaphoresis, fatigue and fever.  HENT: Negative for mouth sores, postnasal drip, rhinorrhea, sinus pain and sore throat.   Respiratory: Negative for apnea, cough, chest tightness, shortness of breath and wheezing.   Cardiovascular: Negative for chest pain, palpitations and Has chronic leg swelling.  Gastrointestinal: Negative for abdominal distention, abdominal pain, constipation, , nausea and vomiting. Has diarrhea resolved now Genitourinary: Negative for dysuria and frequency.  Musculoskeletal: Negative for arthralgias, joint swelling and myalgias.  Skin: Negative for rash.  Neurological: Negative for dizziness, syncope, weakness, light-headedness and numbness.  Psychiatric/Behavioral: Negative for behavioral problems, confusion and sleep disturbance.     Immunization History  Administered Date(s) Administered  . Influenza Whole 03/06/2006, 03/22/2008  . Pneumococcal Polysaccharide-23 06/05/2003  . Td 07/03/2001   Pertinent  Health Maintenance Due  Topic Date Due  . FOOT EXAM  09/25/1961  . OPHTHALMOLOGY EXAM  09/25/1961  . MAMMOGRAM  09/25/2001  . PAP SMEAR  07/04/2006  . HEMOGLOBIN A1C  12/04/2010  . COLONOSCOPY  07/07/2012  . INFLUENZA VACCINE  01/01/2016   No flowsheet data found. Functional Status Survey:    Vitals:   07/23/16 1307  BP: (!) 141/93  Pulse: 67  Resp: 18  Temp: 97.4 F (36.3 C)    TempSrc: Oral  SpO2: 98%  Weight: 297 lb (134.7 kg)  Height: 5\' 7"  (1.702 m)   Body mass index is 46.52 kg/m. Physical Exam  Constitutional: She is oriented to person, place, and time. She appears well-developed and well-nourished.  HENT:  Head: Normocephalic.  Mouth/Throat: Oropharynx is clear and moist.  Eyes: Pupils are equal, round, and reactive to light.  Neck: Neck supple.  Cardiovascular: Normal rate, regular rhythm and normal heart sounds.   No murmur heard. Pulmonary/Chest: Breath sounds normal. No respiratory distress. She has no wheezes. She has no rales.  Abdominal: Soft. Bowel sounds are normal. She exhibits no distension. There  is no tenderness. There is no rebound.  Musculoskeletal:  Bilateral edema. Moderate  Neurological: She is alert and oriented to person, place, and time.  No focal deficit. Left UE restricted due to shoulder injury  Skin: Skin is warm and dry. No rash noted. No erythema.  Psychiatric: She has a normal mood and affect. Her behavior is normal. Thought content normal.    Labs reviewed:  Recent Labs  07/02/16 2314 07/02/16 2333 07/03/16 1023 07/04/16 0433  NA 134* 134*  --  136  K 4.8 5.2*  --  3.4*  CL 98* 100*  --  100*  CO2  --  24  --  26  GLUCOSE 201* 210*  --  171*  BUN 13 13  --  20  CREATININE 0.80 0.76 1.00 1.00  CALCIUM  --  8.2*  --  8.2*    Recent Labs  07/02/16 2333  AST 39  ALT 15  ALKPHOS 93  BILITOT 2.2*  PROT 6.5  ALBUMIN 3.0*    Recent Labs  07/02/16 2333 07/03/16 1023 07/04/16 0433 07/08/16 07/10/16 07/16/16  WBC 14.2* 11.6* 11.1* 12.0 12.3 9.5  NEUTROABS 12.0*  --   --   --   --   --   HGB 13.4 11.5* 10.6* 10.9* 11.1* 11.2*  HCT 38.5 34.8* 31.9* 33* 35* 35*  MCV 90.8 91.8 92.7  --   --   --   PLT 275 271 242 322 397 308   Lab Results  Component Value Date   TSH 1.252 07/04/2016   Lab Results  Component Value Date   HGBA1C 7.1 07/08/2016   Lab Results  Component Value Date   CHOL 235 (H)  01/04/2009   HDL 42 01/04/2009   LDLCALC See Comment mg/dL 01/04/2009   LDLDIRECT 124 (H) 03/22/2008   TRIG 544 (H) 01/04/2009   CHOLHDL 5.6 Ratio 01/04/2009    Significant Diagnostic Results in last 30 days:  Dg Chest 2 View  Result Date: 07/04/2016 CLINICAL DATA:  Shortness of breath, recent fall EXAM: CHEST  2 VIEW COMPARISON:  07/02/2016 FINDINGS: Cardiomegaly. Left base atelectasis. Right lung is clear. No effusions or pneumothorax. No acute bony abnormality. IMPRESSION: Cardiomegaly.  Left base atelectasis. Electronically Signed   By: Rolm Baptise M.D.   On: 07/04/2016 09:36   Dg Chest Port 1 View  Result Date: 07/02/2016 CLINICAL DATA:  Status post fall, with concern for chest injury. Initial encounter. EXAM: PORTABLE CHEST 1 VIEW COMPARISON:  None. FINDINGS: The lungs are well-aerated. Peripheral left midlung opacity could reflect pneumonia. Would correlate for any associated symptoms. There is no evidence of pleural effusion or pneumothorax. The cardiomediastinal silhouette is mildly enlarged. No acute osseous abnormalities are seen. IMPRESSION: 1. Peripheral left midlung opacity could reflect pneumonia. Would correlate for any associated symptoms. 2. Mild cardiomegaly. 3. No displaced rib fracture seen. Electronically Signed   By: Garald Balding M.D.   On: 07/02/2016 22:12   Dg Knee Complete 4 Views Right  Result Date: 07/02/2016 CLINICAL DATA:  Right knee pain following fall 2 days ago. Initial encounter. EXAM: RIGHT KNEE - COMPLETE 4+ VIEW COMPARISON:  None. FINDINGS: There is no evidence of acute fracture, subluxation or dislocation. A joint effusion is present. Tricompartmental degenerative changes are noted, mild to moderate in the lateral compartment. No focal bony lesions are present. IMPRESSION: Knee effusion without acute bony abnormality. Tricompartmental degenerative changes. Electronically Signed   By: Margarette Canada M.D.   On: 07/02/2016 21:27  Assessment/Plan Community  acquired pneumonia Patient is doing well Off her antibiotics. WBC normal and she is staying Afebrile with No SOB.  Type 2 diabetes mellitus with diabetic neuropathy, On Oral Hypoglycemics.BS b/w 100-180 mostly. Follow up with her PCP   PAT (paroxysmal atrial tachycardia)  Resolved on Beta blockers.  Essential hypertension Stable on Lisinopril Chronic LE edema Try to elevate legs and try ted hoses.  Hyperlipidemia Needs to be on statin but with her high CK will wait for PCP to start her on that S/P Fall patient discharge with her mom till she finds independent place for her self. With assistance.  Family/ staff Communication:   Labs/tests ordered:

## 2016-07-25 ENCOUNTER — Encounter: Payer: Self-pay | Admitting: Adult Health

## 2016-07-25 ENCOUNTER — Non-Acute Institutional Stay (SKILLED_NURSING_FACILITY): Payer: Medicare Other | Admitting: Adult Health

## 2016-07-25 DIAGNOSIS — I10 Essential (primary) hypertension: Secondary | ICD-10-CM | POA: Diagnosis not present

## 2016-07-25 DIAGNOSIS — E1140 Type 2 diabetes mellitus with diabetic neuropathy, unspecified: Secondary | ICD-10-CM | POA: Diagnosis not present

## 2016-07-25 DIAGNOSIS — R5381 Other malaise: Secondary | ICD-10-CM | POA: Diagnosis not present

## 2016-07-25 DIAGNOSIS — M1A9XX0 Chronic gout, unspecified, without tophus (tophi): Secondary | ICD-10-CM

## 2016-07-25 DIAGNOSIS — J189 Pneumonia, unspecified organism: Secondary | ICD-10-CM | POA: Diagnosis not present

## 2016-07-25 DIAGNOSIS — I471 Supraventricular tachycardia: Secondary | ICD-10-CM | POA: Diagnosis not present

## 2016-07-25 DIAGNOSIS — J309 Allergic rhinitis, unspecified: Secondary | ICD-10-CM

## 2016-07-25 NOTE — Progress Notes (Signed)
DATE:  07/25/2016   MRN:  [id number]  BIRTHDAY: 05/28/1951  Facility:  Nursing Home Location:  [place] Room Number: 303-B  LEVEL OF CARE:  SNF [phone])  Contact Information    Name Relation Home Work [place] Brother [phone]  [phone]       Code Status History    Date Active Date Inactive Code Status Order ID Comments User Context   07/03/2016  7:33 AM 07/05/2016  2:28 PM Full Code [id number]  Albertine Patricia, MD ED       Chief Complaint  Patient presents with  . Discharge Note    HISTORY OF PRESENT ILLNESS:  This is a 39-YO female seen for discharge.  She will discharge home 07/26/2016 with home health PT, OT, CNA, and Nursing services.  DME:  Rolling walker, bariatric bedside commode.  She was admitted to [facility] on 07/05/2016 for short-term rehabilitation following an admission at [facility] 07/02/2016-07/05/2016 for a fall at home.  She was unable to get up and remained on the floor overnight until the family found her And was brought to the hospital. She was found to have pneumonia and and was started on IV Rocephin and azithromycin.   Patient was admitted to this facility for short-term rehabilitation after the patient's recent hospitalization.  Patient has completed SNF rehabilitation and therapy has cleared the patient for discharge.  She was seen in the room today and verbalized that she is going to stay with her mother while applying for an independent living facility. She is capable of living in an independent living facility.   PAST MEDICAL HISTORY:  Past Medical History:  Diagnosis Date  . CAP (community acquired pneumonia)   . Diabetes mellitus without complication ([place])   . Fall at home    Down x48 hours  . GERD (gastroesophageal reflux disease)   . Gout   . Hyperkalemia    Mild  . Hyperlipidemia   . Hypertension   . IBS (irritable bowel syndrome)   . Malignant neoplasm of  kidney excluding renal pelvis ([place])   . Morbid obesity ([place])   . Neuropathy ([place])   . Non-traumatic rhabdomyolysis   . PAT (paroxysmal atrial tachycardia) ([place])   . Sinus arrhythmia      CURRENT MEDICATIONS: Reviewed  Patient's Medications  New Prescriptions   No medications on file  Previous Medications   ACETAMINOPHEN (TYLENOL) 325 MG TABLET    Take 650 mg by mouth every 4 (four) hours as needed for mild pain.   ALLOPURINOL (ZYLOPRIM) 300 MG TABLET    TK 1 T PO QD   ATENOLOL (TENORMIN) 50 MG TABLET    Take 50 mg by mouth 2 (two) times daily.    FAMOTIDINE (PEPCID) 20 MG TABLET    Take 20 mg by mouth 2 (two) times daily.   GLIPIZIDE (GLUCOTROL) 10 MG TABLET    TK 1 T PO BID   LISINOPRIL-HYDROCHLOROTHIAZIDE (PRINZIDE,ZESTORETIC) 20-25 MG TABLET    TK 1 T PO QD   LORATADINE (CLARITIN) 10 MG TABLET    Take 10 mg by mouth daily.   PIOGLITAZONE (ACTOS) 15 MG TABLET    TK 1 T PO QD  Modified Medications   No medications on file  Discontinued Medications   No medications on file     Allergies  Allergen Reactions  . Aspirin     REACTION: stomach irritation  . Cholestyramine     REACTION:  Diarrhea and nausea  . Codeine     REACTION: stomach irritant  . Diazepam     REACTION: hyper  . Hydrocodone   . Ibuprofen Other (See Comments)  . Iodine   . Latex   . Meperidine Hcl   . Metformin     REACTION: Diarrhea  . Naproxen Sodium     REACTION: stomach irritant  . Penicillins   . Pravastatin Sodium     REACTION: Flu like symptoms  . Sitagliptin Phosphate     REACTION: Caused elevated CBGs     REVIEW OF SYSTEMS:  GENERAL: no change in appetite, no fatigue, no weight changes, no fever, chills or weakness EYES: Denies change in vision, dry eyes, eye pain, itching or discharge EARS: Denies change in hearing, ringing in ears, or earache NOSE: Denies nasal congestion or epistaxis MOUTH and THROAT: Denies oral discomfort, gingival pain or bleeding, pain from teeth or hoarseness    RESPIRATORY: no cough, SOB, DOE, wheezing, hemoptysis CARDIAC: no chest pain or palpitations GI: no abdominal pain, diarrhea, constipation, heart burn, nausea or vomiting GU: Denies dysuria, frequency, hematuria, incontinence, or discharge PSYCHIATRIC: Denies feeling of depression or anxiety. No report of hallucinations, insomnia, paranoia, or agitation   PHYSICAL EXAMINATION  GENERAL APPEARANCE: Well nourished. In no acute distress. Morbidly obese SKIN:  Skin is warm and dry.  HEAD: Normal in size and contour. No evidence of trauma EYES: Lids open and close normally. No blepharitis, entropion or ectropion. PERRL. Conjunctivae are clear and sclerae are white. Lenses are without opacity EARS: Pinnae are normal. Patient hears normal voice tunes of the examiner MOUTH and THROAT: Lips are without lesions. Oral mucosa is moist and without lesions. Tongue is normal in shape, size, and color and without lesions NECK: supple, trachea midline, no neck masses, no thyroid tenderness, no thyromegaly LYMPHATICS: no LAN in the neck, no supraclavicular LAN RESPIRATORY: breathing is even & unlabored, BS CTAB CARDIAC: RRR, no murmur,no extra heart sounds, BLE 2+ edema GI: abdomen soft, normal BS, no masses, no tenderness, no hepatomegaly, no splenomegaly EXTREMITIES:  Able to move 4 extremities, walks with walker PSYCHIATRIC: Alert and oriented X 3. Affect and behavior are appropriate   LABS/RADIOLOGY: Labs reviewed: Basic Metabolic Panel:  Recent Labs  07/02/16 2314 07/02/16 2333 07/03/16 1023 07/04/16 0433  NA 134* 134*  --  136  K 4.8 5.2*  --  3.4*  CL 98* 100*  --  100*  CO2  --  24  --  26  GLUCOSE 201* 210*  --  171*  BUN 13 13  --  20  CREATININE 0.80 0.76 1.00 1.00  CALCIUM  --  8.2*  --  8.2*   Liver Function Tests:  Recent Labs  07/02/16 2333  AST 39  ALT 15  ALKPHOS 93  BILITOT 2.2*  PROT 6.5  ALBUMIN 3.0*   CBC:  Recent Labs  07/02/16 2333 07/03/16 1023  07/04/16 0433 07/08/16 07/10/16 07/16/16  WBC 14.2* 11.6* 11.1* 12.0 12.3 9.5  NEUTROABS 12.0*  --   --   --   --   --   HGB 13.4 11.5* 10.6* 10.9* 11.1* 11.2*  HCT 38.5 34.8* 31.9* 33* 35* 35*  MCV 90.8 91.8 92.7  --   --   --   PLT 275 271 242 322 397 308   Cardiac Enzymes:  Recent Labs  07/02/16 2333 07/03/16 1641  CKTOTAL 399* 414*   CBG:  Recent Labs  07/04/16 1631 07/04/16 2130 07/05/16 0740  GLUCAP 212* 197* 191*      Dg Chest 2 View  Result Date: 07/04/2016 CLINICAL DATA:  Shortness of breath, recent fall EXAM: CHEST  2 VIEW COMPARISON:  07/02/2016 FINDINGS: Cardiomegaly. Left base atelectasis. Right lung is clear. No effusions or pneumothorax. No acute bony abnormality. IMPRESSION: Cardiomegaly.  Left base atelectasis. Electronically Signed   By: Rolm Baptise M.D.   On: 07/04/2016 09:36   Dg Chest Port 1 View  Result Date: 07/02/2016 CLINICAL DATA:  Status post fall, with concern for chest injury. Initial encounter. EXAM: PORTABLE CHEST 1 VIEW COMPARISON:  None. FINDINGS: The lungs are well-aerated. Peripheral left midlung opacity could reflect pneumonia. Would correlate for any associated symptoms. There is no evidence of pleural effusion or pneumothorax. The cardiomediastinal silhouette is mildly enlarged. No acute osseous abnormalities are seen. IMPRESSION: 1. Peripheral left midlung opacity could reflect pneumonia. Would correlate for any associated symptoms. 2. Mild cardiomegaly. 3. No displaced rib fracture seen. Electronically Signed   By: Garald Balding M.D.   On: 07/02/2016 22:12   Dg Knee Complete 4 Views Right  Result Date: 07/02/2016 CLINICAL DATA:  Right knee pain following fall 2 days ago. Initial encounter. EXAM: RIGHT KNEE - COMPLETE 4+ VIEW COMPARISON:  None. FINDINGS: There is no evidence of acute fracture, subluxation or dislocation. A joint effusion is present. Tricompartmental degenerative changes are noted, mild to moderate in the lateral  compartment. No focal bony lesions are present. IMPRESSION: Knee effusion without acute bony abnormality. Tricompartmental degenerative changes. Electronically Signed   By: Margarette Canada M.D.   On: 07/02/2016 21:27    ASSESSMENT/PLAN:  Physical deconditioning - for home health PT and OT, for therapeutic exercises; fall precautions  CAD - completed antibiotic course; resolved  Essential hypertension - well controlled; atenolol 1 tab by mouth twice a day and lisinopril/HCTZ 20/25 mg 1 tab by mouth daily   Allergic rhinitis - continue Claritin 10 mg 1 tab by mouth daily  Gout - continue allopurinol 300 mg 1 tab by mouth daily  Diabetes mellitus, type II - continue Pioglitazone 15 mg 1 tab by mouth daily and Glucotrol 10 mg 1 tab by mouth twice a day Lab Results  Component Value Date   HGBA1C 7.1 07/08/2016   GERD - continue Pepcid 20 mg 1 tab by mouth twice a day  Paroxysmal atrial tachycardia - rate controlled; continue atenolol 50 mg twice a day, is not a candidate for anticoagulation due to multiple falls      I have filled out patient's discharge paperwork and written prescriptions.  Patient will receive home health PT, OT, Nursing and CNA.  DME provided:  Bariatric rolling walker and bariatric bedside commode  Total discharge time: Greater than 30 minutes Greater than 50% was spent in counseling and coordination of care with the patient.  Discharge time involved coordination of the discharge process with social worker, nursing staff and therapy department. Medical justification for home health services/DME verified.    Monina C. Somerset - NP    [employer] [phone]

## 2017-02-09 ENCOUNTER — Ambulatory Visit: Payer: Medicare Other | Admitting: Physical Therapy

## 2017-02-16 ENCOUNTER — Ambulatory Visit: Payer: Medicare Other | Admitting: Physical Therapy

## 2017-02-23 ENCOUNTER — Ambulatory Visit: Payer: Medicare Other | Admitting: Physical Therapy

## 2017-02-25 ENCOUNTER — Encounter: Payer: Self-pay | Admitting: Physical Therapy

## 2017-02-25 ENCOUNTER — Ambulatory Visit: Payer: Medicare Other | Attending: Internal Medicine | Admitting: Physical Therapy

## 2017-02-25 DIAGNOSIS — R2681 Unsteadiness on feet: Secondary | ICD-10-CM | POA: Diagnosis present

## 2017-02-25 DIAGNOSIS — M6281 Muscle weakness (generalized): Secondary | ICD-10-CM | POA: Insufficient documentation

## 2017-02-25 DIAGNOSIS — R2689 Other abnormalities of gait and mobility: Secondary | ICD-10-CM | POA: Insufficient documentation

## 2017-02-25 DIAGNOSIS — Z9181 History of falling: Secondary | ICD-10-CM | POA: Diagnosis present

## 2017-02-26 ENCOUNTER — Telehealth: Payer: Self-pay | Admitting: Physical Therapy

## 2017-02-26 NOTE — Telephone Encounter (Signed)
Ms. Therriault presented to Physical Therapy for evaluation yesterday. She appears unsafe for outpatient physical therapy with high fall risk. She lives alone and loads/unloads her wheelchair into her car then makes her way to driver's door holding onto the car. This increases her risk of falling with injury significantly.  Patient appears would be safer with Home Health Physical Therapy. She was pleased with provider used in February, [facility].  Can you please send a referral for Home Health Physical Therapy Evaluation & Treatment? Thank you Jamey Reas, PT, [place] [phone]

## 2017-02-26 NOTE — Therapy (Signed)
[facility] [address] [place], [place], [address] Phone: [phone]   Fax:  [phone]  Physical Therapy Evaluation  Patient Details  Name: Jody Simpson MRN: [id number] Date of Birth: 08/21/1950 Referring Provider: Delrae Rend, MD  Encounter Date: 02/25/2017      PT End of Session - 02/26/17 0645    Visit Number 1   Number of Visits 1   Authorization Type Medicare   PT Start Time 9381   PT Stop Time 1530   PT Time Calculation (min) 45 min   Equipment Utilized During Treatment Gait belt   Activity Tolerance Patient tolerated treatment well;Patient limited by fatigue   Behavior During Therapy [facility] for tasks assessed/performed      Past Medical History:  Diagnosis Date  . CAP (community acquired pneumonia)   . Diabetes mellitus without complication ([place])   . Fall at home    Down x48 hours  . GERD (gastroesophageal reflux disease)   . Gout   . Hyperkalemia    Mild  . Hyperlipidemia   . Hypertension   . IBS (irritable bowel syndrome)   . 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 Morbid obesity ([place])   . Neuropathy   . Non-traumatic rhabdomyolysis   . PAT (paroxysmal atrial tachycardia) ([place])   . Sinus arrhythmia     Past Surgical History:  Procedure Laterality Date  . kideny removal      There were no vitals filed for this visit.       Subjective Assessment - 02/25/17 1448    Subjective This 66yo female was referred to PT for evaluation for falls prevention & balance on 02/05/17 by Dr. Delrae Rend.    Pertinent History DM2, neuropathy,  HTN, Gout, Obseity, renal cell CA with left nephrectomy,    Limitations Lifting;Walking;Standing   Patient Stated Goals She moved into senior independent living apt. She would like to get around better without risk of falling.    Currently in Pain? No/denies            [facility] PT Assessment - 02/25/17 1445      Assessment   Medical  Diagnosis Falls, balance, gait abnormality   Referring Provider Delrae Rend, MD   Onset Date/Surgical Date 02/05/17  MD referral   Hand Dominance Right   Prior [place] in Feb 2018     Precautions   Precautions Fall  No lifting >20#     Restrictions   Weight Bearing Restrictions No     Balance Screen   Has the patient fallen in the past 6 months No   How many times? No falls since Jan. but several near falls in bathroom mainly.    Has the patient had a decrease in activity level because of a fear of falling?  Yes   Is the patient reluctant to leave their home because of a fear of falling?  Yes     [place] Private residence   Living Arrangements Alone   Type of Home Apartment  senior citizen apt   Home Access Ramped entrance;Elevator  inclined curb to main entrance, ~100' to elevator, 200' [place] Other (Comment);One level  single level apt on 2nd floor   [place] - 4 wheels;Walker - 2 wheels;Cane - quad;Bedside commode     Prior Function   Level of Independence Independent  prior to Jan fall, used Physiological scientist  to shop, w/c to MD   Vocation Retired   Leisure table games, go out to eat,      Observation/Other Assessments   Focus on Therapeutic Outcomes (FOTO)  40.51 Functional Status   Activities of Balance Confidence Scale (ABC Scale)  22.5%     Posture/Postural Control   Posture/Postural Control Postural limitations   Postural Limitations Rounded Shoulders;Forward head;Increased lumbar lordosis;Flexed trunk;Weight shift right   Posture Comments wide base common with morbid obesity     ROM / Strength   AROM / PROM / Strength AROM;Strength     AROM   Overall AROM  Deficits   Overall AROM Comments TIghtness in bilateral hip flexors, hamstrings & ankle plantarlexion     Strength   Overall Strength Deficits   Overall Strength Comments UE gross testing Lt shoulder 3-/5, Rt shoulder 3/5, Elbow  extension 4/5, flexion 5/5, fair grip   Strength Assessment Site Hip;Knee;Ankle   Right/Left Hip Right;Left   Right Hip Flexion 3-/5   Right Hip Extension 2-/5   Right Hip ABduction 2+/5   Left Hip Flexion 3-/5   Left Hip Extension 2-/5   Left Hip ABduction 2+/5   Right/Left Knee Right;Left   Right Knee Flexion 3-/5   Right Knee Extension 4/5   Left Knee Flexion 3-/5   Left Knee Extension 4/5   Right/Left Ankle Right;Left   Right Ankle Dorsiflexion 4/5   Left Ankle Dorsiflexion 3-/5  difficult to test due to fusion     Bed Mobility   Bed Mobility Rolling Right;Rolling Left;Right Sidelying to Sit;Left Sidelying to Sit;Sit to Supine   Rolling Right 6: Modified independent (Device/Increase time)  uses UEs to pull on edge of bed   Rolling Left 6: Modified independent (Device/Increase time)  uses UEs to pull on side of bed   Right Sidelying to Sit 6: Modified independent (Device/Increase time)  excessive use of UEs & increased time   Left Sidelying to Sit 6: Modified independent (Device/Increase time)  excessive use of UEs & increased time   Sit to Supine 6: Modified independent (Device/Increase time)  increased time & excessive UE use     Transfers   Transfers Sit to Stand;Stand to Sit;Squat Pivot Transfers   Sit to Stand 5: Supervision;With upper extremity assist;With armrests;From chair/3-in-1;Multiple attempts  requires touch on sturdy object like RW to stabilize   Stand to Sit 5: Supervision;With upper extremity assist;With armrests;To chair/3-in-1;Uncontrolled descent  requires RW for stabilzation   Squat Pivot Transfers 6: Modified independent (Device/Increase time);With upper extremity assistance;With armrests  multiple attempts partial stand to clear non-removable armre     Ambulation/Gait   Ambulation/Gait Yes   Ambulation/Gait Assistance 5: Supervision   Ambulation/Gait Assistance Details Excessive UE support on RW   Ambulation Distance (Feet) 75 Feet   Assistive  device Rollator   Gait Pattern Step-to pattern;Decreased step length - left;Decreased stance time - right;Decreased stride length;Decreased dorsiflexion - right;Decreased dorsiflexion - left;Right foot flat;Left foot flat;Trunk flexed;Poor foot clearance - left;Poor foot clearance - right  step length 6" BLEs   Ambulation Surface Indoor;Level   Gait velocity 0.36 ft/sec     Balance   Balance Assessed Yes     Static Standing Balance   Static Standing - Balance Support Bilateral upper extremity supported  locked rollator walker   Static Standing - Level of Assistance 6: Modified independent (Device/Increase time)   Static Standing - Comment/# of Minutes 2 minutes with BUE support;  attempted static stance without UE support but unable  to maintain for <3 sec.      Dynamic Standing Balance   Dynamic Standing - Balance Support Left upper extremity supported;Bilateral upper extremity supported  locked rollator walker, reaches with dominant UE   Dynamic Standing - Level of Assistance 5: Stand by assistance   Dynamic Standing - Balance Activities Head nods;Head turns;Eyes closed;Reaching for objects   Dynamic Standing - Comments reaches 5" anteriorly with dominant UE, reaches to knee level only towards floor, maintains upright with eyes closed for 10 seconds; looks over shoulders with cervical motion 75% & mild thoracic rotation, no wt shift on LEs     Standardized Balance Assessment   Standardized Balance Assessment Timed Up and Go Test     Timed Up and Go Test   Normal TUG (seconds) 38.11  rollator walker            Objective measurements completed on examination: See above findings.                               Plan - 2017/03/15 1759    Clinical Impression Statement This 66yo female has significant weakness & balance deficits making her fall risk very high. She has to use a w/c to access the community and loading/unloading a w/c along with support to  ambulate to driver's side of car increases risk of falls even more. Patient appears to need skilled PT but home health would be a safer option to minimize risk of injury to this patient.    Clinical Presentation Evolving   Clinical Decision Making Moderate   Rehab Potential Good   PT Frequency One time visit   PT Next Visit Plan PT recommends home health services due to high risk of falls & injury with community outings for this individual   Recommended Other [place] and Agree with Plan of Care Patient      Patient will benefit from skilled therapeutic intervention in order to improve the following deficits and impairments:  Abnormal gait, Decreased activity tolerance, Decreased balance, Decreased endurance, Decreased knowledge of use of DME, Decreased mobility, Decreased range of motion, Decreased strength, Obesity  Visit Diagnosis: Muscle weakness (generalized)  Other abnormalities of gait and mobility  Unsteadiness on feet  History of falling      G-Codes - March 15, 2017 1804    Functional Assessment Tool Used (Outpatient Only) Timed Up-Go 38.11sec with rollator walker & gait velocity 0.36 ft/sec with significant gait deviations   Functional Limitation Mobility: Walking and moving around   Mobility: Walking and Moving Around Current Status [phone]) At least 80 percent but less than 100 percent impaired, limited or restricted   Mobility: Walking and Moving Around Goal Status [phone]) At least 80 percent but less than 100 percent impaired, limited or restricted   Mobility: Walking and Moving Around Discharge Status [phone]) At least 80 percent but less than 100 percent impaired, limited or restricted       Problem List Patient Active Problem List   Diagnosis Date Noted  . CAP (community acquired pneumonia) 07/03/2016  . Obesity-BMI 51 07/03/2016  . Fall-down at home x 48 hrs 07/03/2016  . PAT (paroxysmal atrial tachycardia) ([place]) 07/03/2016  . COLONIC POLYPS  09/08/2007  . Gout 09/08/2007  . GASTROESOPHAGEAL REFLUX DISEASE 09/08/2007  . IRRITABLE BOWEL SYNDROME 09/08/2007  . 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 Hyperlipemia 07/28/2006  . 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 Diabetes  mellitus, type 2 ([place]) 07/21/2006  . Essential hypertension 07/21/2006  . DERMATITIS, ATOPIC 07/21/2006  . 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 [place], [place], [address] Phone: [phone]   Fax:  [phone]  Name: Jody Simpson MRN: [id number] Date of Birth: [birth year]

## 2017-06-23 DIAGNOSIS — N3000 Acute cystitis without hematuria: Secondary | ICD-10-CM | POA: Diagnosis not present

## 2017-06-23 DIAGNOSIS — N3020 Other chronic cystitis without hematuria: Secondary | ICD-10-CM | POA: Diagnosis not present

## 2017-08-07 DIAGNOSIS — Z8639 Personal history of other endocrine, nutritional and metabolic disease: Secondary | ICD-10-CM | POA: Diagnosis not present

## 2017-08-07 DIAGNOSIS — Z7984 Long term (current) use of oral hypoglycemic drugs: Secondary | ICD-10-CM | POA: Diagnosis not present

## 2017-08-07 DIAGNOSIS — E1142 Type 2 diabetes mellitus with diabetic polyneuropathy: Secondary | ICD-10-CM | POA: Diagnosis not present

## 2017-09-07 DIAGNOSIS — R8290 Unspecified abnormal findings in urine: Secondary | ICD-10-CM | POA: Diagnosis not present

## 2017-09-07 DIAGNOSIS — Z7984 Long term (current) use of oral hypoglycemic drugs: Secondary | ICD-10-CM | POA: Diagnosis not present

## 2017-09-07 DIAGNOSIS — E782 Mixed hyperlipidemia: Secondary | ICD-10-CM | POA: Diagnosis not present

## 2017-09-07 DIAGNOSIS — M109 Gout, unspecified: Secondary | ICD-10-CM | POA: Diagnosis not present

## 2017-09-07 DIAGNOSIS — E1142 Type 2 diabetes mellitus with diabetic polyneuropathy: Secondary | ICD-10-CM | POA: Diagnosis not present

## 2017-09-07 DIAGNOSIS — R269 Unspecified abnormalities of gait and mobility: Secondary | ICD-10-CM | POA: Diagnosis not present

## 2017-09-07 DIAGNOSIS — I10 Essential (primary) hypertension: Secondary | ICD-10-CM | POA: Diagnosis not present

## 2017-09-09 DIAGNOSIS — K589 Irritable bowel syndrome without diarrhea: Secondary | ICD-10-CM | POA: Diagnosis not present

## 2017-09-09 DIAGNOSIS — M109 Gout, unspecified: Secondary | ICD-10-CM | POA: Diagnosis not present

## 2017-09-09 DIAGNOSIS — R2689 Other abnormalities of gait and mobility: Secondary | ICD-10-CM | POA: Diagnosis not present

## 2017-09-09 DIAGNOSIS — E1142 Type 2 diabetes mellitus with diabetic polyneuropathy: Secondary | ICD-10-CM | POA: Diagnosis not present

## 2017-09-09 DIAGNOSIS — I10 Essential (primary) hypertension: Secondary | ICD-10-CM | POA: Diagnosis not present

## 2017-09-09 DIAGNOSIS — J45909 Unspecified asthma, uncomplicated: Secondary | ICD-10-CM | POA: Diagnosis not present

## 2017-09-15 DIAGNOSIS — K589 Irritable bowel syndrome without diarrhea: Secondary | ICD-10-CM | POA: Diagnosis not present

## 2017-09-15 DIAGNOSIS — I10 Essential (primary) hypertension: Secondary | ICD-10-CM | POA: Diagnosis not present

## 2017-09-15 DIAGNOSIS — J45909 Unspecified asthma, uncomplicated: Secondary | ICD-10-CM | POA: Diagnosis not present

## 2017-09-15 DIAGNOSIS — E1142 Type 2 diabetes mellitus with diabetic polyneuropathy: Secondary | ICD-10-CM | POA: Diagnosis not present

## 2017-09-15 DIAGNOSIS — M109 Gout, unspecified: Secondary | ICD-10-CM | POA: Diagnosis not present

## 2017-09-15 DIAGNOSIS — R2689 Other abnormalities of gait and mobility: Secondary | ICD-10-CM | POA: Diagnosis not present

## 2017-09-16 DIAGNOSIS — E1342 Other specified diabetes mellitus with diabetic polyneuropathy: Secondary | ICD-10-CM | POA: Diagnosis not present

## 2017-09-16 DIAGNOSIS — E1142 Type 2 diabetes mellitus with diabetic polyneuropathy: Secondary | ICD-10-CM | POA: Diagnosis not present

## 2017-09-18 DIAGNOSIS — K589 Irritable bowel syndrome without diarrhea: Secondary | ICD-10-CM | POA: Diagnosis not present

## 2017-09-18 DIAGNOSIS — M109 Gout, unspecified: Secondary | ICD-10-CM | POA: Diagnosis not present

## 2017-09-18 DIAGNOSIS — J45909 Unspecified asthma, uncomplicated: Secondary | ICD-10-CM | POA: Diagnosis not present

## 2017-09-18 DIAGNOSIS — E1142 Type 2 diabetes mellitus with diabetic polyneuropathy: Secondary | ICD-10-CM | POA: Diagnosis not present

## 2017-09-18 DIAGNOSIS — I10 Essential (primary) hypertension: Secondary | ICD-10-CM | POA: Diagnosis not present

## 2017-09-18 DIAGNOSIS — R2689 Other abnormalities of gait and mobility: Secondary | ICD-10-CM | POA: Diagnosis not present

## 2017-09-21 DIAGNOSIS — I10 Essential (primary) hypertension: Secondary | ICD-10-CM | POA: Diagnosis not present

## 2017-09-21 DIAGNOSIS — E1142 Type 2 diabetes mellitus with diabetic polyneuropathy: Secondary | ICD-10-CM | POA: Diagnosis not present

## 2017-09-21 DIAGNOSIS — R2689 Other abnormalities of gait and mobility: Secondary | ICD-10-CM | POA: Diagnosis not present

## 2017-09-21 DIAGNOSIS — J45909 Unspecified asthma, uncomplicated: Secondary | ICD-10-CM | POA: Diagnosis not present

## 2017-09-21 DIAGNOSIS — K589 Irritable bowel syndrome without diarrhea: Secondary | ICD-10-CM | POA: Diagnosis not present

## 2017-09-21 DIAGNOSIS — M109 Gout, unspecified: Secondary | ICD-10-CM | POA: Diagnosis not present

## 2017-09-22 DIAGNOSIS — H0100A Unspecified blepharitis right eye, upper and lower eyelids: Secondary | ICD-10-CM | POA: Insufficient documentation

## 2017-09-22 DIAGNOSIS — H02834 Dermatochalasis of left upper eyelid: Secondary | ICD-10-CM | POA: Diagnosis not present

## 2017-09-22 DIAGNOSIS — H02831 Dermatochalasis of right upper eyelid: Secondary | ICD-10-CM | POA: Diagnosis not present

## 2017-09-22 DIAGNOSIS — Z7984 Long term (current) use of oral hypoglycemic drugs: Secondary | ICD-10-CM | POA: Diagnosis not present

## 2017-09-22 DIAGNOSIS — H16223 Keratoconjunctivitis sicca, not specified as Sjogren's, bilateral: Secondary | ICD-10-CM | POA: Diagnosis not present

## 2017-09-22 DIAGNOSIS — H0100B Unspecified blepharitis left eye, upper and lower eyelids: Secondary | ICD-10-CM | POA: Diagnosis not present

## 2017-09-22 DIAGNOSIS — Z83511 Family history of glaucoma: Secondary | ICD-10-CM | POA: Insufficient documentation

## 2017-09-22 DIAGNOSIS — H2513 Age-related nuclear cataract, bilateral: Secondary | ICD-10-CM | POA: Diagnosis not present

## 2017-09-22 DIAGNOSIS — H04123 Dry eye syndrome of bilateral lacrimal glands: Secondary | ICD-10-CM | POA: Diagnosis not present

## 2017-09-22 DIAGNOSIS — E119 Type 2 diabetes mellitus without complications: Secondary | ICD-10-CM | POA: Diagnosis not present

## 2017-09-24 DIAGNOSIS — E1142 Type 2 diabetes mellitus with diabetic polyneuropathy: Secondary | ICD-10-CM | POA: Diagnosis not present

## 2017-09-24 DIAGNOSIS — K589 Irritable bowel syndrome without diarrhea: Secondary | ICD-10-CM | POA: Diagnosis not present

## 2017-09-24 DIAGNOSIS — M109 Gout, unspecified: Secondary | ICD-10-CM | POA: Diagnosis not present

## 2017-09-24 DIAGNOSIS — J45909 Unspecified asthma, uncomplicated: Secondary | ICD-10-CM | POA: Diagnosis not present

## 2017-09-24 DIAGNOSIS — R2689 Other abnormalities of gait and mobility: Secondary | ICD-10-CM | POA: Diagnosis not present

## 2017-09-24 DIAGNOSIS — I10 Essential (primary) hypertension: Secondary | ICD-10-CM | POA: Diagnosis not present

## 2017-09-29 DIAGNOSIS — E1142 Type 2 diabetes mellitus with diabetic polyneuropathy: Secondary | ICD-10-CM | POA: Diagnosis not present

## 2017-09-29 DIAGNOSIS — I10 Essential (primary) hypertension: Secondary | ICD-10-CM | POA: Diagnosis not present

## 2017-09-29 DIAGNOSIS — J45909 Unspecified asthma, uncomplicated: Secondary | ICD-10-CM | POA: Diagnosis not present

## 2017-09-29 DIAGNOSIS — M109 Gout, unspecified: Secondary | ICD-10-CM | POA: Diagnosis not present

## 2017-09-29 DIAGNOSIS — K589 Irritable bowel syndrome without diarrhea: Secondary | ICD-10-CM | POA: Diagnosis not present

## 2017-09-29 DIAGNOSIS — R2689 Other abnormalities of gait and mobility: Secondary | ICD-10-CM | POA: Diagnosis not present

## 2017-10-01 DIAGNOSIS — J45909 Unspecified asthma, uncomplicated: Secondary | ICD-10-CM | POA: Diagnosis not present

## 2017-10-01 DIAGNOSIS — K589 Irritable bowel syndrome without diarrhea: Secondary | ICD-10-CM | POA: Diagnosis not present

## 2017-10-01 DIAGNOSIS — E1142 Type 2 diabetes mellitus with diabetic polyneuropathy: Secondary | ICD-10-CM | POA: Diagnosis not present

## 2017-10-01 DIAGNOSIS — I10 Essential (primary) hypertension: Secondary | ICD-10-CM | POA: Diagnosis not present

## 2017-10-01 DIAGNOSIS — R2689 Other abnormalities of gait and mobility: Secondary | ICD-10-CM | POA: Diagnosis not present

## 2017-10-01 DIAGNOSIS — M109 Gout, unspecified: Secondary | ICD-10-CM | POA: Diagnosis not present

## 2017-10-06 DIAGNOSIS — K589 Irritable bowel syndrome without diarrhea: Secondary | ICD-10-CM | POA: Diagnosis not present

## 2017-10-06 DIAGNOSIS — I10 Essential (primary) hypertension: Secondary | ICD-10-CM | POA: Diagnosis not present

## 2017-10-06 DIAGNOSIS — J45909 Unspecified asthma, uncomplicated: Secondary | ICD-10-CM | POA: Diagnosis not present

## 2017-10-06 DIAGNOSIS — E1142 Type 2 diabetes mellitus with diabetic polyneuropathy: Secondary | ICD-10-CM | POA: Diagnosis not present

## 2017-10-06 DIAGNOSIS — M109 Gout, unspecified: Secondary | ICD-10-CM | POA: Diagnosis not present

## 2017-10-06 DIAGNOSIS — R2689 Other abnormalities of gait and mobility: Secondary | ICD-10-CM | POA: Diagnosis not present

## 2017-10-08 DIAGNOSIS — R2689 Other abnormalities of gait and mobility: Secondary | ICD-10-CM | POA: Diagnosis not present

## 2017-10-08 DIAGNOSIS — K589 Irritable bowel syndrome without diarrhea: Secondary | ICD-10-CM | POA: Diagnosis not present

## 2017-10-08 DIAGNOSIS — J45909 Unspecified asthma, uncomplicated: Secondary | ICD-10-CM | POA: Diagnosis not present

## 2017-10-08 DIAGNOSIS — I10 Essential (primary) hypertension: Secondary | ICD-10-CM | POA: Diagnosis not present

## 2017-10-08 DIAGNOSIS — E1142 Type 2 diabetes mellitus with diabetic polyneuropathy: Secondary | ICD-10-CM | POA: Diagnosis not present

## 2017-10-08 DIAGNOSIS — M109 Gout, unspecified: Secondary | ICD-10-CM | POA: Diagnosis not present

## 2017-10-15 DIAGNOSIS — R2689 Other abnormalities of gait and mobility: Secondary | ICD-10-CM | POA: Diagnosis not present

## 2017-10-15 DIAGNOSIS — M109 Gout, unspecified: Secondary | ICD-10-CM | POA: Diagnosis not present

## 2017-10-15 DIAGNOSIS — K589 Irritable bowel syndrome without diarrhea: Secondary | ICD-10-CM | POA: Diagnosis not present

## 2017-10-15 DIAGNOSIS — J45909 Unspecified asthma, uncomplicated: Secondary | ICD-10-CM | POA: Diagnosis not present

## 2017-10-15 DIAGNOSIS — E1142 Type 2 diabetes mellitus with diabetic polyneuropathy: Secondary | ICD-10-CM | POA: Diagnosis not present

## 2017-10-15 DIAGNOSIS — I10 Essential (primary) hypertension: Secondary | ICD-10-CM | POA: Diagnosis not present

## 2017-10-22 DIAGNOSIS — D044 Carcinoma in situ of skin of scalp and neck: Secondary | ICD-10-CM | POA: Diagnosis not present

## 2017-10-22 DIAGNOSIS — Z85828 Personal history of other malignant neoplasm of skin: Secondary | ICD-10-CM | POA: Diagnosis not present

## 2017-10-22 DIAGNOSIS — L570 Actinic keratosis: Secondary | ICD-10-CM | POA: Diagnosis not present

## 2017-10-22 DIAGNOSIS — D485 Neoplasm of uncertain behavior of skin: Secondary | ICD-10-CM | POA: Diagnosis not present

## 2017-10-30 DIAGNOSIS — R2689 Other abnormalities of gait and mobility: Secondary | ICD-10-CM | POA: Diagnosis not present

## 2017-10-30 DIAGNOSIS — E1142 Type 2 diabetes mellitus with diabetic polyneuropathy: Secondary | ICD-10-CM | POA: Diagnosis not present

## 2017-10-30 DIAGNOSIS — J45909 Unspecified asthma, uncomplicated: Secondary | ICD-10-CM | POA: Diagnosis not present

## 2017-10-30 DIAGNOSIS — M109 Gout, unspecified: Secondary | ICD-10-CM | POA: Diagnosis not present

## 2017-10-30 DIAGNOSIS — I10 Essential (primary) hypertension: Secondary | ICD-10-CM | POA: Diagnosis not present

## 2017-10-30 DIAGNOSIS — K589 Irritable bowel syndrome without diarrhea: Secondary | ICD-10-CM | POA: Diagnosis not present

## 2017-11-03 DIAGNOSIS — R2681 Unsteadiness on feet: Secondary | ICD-10-CM | POA: Diagnosis not present

## 2017-11-03 DIAGNOSIS — M6530 Trigger finger, unspecified finger: Secondary | ICD-10-CM | POA: Diagnosis not present

## 2017-11-09 DIAGNOSIS — E1142 Type 2 diabetes mellitus with diabetic polyneuropathy: Secondary | ICD-10-CM | POA: Diagnosis not present

## 2017-11-09 DIAGNOSIS — Z8639 Personal history of other endocrine, nutritional and metabolic disease: Secondary | ICD-10-CM | POA: Diagnosis not present

## 2017-11-17 DIAGNOSIS — E1142 Type 2 diabetes mellitus with diabetic polyneuropathy: Secondary | ICD-10-CM | POA: Diagnosis not present

## 2017-11-27 IMAGING — DX DG CHEST 2V
2 series · 2 of 2 positions shown · non-contrast
Comparison: 07/02/2016

CLINICAL DATA: Shortness of breath, recent fall

EXAM:
CHEST  2 VIEW

[chest lat]
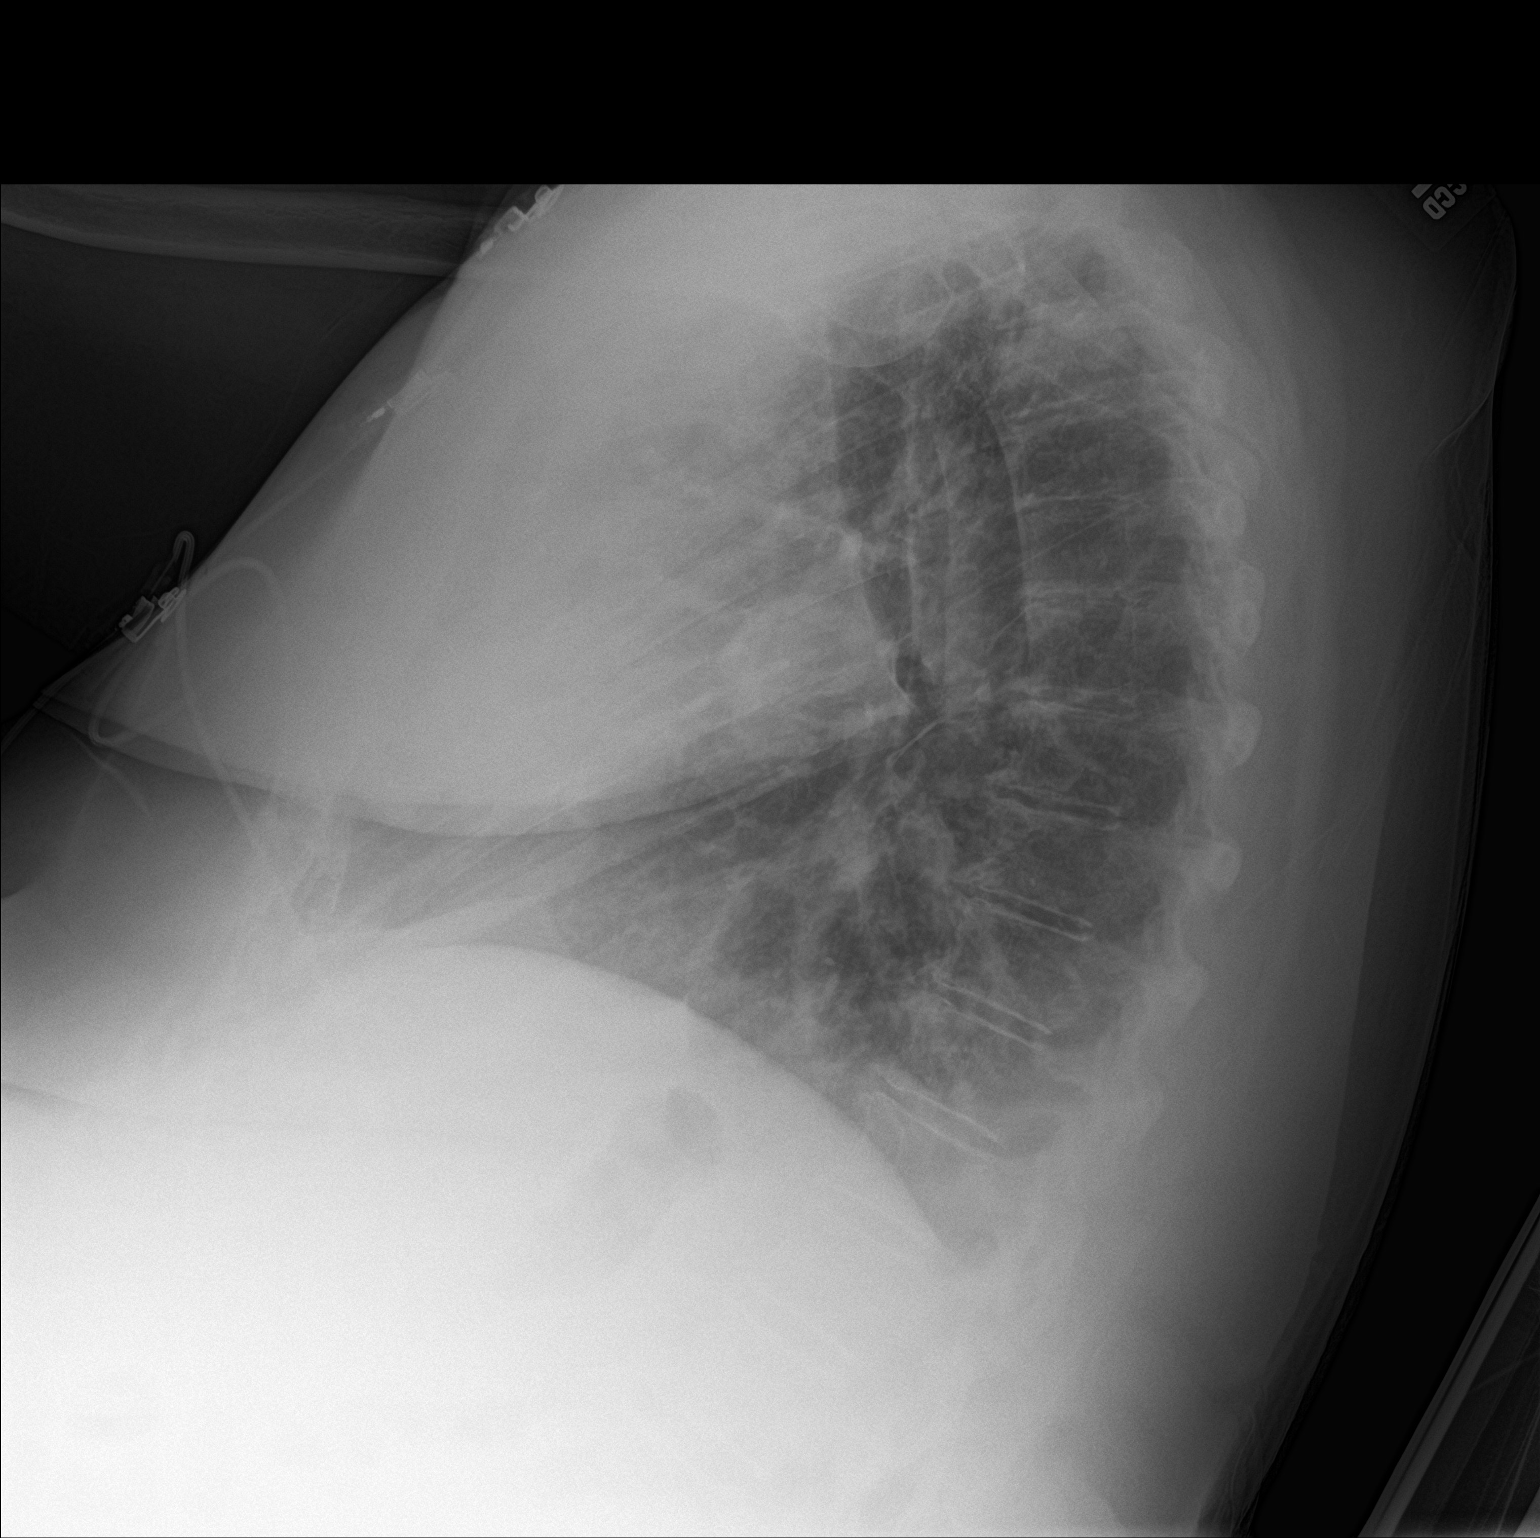

[chest ap]
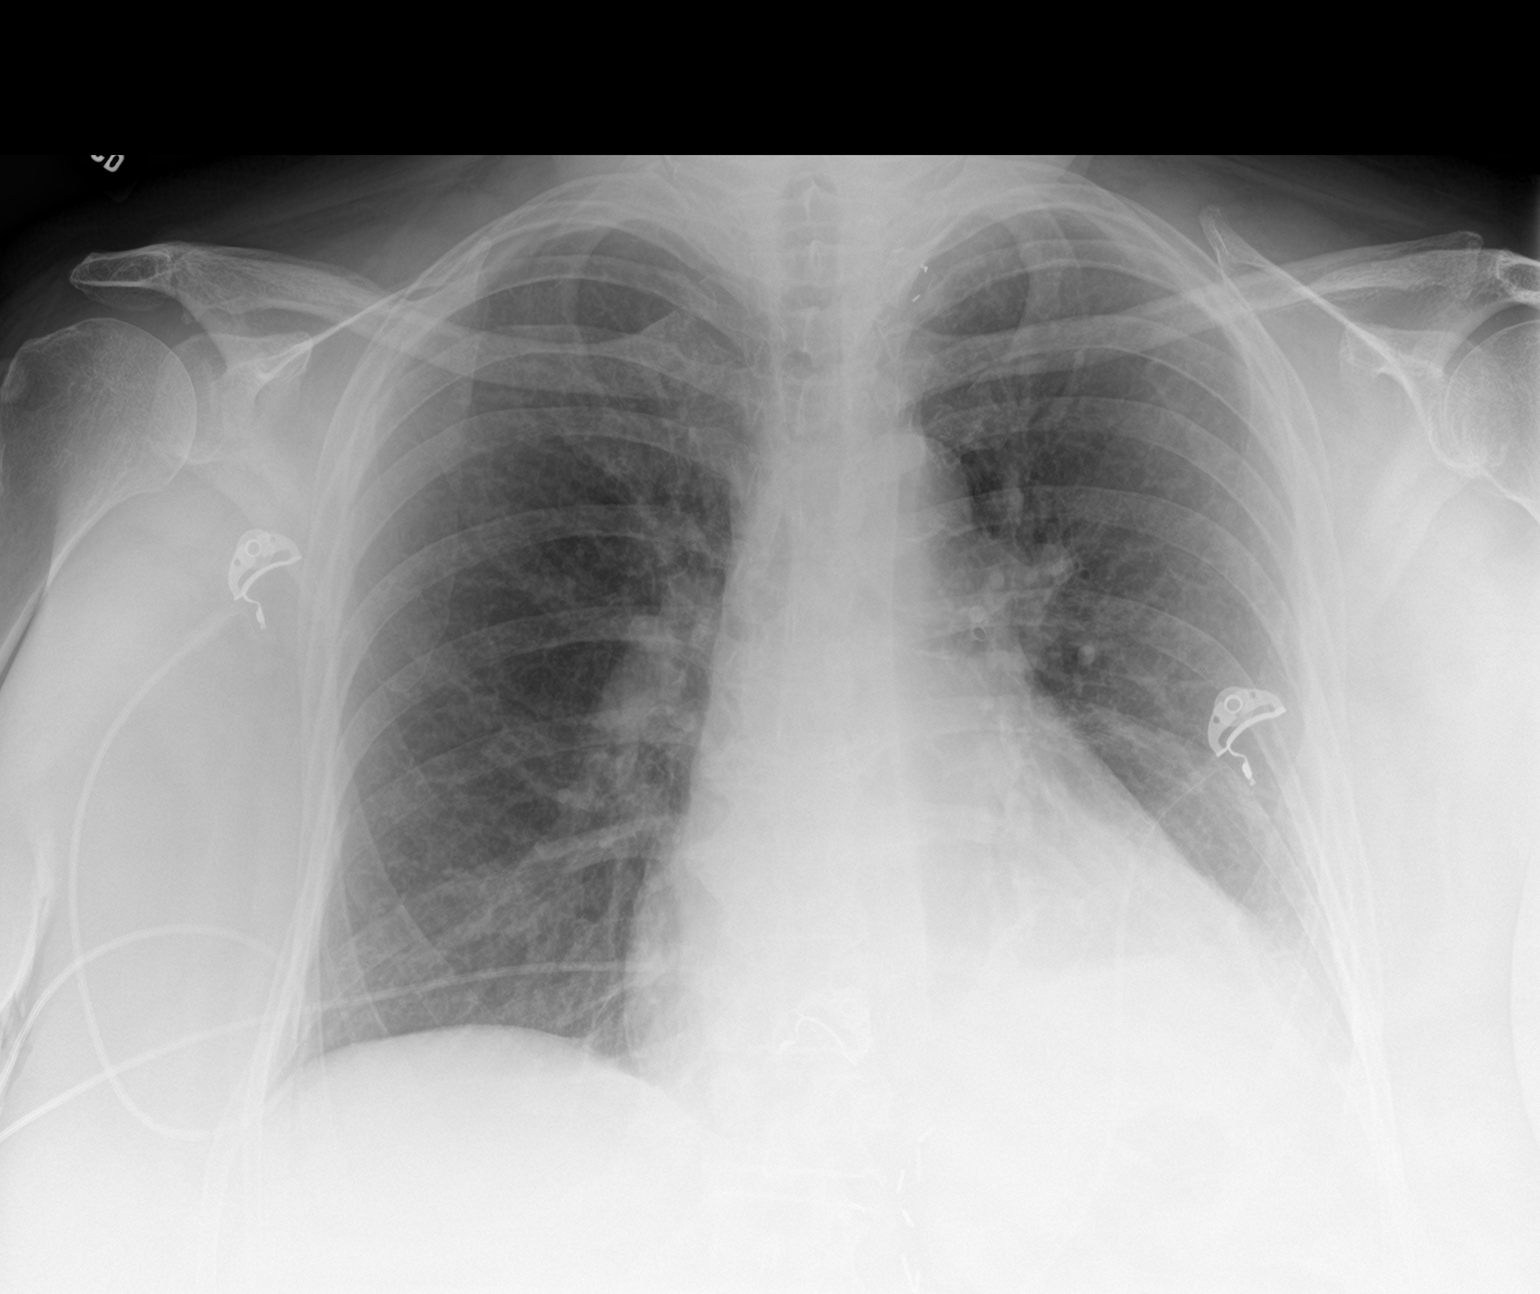

[2 of 2 positions shown; findings below may reference images not displayed]

FINDINGS: Cardiomegaly. Left base atelectasis. Right lung is clear. No
effusions or pneumothorax. No acute bony abnormality.
IMPRESSION: Cardiomegaly.  Left base atelectasis.

## 2017-12-04 DIAGNOSIS — M65341 Trigger finger, right ring finger: Secondary | ICD-10-CM | POA: Diagnosis not present

## 2017-12-04 DIAGNOSIS — M6530 Trigger finger, unspecified finger: Secondary | ICD-10-CM | POA: Insufficient documentation

## 2018-01-18 DIAGNOSIS — E1142 Type 2 diabetes mellitus with diabetic polyneuropathy: Secondary | ICD-10-CM | POA: Diagnosis not present

## 2018-01-18 DIAGNOSIS — Z794 Long term (current) use of insulin: Secondary | ICD-10-CM | POA: Diagnosis not present

## 2018-01-18 DIAGNOSIS — Z8639 Personal history of other endocrine, nutritional and metabolic disease: Secondary | ICD-10-CM | POA: Diagnosis not present

## 2018-02-09 DIAGNOSIS — E1142 Type 2 diabetes mellitus with diabetic polyneuropathy: Secondary | ICD-10-CM | POA: Diagnosis not present

## 2018-02-09 DIAGNOSIS — E782 Mixed hyperlipidemia: Secondary | ICD-10-CM | POA: Diagnosis not present

## 2018-02-09 DIAGNOSIS — M545 Low back pain: Secondary | ICD-10-CM | POA: Diagnosis not present

## 2018-02-15 DIAGNOSIS — E1142 Type 2 diabetes mellitus with diabetic polyneuropathy: Secondary | ICD-10-CM | POA: Diagnosis not present

## 2018-02-15 DIAGNOSIS — M109 Gout, unspecified: Secondary | ICD-10-CM | POA: Diagnosis not present

## 2018-02-15 DIAGNOSIS — M545 Low back pain: Secondary | ICD-10-CM | POA: Diagnosis not present

## 2018-02-15 DIAGNOSIS — I10 Essential (primary) hypertension: Secondary | ICD-10-CM | POA: Diagnosis not present

## 2018-02-15 DIAGNOSIS — K589 Irritable bowel syndrome without diarrhea: Secondary | ICD-10-CM | POA: Diagnosis not present

## 2018-02-19 DIAGNOSIS — I10 Essential (primary) hypertension: Secondary | ICD-10-CM | POA: Diagnosis not present

## 2018-02-19 DIAGNOSIS — M109 Gout, unspecified: Secondary | ICD-10-CM | POA: Diagnosis not present

## 2018-02-19 DIAGNOSIS — E1142 Type 2 diabetes mellitus with diabetic polyneuropathy: Secondary | ICD-10-CM | POA: Diagnosis not present

## 2018-02-19 DIAGNOSIS — M545 Low back pain: Secondary | ICD-10-CM | POA: Diagnosis not present

## 2018-02-19 DIAGNOSIS — K589 Irritable bowel syndrome without diarrhea: Secondary | ICD-10-CM | POA: Diagnosis not present

## 2018-02-23 DIAGNOSIS — K589 Irritable bowel syndrome without diarrhea: Secondary | ICD-10-CM | POA: Diagnosis not present

## 2018-02-23 DIAGNOSIS — I10 Essential (primary) hypertension: Secondary | ICD-10-CM | POA: Diagnosis not present

## 2018-02-23 DIAGNOSIS — E1142 Type 2 diabetes mellitus with diabetic polyneuropathy: Secondary | ICD-10-CM | POA: Diagnosis not present

## 2018-02-23 DIAGNOSIS — M545 Low back pain: Secondary | ICD-10-CM | POA: Diagnosis not present

## 2018-02-23 DIAGNOSIS — M109 Gout, unspecified: Secondary | ICD-10-CM | POA: Diagnosis not present

## 2018-02-26 DIAGNOSIS — M109 Gout, unspecified: Secondary | ICD-10-CM | POA: Diagnosis not present

## 2018-02-26 DIAGNOSIS — E1142 Type 2 diabetes mellitus with diabetic polyneuropathy: Secondary | ICD-10-CM | POA: Diagnosis not present

## 2018-02-26 DIAGNOSIS — M545 Low back pain: Secondary | ICD-10-CM | POA: Diagnosis not present

## 2018-02-26 DIAGNOSIS — K589 Irritable bowel syndrome without diarrhea: Secondary | ICD-10-CM | POA: Diagnosis not present

## 2018-02-26 DIAGNOSIS — I10 Essential (primary) hypertension: Secondary | ICD-10-CM | POA: Diagnosis not present

## 2018-03-01 DIAGNOSIS — E1142 Type 2 diabetes mellitus with diabetic polyneuropathy: Secondary | ICD-10-CM | POA: Diagnosis not present

## 2018-03-01 DIAGNOSIS — I10 Essential (primary) hypertension: Secondary | ICD-10-CM | POA: Diagnosis not present

## 2018-03-01 DIAGNOSIS — M109 Gout, unspecified: Secondary | ICD-10-CM | POA: Diagnosis not present

## 2018-03-01 DIAGNOSIS — M545 Low back pain: Secondary | ICD-10-CM | POA: Diagnosis not present

## 2018-03-01 DIAGNOSIS — K589 Irritable bowel syndrome without diarrhea: Secondary | ICD-10-CM | POA: Diagnosis not present

## 2018-03-04 DIAGNOSIS — M109 Gout, unspecified: Secondary | ICD-10-CM | POA: Diagnosis not present

## 2018-03-04 DIAGNOSIS — M545 Low back pain: Secondary | ICD-10-CM | POA: Diagnosis not present

## 2018-03-04 DIAGNOSIS — I10 Essential (primary) hypertension: Secondary | ICD-10-CM | POA: Diagnosis not present

## 2018-03-04 DIAGNOSIS — E1142 Type 2 diabetes mellitus with diabetic polyneuropathy: Secondary | ICD-10-CM | POA: Diagnosis not present

## 2018-03-04 DIAGNOSIS — K589 Irritable bowel syndrome without diarrhea: Secondary | ICD-10-CM | POA: Diagnosis not present

## 2018-03-17 DIAGNOSIS — M109 Gout, unspecified: Secondary | ICD-10-CM | POA: Diagnosis not present

## 2018-03-17 DIAGNOSIS — E1142 Type 2 diabetes mellitus with diabetic polyneuropathy: Secondary | ICD-10-CM | POA: Diagnosis not present

## 2018-03-17 DIAGNOSIS — K589 Irritable bowel syndrome without diarrhea: Secondary | ICD-10-CM | POA: Diagnosis not present

## 2018-03-17 DIAGNOSIS — M545 Low back pain: Secondary | ICD-10-CM | POA: Diagnosis not present

## 2018-03-17 DIAGNOSIS — I10 Essential (primary) hypertension: Secondary | ICD-10-CM | POA: Diagnosis not present

## 2018-04-02 DIAGNOSIS — I10 Essential (primary) hypertension: Secondary | ICD-10-CM | POA: Diagnosis not present

## 2018-04-02 DIAGNOSIS — M545 Low back pain: Secondary | ICD-10-CM | POA: Diagnosis not present

## 2018-04-02 DIAGNOSIS — K589 Irritable bowel syndrome without diarrhea: Secondary | ICD-10-CM | POA: Diagnosis not present

## 2018-04-02 DIAGNOSIS — M109 Gout, unspecified: Secondary | ICD-10-CM | POA: Diagnosis not present

## 2018-04-02 DIAGNOSIS — E1142 Type 2 diabetes mellitus with diabetic polyneuropathy: Secondary | ICD-10-CM | POA: Diagnosis not present

## 2018-04-06 DIAGNOSIS — M549 Dorsalgia, unspecified: Secondary | ICD-10-CM | POA: Diagnosis not present

## 2018-04-06 DIAGNOSIS — Z23 Encounter for immunization: Secondary | ICD-10-CM | POA: Diagnosis not present

## 2018-04-08 DIAGNOSIS — M65341 Trigger finger, right ring finger: Secondary | ICD-10-CM | POA: Diagnosis not present

## 2018-04-15 DIAGNOSIS — M545 Low back pain: Secondary | ICD-10-CM | POA: Diagnosis not present

## 2018-04-15 DIAGNOSIS — K589 Irritable bowel syndrome without diarrhea: Secondary | ICD-10-CM | POA: Diagnosis not present

## 2018-04-15 DIAGNOSIS — I10 Essential (primary) hypertension: Secondary | ICD-10-CM | POA: Diagnosis not present

## 2018-04-15 DIAGNOSIS — E1142 Type 2 diabetes mellitus with diabetic polyneuropathy: Secondary | ICD-10-CM | POA: Diagnosis not present

## 2018-04-15 DIAGNOSIS — M109 Gout, unspecified: Secondary | ICD-10-CM | POA: Diagnosis not present

## 2018-05-07 DIAGNOSIS — Z6841 Body Mass Index (BMI) 40.0 and over, adult: Secondary | ICD-10-CM | POA: Diagnosis not present

## 2018-05-07 DIAGNOSIS — M5136 Other intervertebral disc degeneration, lumbar region: Secondary | ICD-10-CM | POA: Diagnosis not present

## 2018-05-07 DIAGNOSIS — M545 Low back pain: Secondary | ICD-10-CM | POA: Diagnosis not present

## 2019-06-24 ENCOUNTER — Other Ambulatory Visit: Payer: Self-pay

## 2019-06-24 ENCOUNTER — Encounter: Payer: Self-pay | Admitting: Allergy & Immunology

## 2019-06-24 ENCOUNTER — Telehealth (INDEPENDENT_AMBULATORY_CARE_PROVIDER_SITE_OTHER): Payer: Medicare Other | Admitting: Allergy & Immunology

## 2019-06-24 DIAGNOSIS — J4520 Mild intermittent asthma, uncomplicated: Secondary | ICD-10-CM | POA: Diagnosis not present

## 2019-06-24 DIAGNOSIS — J302 Other seasonal allergic rhinitis: Secondary | ICD-10-CM | POA: Diagnosis not present

## 2019-06-24 DIAGNOSIS — T50Z95D Adverse effect of other vaccines and biological substances, subsequent encounter: Secondary | ICD-10-CM | POA: Diagnosis not present

## 2019-06-24 NOTE — Progress Notes (Signed)
NEW PATIENT VISIT   RE: Jody Simpson MRN: [id number] DOB: 01-24-1951 Date of Telemedicine Visit: 06/24/2019  Referring provider: Leighton Ruff, MD Primary care provider: Leighton Ruff, MD  Chief Complaint: Allergic Reaction (Vaccination wants to see if she has any hx of allergic reaction with the vaccine), Allergic Rhinitis , and Asthma   Telemedicine Follow Up Visit via WebEx: I connected with Jody Simpson for a follow up on 06/25/19 by [place] and verified that I am speaking with the correct person using two identifiers.   I discussed the limitations, risks, security and privacy concerns of performing an evaluation and management service by telemedicine and the availability of in person appointments. I also discussed with the patient that there may be a patient responsible charge related to this service. The patient expressed understanding and agreed to proceed.  Patient is at her home.  Provider is at the office.  Visit start time: 10:57 AM Visit end time: 11:32 AM Insurance consent/check in by: [employer] consent and medical assistant/nurse: Garlon Hatchet  History of Present Illness:  She is a 69 y.o. female, who presents today for an evaluation of an adverse vaccine reaction. Specifically, she is concerned with the COVID19 vaccine. She has an appointment for this coming Tuesday for the COVID vaccine. She is very motivated to get the vaccination and is inclined to go, but she wanted to discuss her previous reactions with an allergist before committing. She is planning to go with two other people and will be in the car with them (masked of course). They apparently keep everyone for 15 minutes and those with serious reaction histories for 30 minutes. [name]'s brother also is a Airline pilot and they are planning to park in the fire station lot "just in case".   She has had reactions to vaccines in the past. She has had a reaction to TDaP. She received the vaccination but  that night she had a fever and was "itching like crazy". She did take Tylenol and took Benadryl. She did not require any evaluation in the ED and never received epinephrine for this. She has not had a Tetanus shot since that time. She is unsure whether she has had DepoMedrol at all. This reaction occurred 15-20 years ago. She does not know the brand of the vaccine that she received.   She also had a reaction to CT dye. She had the CT imaging performed and then got to her car and "was itching like crazy". This occurred when she was diagnosed with the kidney cancer. This was 17 years ago. She has never had any CT scan dye since that time at all. She was only treated with a nephrectomy and never received any chemotherapy infusions at all.   She has undergone two colonoscopies and two sigmoidoscopies. During each of these times, she tolerated the bowel prep regimens without a problem.   She is allergic to latex. She has issues with hives when they use latex gloves.  She does have reactions to shampoos. She has had reactions to baby detergents. She has never used any kind of facial fillers to her knowledge. She has never had an EpiPen for any of these allergies.   Asthma/Respiratory Symptom History: She has an albuterol inhaler for her asthma. She goes through an albuterol inhaler around two per year. [name]'s asthma has been well controlled. She has not required rescue medication, experienced nocturnal awakenings due to lower respiratory symptoms, nor have activities of daily living been limited. She has  required no Emergency Department or Urgent Care visits for her asthma. She has required zero courses of systemic steroids for asthma exacerbations since the last visit. ACT score today is 25, indicating excellent asthma symptom control.   Allergic Rhinitis Symptom History: She is using her fluticasone on a PRN. She has Benaryl on hand to use as needed. She has the Claritin and the montelukast which she takes  every day. She did undergo allergy testing when she in her 75s. She had severe allergies and received allergy shots. She has trouble during allergy season. She does not require antibiotics often at all.     Patient Active Problem List   Diagnosis Date Noted  . CAP (community acquired pneumonia) 07/03/2016  . Obesity-BMI 51 07/03/2016  . Fall-down at home x 48 hrs 07/03/2016  . PAT (paroxysmal atrial tachycardia) ([place]) 07/03/2016  . COLONIC POLYPS 09/08/2007  . Gout 09/08/2007  . GASTROESOPHAGEAL REFLUX DISEASE 09/08/2007  . IRRITABLE BOWEL SYNDROME 09/08/2007  . 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 Hyperlipemia 07/28/2006  . 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 Diabetes mellitus, type 2 ([place]) 07/21/2006  . Essential hypertension 07/21/2006  . DERMATITIS, ATOPIC 07/21/2006  . 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 ABDOMINAL HYSTERECTOMY  1997  . [facility]  . kideny removal Left 1997   Renal Carcinoma cell  . PARATHYROIDECTOMY  1998    Family History  Problem Relation Age of Onset  . CAD Father        hx of CABG  . Heart disease Father   . Diabetes Brother   . Breast cancer Mother     Social history: She lives in an independent living apartment by herself. She has not pets at home. She was never a smoker. She worked as a Pharmacist, hospital in [employer]. There is no smoking exposure at all. She does not do drugs and rarely if ever drinks alcohol. She does have dust mite coverings on the bedding.   Assessment and Plan:  Kenisha is a 69 y.o. 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 is a delightful 69 y.o. female presenting for a discussion about her past history of reactions to vaccines, medications, and infusions. Specifically she is concerned with her possible reactions to COVID19  vaccines. In the two vaccines on the market [employer] and [employer] - the component thought to be the most allergenic is the PEG component. Reassuringly, she has tolerated multiple bowel prep formulations in the past, so I think that alone is likely enough to negate the need for testing. PEG has been rarely used in injectable medications, which is likely the reason of the seemingly increased reactions to the COVID19 vaccines. In contrast, polysorbate 80 is the stabilizer within the next two or three COVID19 vaccinations coming to the market; we have a lot more experience with polysorbate as a stabilizer in injectable medications.   I did ask her to call us after she receives the vaccine to let us know how she did. I do feel more comfortable knowing that she is going with other people that can seek help if needed. Patient is in agreement with this plan.   Diagnostics: None.  Medication List:  Current Outpatient Medications  Medication Sig Dispense Refill  . acetaminophen (TYLENOL) 325 MG tablet Take 650 mg by mouth  every 4 (four) hours as needed for mild pain.    Marland Kitchen allopurinol (ZYLOPRIM) 300 MG tablet TK 1 T PO QD  0  . atenolol (TENORMIN) 50 MG tablet Take 50 mg by mouth 2 (two) times daily.   0  . famotidine (PEPCID) 20 MG tablet Take 20 mg by mouth 2 (two) times daily.    Marland Kitchen glipiZIDE (GLUCOTROL) 10 MG tablet TK 1 T PO BID  0  . lisinopril-hydrochlorothiazide (PRINZIDE,ZESTORETIC) 20-25 MG tablet TK 1 T PO QD  0  . loratadine (CLARITIN) 10 MG tablet Take 10 mg by mouth daily.    . montelukast (SINGULAIR) 10 MG tablet Take 10 mg by mouth at bedtime.    Marland Kitchen albuterol (VENTOLIN HFA) 108 (90 Base) MCG/ACT inhaler Ventolin HFA 90 mcg/actuation aerosol inhaler    . baclofen (LIORESAL) 10 MG tablet baclofen 10 mg tablet  Take 1 tablet 4 times a day by oral route.    . fluticasone (FLONASE) 50 MCG/ACT nasal spray fluticasone propionate 50 mcg/actuation nasal spray,suspension  U 2 SPRAYS NASALLY PRN .    Marland Kitchen  Insulin Aspart FlexPen 100 UNIT/ML SOPN SMARTSIG:20 Unit(s) SUB-Q 3 Times Daily     No current facility-administered medications for this visit.   Allergies: Allergies  Allergen Reactions  . Aspirin     REACTION: stomach irritation  . Banana   . Cholestyramine     REACTION: Diarrhea and nausea  . Codeine     REACTION: stomach irritant  . Diazepam     REACTION: hyper  . Hydrocodone   . Ibuprofen Other (See Comments)  . Iodine   . Latex   . Meperidine Hcl   . Metformin     REACTION: Diarrhea  . Naproxen Sodium     REACTION: stomach irritant  . Penicillins   . Pravastatin Sodium     REACTION: Flu like symptoms  . Sitagliptin Phosphate     REACTION: Caused elevated CBGs   I reviewed her past medical history, social history, family history, and environmental history and no significant changes have been reported from previous visits.  Review of Systems  Constitutional: Negative for activity change, appetite change, chills and diaphoresis.  HENT: Negative for congestion, postnasal drip, rhinorrhea, sinus pressure and sore throat.   Eyes: Negative for pain, discharge, redness and itching.  Respiratory: Negative for shortness of breath, wheezing and stridor.   Gastrointestinal: Negative for diarrhea, nausea and vomiting.  Endocrine: Negative for cold intolerance and heat intolerance.  Musculoskeletal: Negative for arthralgias, joint swelling and myalgias.  Skin: Negative for rash.  Allergic/Immunologic: Positive for environmental allergies. Negative for food allergies and immunocompromised state.    Objective:  Physical exam not obtained as encounter was done via telephone.   Previous notes and tests were reviewed.  I discussed the assessment and treatment plan with the patient. The patient was provided an opportunity to ask questions and all were answered. The patient agreed with the plan and demonstrated an understanding of the instructions.   The patient was advised to  call back or seek an in-person evaluation if the symptoms worsen or if the condition fails to improve as anticipated.  I provided 35 minutes of non-face-to-face time during this encounter.  It was my pleasure to participate in Micaiah Brew care today. Please feel free to contact me with any questions or concerns.   Sincerely,  Valentina Shaggy, MD

## 2019-06-25 ENCOUNTER — Encounter: Payer: Self-pay | Admitting: Allergy & Immunology

## 2019-06-29 ENCOUNTER — Telehealth: Payer: Self-pay | Admitting: Allergy & Immunology

## 2019-06-29 NOTE — Telephone Encounter (Signed)
Called patient and she stated she realized what happened after she received her Covid-19 injection, the nurse asked her to hold a band-aid and she's allergic to all band-aids, her middle finger started itching while she was there, they did a full work-up on her and she was fine when she left, when she got home her arm where the band-aid was placed was itching, after she removed the band-aid everything was fine.

## 2019-06-29 NOTE — Telephone Encounter (Signed)
Pt called and said that she got the covid shot and had a reaction to it while she was there. [phone]

## 2019-06-30 NOTE — Telephone Encounter (Signed)
Thank you for getting that extra history, Lachelle!   Salvatore Marvel, MD [facility]

## 2020-07-16 DIAGNOSIS — E782 Mixed hyperlipidemia: Secondary | ICD-10-CM | POA: Diagnosis not present

## 2020-07-16 DIAGNOSIS — E1142 Type 2 diabetes mellitus with diabetic polyneuropathy: Secondary | ICD-10-CM | POA: Diagnosis not present

## 2020-07-16 DIAGNOSIS — Z794 Long term (current) use of insulin: Secondary | ICD-10-CM | POA: Diagnosis not present

## 2020-08-28 DIAGNOSIS — H5201 Hypermetropia, right eye: Secondary | ICD-10-CM | POA: Diagnosis not present

## 2020-08-28 DIAGNOSIS — H5212 Myopia, left eye: Secondary | ICD-10-CM | POA: Diagnosis not present

## 2020-08-28 DIAGNOSIS — Z794 Long term (current) use of insulin: Secondary | ICD-10-CM | POA: Diagnosis not present

## 2020-08-28 DIAGNOSIS — H52203 Unspecified astigmatism, bilateral: Secondary | ICD-10-CM | POA: Diagnosis not present

## 2020-08-28 DIAGNOSIS — H43813 Vitreous degeneration, bilateral: Secondary | ICD-10-CM | POA: Diagnosis not present

## 2020-08-28 DIAGNOSIS — E119 Type 2 diabetes mellitus without complications: Secondary | ICD-10-CM | POA: Diagnosis not present

## 2020-08-28 DIAGNOSIS — H2513 Age-related nuclear cataract, bilateral: Secondary | ICD-10-CM | POA: Diagnosis not present

## 2020-08-28 DIAGNOSIS — H524 Presbyopia: Secondary | ICD-10-CM | POA: Diagnosis not present

## 2020-08-28 DIAGNOSIS — H25042 Posterior subcapsular polar age-related cataract, left eye: Secondary | ICD-10-CM | POA: Diagnosis not present

## 2020-08-28 DIAGNOSIS — H25013 Cortical age-related cataract, bilateral: Secondary | ICD-10-CM | POA: Diagnosis not present

## 2020-09-14 DIAGNOSIS — L920 Granuloma annulare: Secondary | ICD-10-CM | POA: Diagnosis not present

## 2020-09-14 DIAGNOSIS — J309 Allergic rhinitis, unspecified: Secondary | ICD-10-CM | POA: Diagnosis not present

## 2020-09-14 DIAGNOSIS — I471 Supraventricular tachycardia: Secondary | ICD-10-CM | POA: Diagnosis not present

## 2020-09-14 DIAGNOSIS — E785 Hyperlipidemia, unspecified: Secondary | ICD-10-CM | POA: Diagnosis not present

## 2020-09-14 DIAGNOSIS — D849 Immunodeficiency, unspecified: Secondary | ICD-10-CM | POA: Diagnosis not present

## 2020-09-14 DIAGNOSIS — E119 Type 2 diabetes mellitus without complications: Secondary | ICD-10-CM | POA: Diagnosis not present

## 2020-09-14 DIAGNOSIS — E1165 Type 2 diabetes mellitus with hyperglycemia: Secondary | ICD-10-CM | POA: Diagnosis not present

## 2020-09-14 DIAGNOSIS — Z85528 Personal history of other malignant neoplasm of kidney: Secondary | ICD-10-CM | POA: Diagnosis not present

## 2020-09-14 DIAGNOSIS — I10 Essential (primary) hypertension: Secondary | ICD-10-CM | POA: Diagnosis not present

## 2020-09-14 DIAGNOSIS — E1169 Type 2 diabetes mellitus with other specified complication: Secondary | ICD-10-CM | POA: Diagnosis not present

## 2020-09-14 DIAGNOSIS — D8481 Immunodeficiency due to conditions classified elsewhere: Secondary | ICD-10-CM | POA: Diagnosis not present

## 2020-09-14 DIAGNOSIS — K219 Gastro-esophageal reflux disease without esophagitis: Secondary | ICD-10-CM | POA: Diagnosis not present

## 2020-09-24 ENCOUNTER — Other Ambulatory Visit: Payer: Self-pay | Admitting: Family Medicine

## 2020-09-24 DIAGNOSIS — Z1231 Encounter for screening mammogram for malignant neoplasm of breast: Secondary | ICD-10-CM

## 2022-06-15 ENCOUNTER — Other Ambulatory Visit: Payer: Self-pay

## 2022-06-15 ENCOUNTER — Emergency Department (HOSPITAL_COMMUNITY): Payer: Medicare HMO

## 2022-06-15 ENCOUNTER — Inpatient Hospital Stay (HOSPITAL_COMMUNITY)
Admission: EM | Admit: 2022-06-15 | Discharge: 2022-06-26 | DRG: 871 | Disposition: A | Discharge status: Skilled Nursing Facility | Payer: Medicare HMO | Attending: Internal Medicine | Admitting: Internal Medicine

## 2022-06-15 ENCOUNTER — Encounter (HOSPITAL_COMMUNITY): Payer: Self-pay

## 2022-06-15 DIAGNOSIS — Z751 Person awaiting admission to adequate facility elsewhere: Secondary | ICD-10-CM

## 2022-06-15 DIAGNOSIS — J101 Influenza due to other identified influenza virus with other respiratory manifestations: Secondary | ICD-10-CM | POA: Diagnosis present

## 2022-06-15 DIAGNOSIS — E1165 Type 2 diabetes mellitus with hyperglycemia: Secondary | ICD-10-CM | POA: Diagnosis present

## 2022-06-15 DIAGNOSIS — Z7984 Long term (current) use of oral hypoglycemic drugs: Secondary | ICD-10-CM

## 2022-06-15 DIAGNOSIS — Z90710 Acquired absence of both cervix and uterus: Secondary | ICD-10-CM

## 2022-06-15 DIAGNOSIS — E871 Hypo-osmolality and hyponatremia: Secondary | ICD-10-CM | POA: Diagnosis present

## 2022-06-15 DIAGNOSIS — E876 Hypokalemia: Secondary | ICD-10-CM | POA: Diagnosis present

## 2022-06-15 DIAGNOSIS — Z6841 Body Mass Index (BMI) 40.0 and over, adult: Secondary | ICD-10-CM

## 2022-06-15 DIAGNOSIS — R7989 Other specified abnormal findings of blood chemistry: Secondary | ICD-10-CM | POA: Diagnosis not present

## 2022-06-15 DIAGNOSIS — I447 Left bundle-branch block, unspecified: Secondary | ICD-10-CM | POA: Diagnosis present

## 2022-06-15 DIAGNOSIS — I872 Venous insufficiency (chronic) (peripheral): Secondary | ICD-10-CM | POA: Diagnosis present

## 2022-06-15 DIAGNOSIS — Z79899 Other long term (current) drug therapy: Secondary | ICD-10-CM

## 2022-06-15 DIAGNOSIS — Z794 Long term (current) use of insulin: Secondary | ICD-10-CM

## 2022-06-15 DIAGNOSIS — E785 Hyperlipidemia, unspecified: Secondary | ICD-10-CM | POA: Diagnosis present

## 2022-06-15 DIAGNOSIS — E1140 Type 2 diabetes mellitus with diabetic neuropathy, unspecified: Secondary | ICD-10-CM | POA: Diagnosis present

## 2022-06-15 DIAGNOSIS — Z888 Allergy status to other drugs, medicaments and biological substances status: Secondary | ICD-10-CM

## 2022-06-15 DIAGNOSIS — R0902 Hypoxemia: Secondary | ICD-10-CM | POA: Diagnosis present

## 2022-06-15 DIAGNOSIS — Z880 Allergy status to penicillin: Secondary | ICD-10-CM

## 2022-06-15 DIAGNOSIS — I441 Atrioventricular block, second degree: Secondary | ICD-10-CM | POA: Diagnosis present

## 2022-06-15 DIAGNOSIS — B952 Enterococcus as the cause of diseases classified elsewhere: Secondary | ICD-10-CM | POA: Diagnosis present

## 2022-06-15 DIAGNOSIS — Z885 Allergy status to narcotic agent status: Secondary | ICD-10-CM

## 2022-06-15 DIAGNOSIS — Z905 Acquired absence of kidney: Secondary | ICD-10-CM

## 2022-06-15 DIAGNOSIS — I110 Hypertensive heart disease with heart failure: Secondary | ICD-10-CM | POA: Diagnosis present

## 2022-06-15 DIAGNOSIS — L89312 Pressure ulcer of right buttock, stage 2: Secondary | ICD-10-CM | POA: Diagnosis present

## 2022-06-15 DIAGNOSIS — M6282 Rhabdomyolysis: Secondary | ICD-10-CM | POA: Diagnosis present

## 2022-06-15 DIAGNOSIS — E892 Postprocedural hypoparathyroidism: Secondary | ICD-10-CM | POA: Diagnosis present

## 2022-06-15 DIAGNOSIS — A419 Sepsis, unspecified organism: Principal | ICD-10-CM | POA: Diagnosis present

## 2022-06-15 DIAGNOSIS — Z981 Arthrodesis status: Secondary | ICD-10-CM

## 2022-06-15 DIAGNOSIS — N179 Acute kidney failure, unspecified: Secondary | ICD-10-CM | POA: Diagnosis present

## 2022-06-15 DIAGNOSIS — K219 Gastro-esophageal reflux disease without esophagitis: Secondary | ICD-10-CM | POA: Diagnosis present

## 2022-06-15 DIAGNOSIS — Z91040 Latex allergy status: Secondary | ICD-10-CM

## 2022-06-15 DIAGNOSIS — W07XXXA Fall from chair, initial encounter: Secondary | ICD-10-CM | POA: Diagnosis present

## 2022-06-15 DIAGNOSIS — I5033 Acute on chronic diastolic (congestive) heart failure: Secondary | ICD-10-CM | POA: Diagnosis present

## 2022-06-15 DIAGNOSIS — K580 Irritable bowel syndrome with diarrhea: Secondary | ICD-10-CM | POA: Diagnosis present

## 2022-06-15 DIAGNOSIS — Z8249 Family history of ischemic heart disease and other diseases of the circulatory system: Secondary | ICD-10-CM

## 2022-06-15 DIAGNOSIS — I2489 Other forms of acute ischemic heart disease: Secondary | ICD-10-CM | POA: Diagnosis not present

## 2022-06-15 DIAGNOSIS — Z91018 Allergy to other foods: Secondary | ICD-10-CM

## 2022-06-15 DIAGNOSIS — Z1152 Encounter for screening for COVID-19: Secondary | ICD-10-CM | POA: Diagnosis not present

## 2022-06-15 DIAGNOSIS — I5031 Acute diastolic (congestive) heart failure: Secondary | ICD-10-CM | POA: Diagnosis not present

## 2022-06-15 DIAGNOSIS — Z833 Family history of diabetes mellitus: Secondary | ICD-10-CM

## 2022-06-15 DIAGNOSIS — N390 Urinary tract infection, site not specified: Secondary | ICD-10-CM

## 2022-06-15 DIAGNOSIS — I495 Sick sinus syndrome: Secondary | ICD-10-CM | POA: Diagnosis present

## 2022-06-15 DIAGNOSIS — E860 Dehydration: Secondary | ICD-10-CM | POA: Diagnosis present

## 2022-06-15 DIAGNOSIS — Z886 Allergy status to analgesic agent status: Secondary | ICD-10-CM

## 2022-06-15 DIAGNOSIS — J111 Influenza due to unidentified influenza virus with other respiratory manifestations: Secondary | ICD-10-CM

## 2022-06-15 DIAGNOSIS — J9601 Acute respiratory failure with hypoxia: Secondary | ICD-10-CM | POA: Diagnosis present

## 2022-06-15 DIAGNOSIS — B9620 Unspecified Escherichia coli [E. coli] as the cause of diseases classified elsewhere: Secondary | ICD-10-CM | POA: Diagnosis present

## 2022-06-15 DIAGNOSIS — R17 Unspecified jaundice: Secondary | ICD-10-CM | POA: Diagnosis present

## 2022-06-15 DIAGNOSIS — M109 Gout, unspecified: Secondary | ICD-10-CM | POA: Diagnosis present

## 2022-06-15 DIAGNOSIS — Z85528 Personal history of other malignant neoplasm of kidney: Secondary | ICD-10-CM

## 2022-06-15 LAB — COMPREHENSIVE METABOLIC PANEL
ALT: 10 U/L (ref 0–44)
AST: 28 U/L (ref 15–41)
Albumin: 2.6 g/dL — ABNORMAL LOW (ref 3.5–5.0)
Alkaline Phosphatase: 92 U/L (ref 38–126)
Anion gap: 14 (ref 5–15)
BUN: 15 mg/dL (ref 8–23)
CO2: 22 mmol/L (ref 22–32)
Calcium: 7.7 mg/dL — ABNORMAL LOW (ref 8.9–10.3)
Chloride: 98 mmol/L (ref 98–111)
Creatinine, Ser: 1.33 mg/dL — ABNORMAL HIGH (ref 0.44–1.00)
GFR, Estimated: 43 mL/min — ABNORMAL LOW (ref >60)
Glucose, Bld: 361 mg/dL — ABNORMAL HIGH (ref 70–99)
Potassium: 4.7 mmol/L (ref 3.5–5.1)
Sodium: 134 mmol/L — ABNORMAL LOW (ref 135–145)
Total Bilirubin: 1.5 mg/dL — ABNORMAL HIGH (ref 0.3–1.2)
Total Protein: 5.9 g/dL — ABNORMAL LOW (ref 6.5–8.1)

## 2022-06-15 LAB — CBC WITH DIFFERENTIAL/PLATELET
Abs Immature Granulocytes: 0.11 10*3/uL — ABNORMAL HIGH (ref 0.00–0.07)
Basophils Absolute: 0.1 10*3/uL (ref 0.0–0.1)
Basophils Relative: 0 %
Eosinophils Absolute: 0 10*3/uL (ref 0.0–0.5)
Eosinophils Relative: 0 %
HCT: 35.8 % — ABNORMAL LOW (ref 36.0–46.0)
Hemoglobin: 11.1 g/dL — ABNORMAL LOW (ref 12.0–15.0)
Immature Granulocytes: 1 %
Lymphocytes Relative: 2 %
Lymphs Abs: 0.3 10*3/uL — ABNORMAL LOW (ref 0.7–4.0)
MCH: 29.8 pg (ref 26.0–34.0)
MCHC: 31 g/dL (ref 30.0–36.0)
MCV: 96.2 fL (ref 80.0–100.0)
Monocytes Absolute: 0.6 10*3/uL (ref 0.1–1.0)
Monocytes Relative: 5 %
Neutro Abs: 11.4 10*3/uL — ABNORMAL HIGH (ref 1.7–7.7)
Neutrophils Relative %: 92 %
Platelets: 213 10*3/uL (ref 150–400)
RBC: 3.72 MIL/uL — ABNORMAL LOW (ref 3.87–5.11)
RDW: 14.6 % (ref 11.5–15.5)
WBC: 12.5 10*3/uL — ABNORMAL HIGH (ref 4.0–10.5)
nRBC: 0 % (ref 0.0–0.2)

## 2022-06-15 LAB — URINALYSIS, ROUTINE W REFLEX MICROSCOPIC
Bilirubin Urine: NEGATIVE
Glucose, UA: 150 mg/dL — AB
Ketones, ur: 5 mg/dL — AB
Nitrite: POSITIVE — AB
Protein, ur: >=300 mg/dL — AB
RBC / HPF: >50 RBC/hpf — ABNORMAL HIGH (ref 0–5)
Specific Gravity, Urine: 1.019 (ref 1.005–1.030)
WBC, UA: >50 WBC/hpf — ABNORMAL HIGH (ref 0–5)
pH: 5 (ref 5.0–8.0)

## 2022-06-15 LAB — RESP PANEL BY RT-PCR (RSV, FLU A&B, COVID)  RVPGX2
Influenza A by PCR: POSITIVE — AB
Influenza B by PCR: NEGATIVE
Resp Syncytial Virus by PCR: NEGATIVE
SARS Coronavirus 2 by RT PCR: NEGATIVE

## 2022-06-15 LAB — HEMOGLOBIN A1C
Hgb A1c MFr Bld: 8.2 % — ABNORMAL HIGH (ref 4.8–5.6)
Mean Plasma Glucose: 188.64 mg/dL

## 2022-06-15 LAB — I-STAT VENOUS BLOOD GAS, ED
Acid-Base Excess: 0 mmol/L (ref 0.0–2.0)
Bicarbonate: 25.6 mmol/L (ref 20.0–28.0)
Calcium, Ion: 0.96 mmol/L — ABNORMAL LOW (ref 1.15–1.40)
HCT: 35 % — ABNORMAL LOW (ref 36.0–46.0)
Hemoglobin: 11.9 g/dL — ABNORMAL LOW (ref 12.0–15.0)
O2 Saturation: 79 %
Potassium: 4.8 mmol/L (ref 3.5–5.1)
Sodium: 134 mmol/L — ABNORMAL LOW (ref 135–145)
TCO2: 27 mmol/L (ref 22–32)
pCO2, Ven: 45.2 mmHg (ref 44–60)
pH, Ven: 7.362 (ref 7.25–7.43)
pO2, Ven: 45 mmHg (ref 32–45)

## 2022-06-15 LAB — CBC
HCT: 31.7 % — ABNORMAL LOW (ref 36.0–46.0)
Hemoglobin: 10.4 g/dL — ABNORMAL LOW (ref 12.0–15.0)
MCH: 30.8 pg (ref 26.0–34.0)
MCHC: 32.8 g/dL (ref 30.0–36.0)
MCV: 93.8 fL (ref 80.0–100.0)
Platelets: 189 10*3/uL (ref 150–400)
RBC: 3.38 MIL/uL — ABNORMAL LOW (ref 3.87–5.11)
RDW: 14.5 % (ref 11.5–15.5)
WBC: 10.3 10*3/uL (ref 4.0–10.5)
nRBC: 0 % (ref 0.0–0.2)

## 2022-06-15 LAB — TROPONIN I (HIGH SENSITIVITY)
Troponin I (High Sensitivity): 29 ng/L — ABNORMAL HIGH (ref <18)
Troponin I (High Sensitivity): 108 ng/L (ref <18)
Troponin I (High Sensitivity): 134 ng/L (ref <18)

## 2022-06-15 LAB — I-STAT CHEM 8, ED
BUN: 19 mg/dL (ref 8–23)
Calcium, Ion: 0.92 mmol/L — ABNORMAL LOW (ref 1.15–1.40)
Chloride: 99 mmol/L (ref 98–111)
Creatinine, Ser: 1.2 mg/dL — ABNORMAL HIGH (ref 0.44–1.00)
Glucose, Bld: 347 mg/dL — ABNORMAL HIGH (ref 70–99)
HCT: 36 % (ref 36.0–46.0)
Hemoglobin: 12.2 g/dL (ref 12.0–15.0)
Potassium: 4.8 mmol/L (ref 3.5–5.1)
Sodium: 134 mmol/L — ABNORMAL LOW (ref 135–145)
TCO2: 24 mmol/L (ref 22–32)

## 2022-06-15 LAB — HIV ANTIBODY (ROUTINE TESTING W REFLEX): HIV Screen 4th Generation wRfx: NONREACTIVE

## 2022-06-15 LAB — PROTIME-INR
INR: 1.1 (ref 0.8–1.2)
Prothrombin Time: 13.9 seconds (ref 11.4–15.2)

## 2022-06-15 LAB — GLUCOSE, CAPILLARY
Glucose-Capillary: 205 mg/dL — ABNORMAL HIGH (ref 70–99)
Glucose-Capillary: 208 mg/dL — ABNORMAL HIGH (ref 70–99)

## 2022-06-15 LAB — CBG MONITORING, ED
Glucose-Capillary: 267 mg/dL — ABNORMAL HIGH (ref 70–99)
Glucose-Capillary: 175 mg/dL — ABNORMAL HIGH (ref 70–99)

## 2022-06-15 LAB — CK: Total CK: 1000 U/L — ABNORMAL HIGH (ref 38–234)

## 2022-06-15 LAB — LACTIC ACID, PLASMA
Lactic Acid, Venous: 3.3 mmol/L (ref 0.5–1.9)
Lactic Acid, Venous: 1.7 mmol/L (ref 0.5–1.9)

## 2022-06-15 LAB — MAGNESIUM: Magnesium: 1.6 mg/dL — ABNORMAL LOW (ref 1.7–2.4)

## 2022-06-15 LAB — BRAIN NATRIURETIC PEPTIDE: B Natriuretic Peptide: 677.5 pg/mL — ABNORMAL HIGH (ref 0.0–100.0)

## 2022-06-15 LAB — MRSA NEXT GEN BY PCR, NASAL: MRSA by PCR Next Gen: NOT DETECTED

## 2022-06-15 LAB — APTT: aPTT: 28 seconds (ref 24–36)

## 2022-06-15 LAB — PHOSPHORUS: Phosphorus: 4.6 mg/dL (ref 2.5–4.6)

## 2022-06-15 MED ORDER — SODIUM CHLORIDE 0.9 % IV SOLN
2.0000 g | INTRAVENOUS | Status: DC
  Administered 2022-06-15 – 2022-06-19 (×5): 2 g via INTRAVENOUS
  Filled 2022-06-15 (×5): qty 20

## 2022-06-15 MED ORDER — MAGNESIUM SULFATE 2 GM/50ML IV SOLN
2.0000 g | Freq: Once | INTRAVENOUS | Status: AC
  Administered 2022-06-15: 2 g via INTRAVENOUS
  Filled 2022-06-15: qty 50

## 2022-06-15 MED ORDER — ACETAMINOPHEN 500 MG PO TABS
1000.0000 mg | ORAL_TABLET | Freq: Once | ORAL | Status: AC
  Administered 2022-06-15: 1000 mg via ORAL
  Filled 2022-06-15: qty 2

## 2022-06-15 MED ORDER — ENOXAPARIN SODIUM 40 MG/0.4ML IJ SOSY
40.0000 mg | PREFILLED_SYRINGE | INTRAMUSCULAR | Status: DC
  Administered 2022-06-15 – 2022-06-26 (×12): 40 mg via SUBCUTANEOUS
  Filled 2022-06-15 (×12): qty 0.4

## 2022-06-15 MED ORDER — ATENOLOL 50 MG PO TABS
50.0000 mg | ORAL_TABLET | Freq: Two times a day (BID) | ORAL | Status: DC
  Filled 2022-06-15: qty 2
  Filled 2022-06-15 (×2): qty 1

## 2022-06-15 MED ORDER — SODIUM CHLORIDE 0.9 % IV BOLUS (SEPSIS)
1000.0000 mL | Freq: Once | INTRAVENOUS | Status: AC
  Administered 2022-06-15: 1000 mL via INTRAVENOUS

## 2022-06-15 MED ORDER — IPRATROPIUM-ALBUTEROL 0.5-2.5 (3) MG/3ML IN SOLN
3.0000 mL | Freq: Four times a day (QID) | RESPIRATORY_TRACT | Status: DC
  Administered 2022-06-15 (×2): 3 mL via RESPIRATORY_TRACT
  Filled 2022-06-15 (×2): qty 3

## 2022-06-15 MED ORDER — FAMOTIDINE 20 MG PO TABS
20.0000 mg | ORAL_TABLET | Freq: Two times a day (BID) | ORAL | Status: DC
  Administered 2022-06-15 – 2022-06-26 (×23): 20 mg via ORAL
  Filled 2022-06-15 (×23): qty 1

## 2022-06-15 MED ORDER — ALBUTEROL SULFATE (2.5 MG/3ML) 0.083% IN NEBU
3.0000 mL | INHALATION_SOLUTION | Freq: Four times a day (QID) | RESPIRATORY_TRACT | Status: DC | PRN
  Administered 2022-06-18 – 2022-06-26 (×6): 3 mL via RESPIRATORY_TRACT
  Filled 2022-06-15 (×7): qty 3

## 2022-06-15 MED ORDER — OSELTAMIVIR PHOSPHATE 75 MG PO CAPS
75.0000 mg | ORAL_CAPSULE | Freq: Two times a day (BID) | ORAL | Status: AC
  Administered 2022-06-15 – 2022-06-17 (×6): 75 mg via ORAL
  Filled 2022-06-15 (×11): qty 1

## 2022-06-15 MED ORDER — INSULIN ASPART 100 UNIT/ML IJ SOLN
0.0000 [IU] | Freq: Three times a day (TID) | INTRAMUSCULAR | Status: DC
  Administered 2022-06-15: 3 [IU] via SUBCUTANEOUS
  Administered 2022-06-15: 5 [IU] via SUBCUTANEOUS
  Administered 2022-06-16: 2 [IU] via SUBCUTANEOUS
  Administered 2022-06-16: 3 [IU] via SUBCUTANEOUS
  Administered 2022-06-16: 1 [IU] via SUBCUTANEOUS
  Administered 2022-06-17: 2 [IU] via SUBCUTANEOUS
  Administered 2022-06-17: 3 [IU] via SUBCUTANEOUS
  Administered 2022-06-17 – 2022-06-18 (×2): 2 [IU] via SUBCUTANEOUS
  Administered 2022-06-18: 3 [IU] via SUBCUTANEOUS
  Administered 2022-06-18: 2 [IU] via SUBCUTANEOUS
  Administered 2022-06-19 (×2): 3 [IU] via SUBCUTANEOUS
  Administered 2022-06-19: 2 [IU] via SUBCUTANEOUS
  Administered 2022-06-20 (×2): 3 [IU] via SUBCUTANEOUS
  Administered 2022-06-20: 5 [IU] via SUBCUTANEOUS
  Administered 2022-06-21 – 2022-06-22 (×4): 3 [IU] via SUBCUTANEOUS
  Administered 2022-06-22: 2 [IU] via SUBCUTANEOUS
  Administered 2022-06-22: 3 [IU] via SUBCUTANEOUS
  Administered 2022-06-23: 2 [IU] via SUBCUTANEOUS
  Administered 2022-06-23: 3 [IU] via SUBCUTANEOUS
  Administered 2022-06-23 – 2022-06-25 (×4): 2 [IU] via SUBCUTANEOUS
  Administered 2022-06-25: 3 [IU] via SUBCUTANEOUS
  Administered 2022-06-26 (×2): 1 [IU] via SUBCUTANEOUS

## 2022-06-15 MED ORDER — SODIUM CHLORIDE 0.9 % IV SOLN: INTRAVENOUS | Status: DC

## 2022-06-15 MED ORDER — GUAIFENESIN-DM 100-10 MG/5ML PO SYRP
5.0000 mL | ORAL_SOLUTION | ORAL | Status: DC | PRN
  Administered 2022-06-16 – 2022-06-18 (×3): 5 mL via ORAL
  Filled 2022-06-15 (×3): qty 5

## 2022-06-15 MED ORDER — IPRATROPIUM-ALBUTEROL 0.5-2.5 (3) MG/3ML IN SOLN
3.0000 mL | Freq: Four times a day (QID) | RESPIRATORY_TRACT | Status: DC
  Administered 2022-06-16 (×2): 3 mL via RESPIRATORY_TRACT
  Filled 2022-06-15 (×3): qty 3

## 2022-06-15 MED ORDER — LACTATED RINGERS IV SOLN: INTRAVENOUS | Status: DC

## 2022-06-15 MED ORDER — INSULIN ASPART 100 UNIT/ML IJ SOLN
0.0000 [IU] | Freq: Every day | INTRAMUSCULAR | Status: DC
  Administered 2022-06-15 – 2022-06-20 (×3): 2 [IU] via SUBCUTANEOUS

## 2022-06-15 MED ORDER — MONTELUKAST SODIUM 10 MG PO TABS
10.0000 mg | ORAL_TABLET | Freq: Every day | ORAL | Status: DC
  Administered 2022-06-15 – 2022-06-25 (×11): 10 mg via ORAL
  Filled 2022-06-15 (×11): qty 1

## 2022-06-15 NOTE — Progress Notes (Signed)
Pt being followed by [facility] for Sepsis protocol. 

## 2022-06-15 NOTE — H&P (Addendum)
History and Physical  Jody Simpson:[id number] DOB: Oct 21, 1950 DOA: 06/15/2022  Referring physician: Dr. Leonette Monarch, [place] PCP: Leighton Ruff, MD (Inactive)  Outpatient Specialists: None. Patient coming from: Independent living facility.  Chief Complaint: Generalized weakness  HPI: Jody Simpson is a 72 y.o. female with medical history significant for SVT, essential hypertension, hyperlipidemia, GERD, type 2 diabetes, who presented to [facility] ED from independent living facility via EMS due to generalized weakness.  Endorses feeling so weak that she slid out of her chair and ended up on the floor.  States she was diagnosed with the flu on Friday.  At the facility, they could not get her up per their policy.  Reportedly, per ILF staff, patient was unresponsive on the floor.  EMS was called.  Upon EMS assessment vital signs notable for soft BPs and bradycardia with heart rate of 50.  In the ED, febrile with Tmax 101.2.  influenza A positive.  UA positive for pyuria.  Due to concern for sepsis with leukocytosis, urine culture and blood cultures were obtained.  She was started on Rocephin empirically.  Tamiflu was added.  ED Course: Tmax 101.2.  BP 162/147, pulse 67, respiratory rate 27, O2 saturation 96% on 4 L.  Lab studies significant for sodium 134, glucose 347, creatinine 1.20.  BNP 677.  CPK 1000.  Troponin 29.  WBC 12.5.  Hemoglobin 11.1.  Review of Systems: Review of systems as noted in the HPI. All other systems reviewed and are negative.   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 She has never used smokeless tobacco. She reports that she does not drink alcohol and does not use drugs.   Allergies  Allergen Reactions   Aspirin     REACTION: stomach irritation   Banana    Cholestyramine     REACTION: Diarrhea and nausea   Codeine     REACTION: stomach irritant   Diazepam     REACTION: hyper   Hydrocodone    Ibuprofen Other (See Comments)   Iodine    Latex    Meperidine Hcl    Metformin     REACTION: Diarrhea   Naproxen Sodium     REACTION: stomach irritant   Penicillins    Pravastatin Sodium     REACTION: Flu like symptoms   Sitagliptin Phosphate     REACTION: Caused elevated CBGs    Family History  Problem Relation Age of Onset   CAD Father        hx of CABG   Heart disease Father    Diabetes Brother  Breast cancer Mother       Prior to Admission medications   Medication Sig Start Date End Date Taking? Authorizing Provider  acetaminophen (TYLENOL) 325 MG tablet Take 650 mg by mouth every 4 (four) hours as needed for mild pain.    [provider]  albuterol (VENTOLIN HFA) 108 (90 Base) MCG/ACT inhaler Ventolin HFA 90 mcg/actuation aerosol inhaler    [provider]  allopurinol (ZYLOPRIM) 300 MG tablet TK 1 T PO QD 04/11/16   [provider]  atenolol (TENORMIN) 50 MG tablet Take 50 mg by mouth 2 (two) times daily.  06/17/16   [provider]  baclofen (LIORESAL) 10 MG tablet baclofen 10 mg tablet  Take 1 tablet 4 times a day by oral route.    [provider]  famotidine (PEPCID) 20 MG tablet Take 20 mg by mouth 2 (two) times daily.    [provider]   fluticasone (FLONASE) 50 MCG/ACT nasal spray fluticasone propionate 50 mcg/actuation nasal spray,suspension  U 2 SPRAYS NASALLY PRN .    [provider]  glipiZIDE (GLUCOTROL) 10 MG tablet TK 1 T PO BID 04/14/16   [provider]  Insulin Aspart FlexPen 100 UNIT/ML SOPN SMARTSIG:20 Unit(s) SUB-Q 3 Times Daily 06/16/19   [provider]  lisinopril-hydrochlorothiazide (PRINZIDE,ZESTORETIC) 20-25 MG tablet TK 1 T PO QD 06/17/16   [provider]  loratadine (CLARITIN) 10 MG tablet Take 10 mg by mouth daily.    [provider]  montelukast (SINGULAIR) 10 MG tablet Take 10 mg by mouth at bedtime.    [provider]    Physical Exam: BP (!) 182/50   Pulse 61   Temp (!) 101.2 F (38.4 C) (Rectal)   Resp (!) 28   Wt 136.1 kg   SpO2 98%   BMI 46.99 kg/m   General: 72 y.o. year-old female well developed well nourished in no acute distress.  Alert and oriented x3. Cardiovascular: Regular rate and rhythm with no rubs or gallops.  No thyromegaly or JVD noted.  Trace lower extremity edema. 2/4 pulses in all 4 extremities. Respiratory: Mild wheezing bilaterally.  Poor inspiratory effort. Abdomen: Soft nontender nondistended with normal bowel sounds x4 quadrants. Muskuloskeletal: No cyanosis, clubbing or edema noted bilaterally Neuro: CN II-XII intact, strength, sensation, reflexes Skin: No ulcerative lesions noted or rashes Psychiatry: Judgement and insight appear normal. Mood is appropriate for condition and setting          Labs on Admission:  Basic Metabolic Panel: Recent Labs  Lab 06/15/22 0055 06/15/22 0115  NA 134* 134*  134*  K 4.7 4.8  4.8  CL 98 99  CO2 22  --   GLUCOSE 361* 347*  BUN 15 19  CREATININE 1.33* 1.20*  CALCIUM 7.7*  --    Liver Function Tests: Recent Labs  Lab 06/15/22 0055  AST 28  ALT 10  ALKPHOS 92  BILITOT 1.5*  PROT 5.9*  ALBUMIN 2.6*   No results for input(s): "LIPASE", "AMYLASE" in the last  168 hours. No results for input(s): "AMMONIA" in the last 168 hours. CBC: Recent Labs  Lab 06/15/22 0055 06/15/22 0115  WBC 12.5*  --   NEUTROABS 11.4*  --   HGB 11.1* 12.2  11.9*  HCT 35.8* 36.0  35.0*  MCV 96.2  --   PLT 213  --    Cardiac Enzymes: Recent Labs  Lab 06/15/22 0055  CKTOTAL 1,000*    BNP (last 3 results) No results for  input(s): "BNP" in the last 8760 hours.  ProBNP (last 3 results) No results for input(s): "PROBNP" in the last 8760 hours.  CBG: No results for input(s): "GLUCAP" in the last 168 hours.  Radiological Exams on Admission: CT Head Wo Contrast  Result Date: 06/15/2022 CLINICAL DATA:  Head trauma, minor (Age >= 65y) EXAM: CT HEAD WITHOUT CONTRAST TECHNIQUE: Contiguous axial images were obtained from the base of the skull through the vertex without intravenous contrast. RADIATION DOSE REDUCTION: This exam was performed according to the departmental dose-optimization program which includes automated exposure control, adjustment of the mA and/or kV according to patient size and/or use of iterative reconstruction technique. COMPARISON:  None Available. FINDINGS: Brain: No evidence of large-territorial acute infarction. No parenchymal hemorrhage. No mass lesion. No extra-axial collection. No mass effect or midline shift. No hydrocephalus. Basilar cisterns are patent. Vascular: No hyperdense vessel. Skull: No acute fracture or focal lesion. Sinuses/Orbits: Paranasal sinuses and mastoid air cells are clear. Left lens replacement. Otherwise the orbits are unremarkable. Other: None. IMPRESSION: No acute intracranial abnormality. Electronically Signed   By: Iven Finn M.D.   On: 06/15/2022 02:28   DG Chest Port 1 View  Result Date: 06/15/2022 CLINICAL DATA:  Questionable sepsis. EXAM: PORTABLE CHEST 1 VIEW COMPARISON:  AP Lat 07/04/2016. FINDINGS: The heart is moderately enlarged. There is mild central vascular prominence without overt edema. The lungs are  clear of infiltrates. There mild chronic interstitial changes. The mediastinum is normally outlined. There is calcification in the aortic arch. Degenerative changes and slight dextroscoliosis thoracic spine. IMPRESSION: 1. Cardiomegaly with mild central vascular prominence but no overt edema. 2. No other evidence of acute chest disease.  Chronic change. Electronically Signed   By: Telford Nab M.D.   On: 06/15/2022 01:38   DG Pelvis Portable  Result Date: 06/15/2022 CLINICAL DATA:  Fall EXAM: PORTABLE PELVIS 1-2 VIEWS COMPARISON:  04/09/2010 FINDINGS: Symmetric degenerative changes in the hips. No acute bony abnormality. Specifically, no fracture, subluxation, or dislocation. IMPRESSION: No acute bony abnormality. Electronically Signed   By: Rolm Baptise M.D.   On: 06/15/2022 01:35    EKG: I independently viewed the EKG done and my findings are as followed: Sinus rhythm rate of 64.  LBBB.  QTc 599.  Assessment/Plan Present on Admission:  Sepsis secondary to UTI [facility])  Principal Problem:   Sepsis secondary to UTI ([place])  Sepsis secondary to UTI, POA Fever with Tmax 101.2, leukocytosis 12.5. Follow urine culture, peripheral blood cultures x 2 Continue Rocephin empirically Obtain MRSA screening test  Generalized weakness, suspect multifactorial Treat underlying conditions PT OT assessment Fall precautions  Elevated troponin with LBBB, unclear if new Denies any anginal symptoms Cardiology consulted, Dr. Rise Mu, via secure chat. Follow 2D echo Monitor on telemetry  Influenza A viral infection Tamiflu 75 mg twice daily x 5 days DuoNebs every 6 hours  Acute hypoxic respiratory failure secondary to influenza A viral infection O2 saturation in the 80s at the independent living facility Requiring 2 L to maintain O2 saturation greater than 92%. Incentive spirometer, bronchodilators.  AKI, suspect prerenal in the setting of dehydration Baseline creatinine 1.0 Presented with  creatinine 1.3.  GFR 43. Received adequate IV fluid hydration Monitor urine output Avoid nephrotoxic agents and hypotension. Creatinine down trending 1.20.  Type 2 diabetes with hyperglycemia Serum glucose 347 Obtain hemoglobin A1c Start insulin sliding scale.  Possible syncope Reportedly, per ILF staff patient was unresponsive on the floor. Obtain orthostatic vital signs Follow 2D echo Closely monitor  on telemetry Fall precautions  Cardiomegaly, elevated BNP BNP 677 Hold off IV fluid Follow 2D echo  Elevated CPK CPK 1000 Received adequate IV fluid Holding off additional IV fluid for now  Isolated hyperbilirubinemia AST ALT and alkaline phosphatase normal. T. bili 1.5 Monitor for now   Critical care time: 55 minutes.    DVT prophylaxis: Subcu Lovenox daily  Code Status: Full code  Family Communication: None at bedside  Disposition Plan: Admitted to telemetry medical unit  Consults called: Cardiology.  Admission status: Inpatient status.   Status is: Inpatient The patient requires at least 2 midnights for further evaluation and treatment of present condition.   Kayleen Memos MD [facility] Pager [phone]  If 7PM-7AM, please contact night-coverage [email or web address] Password [email or web address]  06/15/2022, 5:00 AM

## 2022-06-15 NOTE — ED Provider Notes (Signed)
[facility] EMERGENCY DEPARTMENT Provider Note  CSN: [id number] Arrival date & time: 06/15/22 0043  Chief Complaint(s) Altered Mental Status BIB [facility] from [facility]. Patient slid out of chair and called for assistance. Per SNF staff patient was unresponsive on the floor. Upon EMS arrival patient was responsive. Patient is GCS 15 upon arrival to ED.   HPI MONEISHA VOSLER is a 72 y.o. female with a past medical history listed below including hypertension, hyperlipidemia, diabetes, obesity, paroxysmal atrial tachycardia who presents to the emergency department after being found down and unresponsive by independent living staff.  EMS called for assistance.  Patient had regained consciousness at that time.  They noted patient's initial blood pressure was in the 90s pout which improved with IV fluid bolus.  They also noted oxygen saturations in the 80s on room air which improved after being on nonrebreather.  On arrival patient was alert and oriented.  Able to answer questions.  She reports that she has been sick for several days stating that she had the flu.  She states she has been feeling extremely fatigued and weak.  She has had decreased p.o. intake and has been sleeping most of the day.  She is unsure whether she had a mechanical fall.  She states that she was trying to get to her chair in the living room but could not make it.  She denies any vomiting or diarrhea.  Denies any dysuria but has strong urine smell.  The history is provided by the patient.    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)    Morbid obesity (HCC)    Neuropathy    Non-traumatic rhabdomyolysis    PAT (paroxysmal atrial tachycardia)    Sinus arrhythmia    Patient Active Problem List   Diagnosis Date Noted   Sepsis secondary to UTI ([place]) 06/15/2022   CAP (community acquired pneumonia) 07/03/2016   Obesity-BMI 51 07/03/2016   Fall-down at home x 48 hrs 07/03/2016   PAT (paroxysmal atrial tachycardia) 07/03/2016   COLONIC POLYPS 09/08/2007   Gout 09/08/2007   GASTROESOPHAGEAL REFLUX DISEASE 09/08/2007   IRRITABLE BOWEL SYNDROME 09/08/2007   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 Authorizing Provider  acetaminophen (TYLENOL) 325 MG tablet Take 650 mg by mouth every 4 (four) hours as needed for mild pain.    [provider]  albuterol (VENTOLIN HFA) 108 (90 Base) MCG/ACT inhaler Ventolin HFA 90 mcg/actuation aerosol inhaler    [provider]  allopurinol (ZYLOPRIM) 300 MG tablet TK 1 T PO QD 04/11/16   [provider]  atenolol (TENORMIN) 50 MG tablet Take 50 mg by mouth 2 (two) times daily.  06/17/16   [provider]  baclofen (  LIORESAL) 10 MG tablet baclofen 10 mg tablet  Take 1 tablet 4 times a day by oral route.    [provider]  famotidine (PEPCID) 20 MG tablet Take 20 mg by mouth 2 (two) times daily.    [provider]  fluticasone (FLONASE) 50 MCG/ACT nasal spray fluticasone propionate 50 mcg/actuation nasal spray,suspension  U 2 SPRAYS NASALLY PRN .    [provider]  glipiZIDE (GLUCOTROL) 10 MG tablet TK 1 T PO BID 04/14/16   [provider]  Insulin Aspart FlexPen 100 UNIT/ML SOPN SMARTSIG:20  Unit(s) SUB-Q 3 Times Daily 06/16/19   [provider]  lisinopril-hydrochlorothiazide (PRINZIDE,ZESTORETIC) 20-25 MG tablet TK 1 T PO QD 06/17/16   [provider]  loratadine (CLARITIN) 10 MG tablet Take 10 mg by mouth daily.    [provider]  montelukast (SINGULAIR) 10 MG tablet Take 10 mg by mouth at bedtime.    [provider]                                                                                                                                    Allergies Aspirin, Banana, Cholestyramine, Codeine, Diazepam, Hydrocodone, Ibuprofen, Iodine, Latex, Meperidine hcl, Metformin, Naproxen sodium, Penicillins, Pravastatin sodium, and Sitagliptin phosphate  Review of Systems Review of Systems As noted in HPI  Physical Exam Vital Signs  I have reviewed the triage vital signs BP (!) 182/50   Pulse 61   Temp (!) 101.2 F (38.4 C) (Rectal)   Resp (!) 28   Wt 136.1 kg   SpO2 98%   BMI 46.99 kg/m   Physical Exam Vitals reviewed.  Constitutional:      General: She is not in acute distress.    Appearance: She is well-developed. She is morbidly obese. She is not diaphoretic.  HENT:     Head: Normocephalic and atraumatic.     Nose: Nose normal.  Eyes:     General: No scleral icterus.       Right eye: No discharge.        Left eye: No discharge.     Conjunctiva/sclera: Conjunctivae normal.     Pupils: Pupils are equal, round, and reactive to light.  Cardiovascular:     Rate and Rhythm: Normal rate and regular rhythm.     Heart sounds: No murmur heard.    No friction rub. No gallop.  Pulmonary:     Effort: Pulmonary effort is normal. No respiratory distress.     Breath sounds: Normal breath sounds. No stridor. No rales.  Abdominal:     General: There is no distension.     Palpations: Abdomen is soft.     Tenderness: There is no abdominal tenderness.  Musculoskeletal:        General: No tenderness.     Cervical back: Normal range of  motion and neck supple.  Skin:    General: Skin is warm and  dry.     Findings: Rash (under bilateral breasts and inner thighs) present. No erythema. Rash is macular and scaling.  Neurological:     Mental Status: She is alert and oriented to person, place, and time.     ED Results and Treatments Labs (all labs ordered are listed, but only abnormal results are displayed) Labs Reviewed  RESP PANEL BY RT-PCR (RSV, FLU A&B, COVID)  RVPGX2 - Abnormal; Notable for the following components:      Result Value   Influenza A by PCR POSITIVE (*)    All other components within normal limits  LACTIC ACID, PLASMA - Abnormal; Notable for the following components:   Lactic Acid, Venous 3.3 (*)    All other components within normal limits  COMPREHENSIVE METABOLIC PANEL - Abnormal; Notable for the following components:   Sodium 134 (*)    Glucose, Bld 361 (*)    Creatinine, Ser 1.33 (*)    Calcium 7.7 (*)    Total Protein 5.9 (*)    Albumin 2.6 (*)    Total Bilirubin 1.5 (*)    GFR, Estimated 43 (*)    All other components within normal limits  CBC WITH DIFFERENTIAL/PLATELET - Abnormal; Notable for the following components:   WBC 12.5 (*)    RBC 3.72 (*)    Hemoglobin 11.1 (*)    HCT 35.8 (*)    Neutro Abs 11.4 (*)    Lymphs Abs 0.3 (*)    Abs Immature Granulocytes 0.11 (*)    All other components within normal limits  URINALYSIS, ROUTINE W REFLEX MICROSCOPIC - Abnormal; Notable for the following components:   APPearance CLOUDY (*)    Glucose, UA 150 (*)    Hgb urine dipstick LARGE (*)    Ketones, ur 5 (*)    Protein, ur >=300 (*)    Nitrite POSITIVE (*)    Leukocytes,Ua LARGE (*)    RBC / HPF >50 (*)    WBC, UA >50 (*)    Bacteria, UA MANY (*)    All other components within normal limits  CK - Abnormal; Notable for the following components:   Total CK 1,000 (*)    All other components within normal limits  I-STAT CHEM 8, ED - Abnormal; Notable for the following components:    Sodium 134 (*)    Creatinine, Ser 1.20 (*)    Glucose, Bld 347 (*)    Calcium, Ion 0.92 (*)    All other components within normal limits  I-STAT VENOUS BLOOD GAS, ED - Abnormal; Notable for the following components:   Sodium 134 (*)    Calcium, Ion 0.96 (*)    HCT 35.0 (*)    Hemoglobin 11.9 (*)    All other components within normal limits  TROPONIN I (HIGH SENSITIVITY) - Abnormal; Notable for the following components:   Troponin I (High Sensitivity) 29 (*)    All other components within normal limits  CULTURE, BLOOD (ROUTINE X 2)  CULTURE, BLOOD (ROUTINE X 2)  URINE CULTURE  PROTIME-INR  APTT  LACTIC ACID, PLASMA  BRAIN NATRIURETIC PEPTIDE  CBG MONITORING, ED  TROPONIN I (HIGH SENSITIVITY)  EKG  EKG Interpretation  Date/Time:  Sunday June 15 2022 00:44:22 EST Ventricular Rate:  64 PR Interval:  177 QRS Duration: 170 QT Interval:  580 QTC Calculation: 599 R Axis:   -26 Text Interpretation: Sinus rhythm Left bundle branch block Confirmed by Addison Lank [phone]) on 06/15/2022 3:18:39 AM       Radiology CT Head Wo Contrast  Result Date: 06/15/2022 CLINICAL DATA:  Head trauma, minor (Age >= 65y) EXAM: CT HEAD WITHOUT CONTRAST TECHNIQUE: Contiguous axial images were obtained from the base of the skull through the vertex without intravenous contrast. RADIATION DOSE REDUCTION: This exam was performed according to the departmental dose-optimization program which includes automated exposure control, adjustment of the mA and/or kV according to patient size and/or use of iterative reconstruction technique. COMPARISON:  None Available. FINDINGS: Brain: No evidence of large-territorial acute infarction. No parenchymal hemorrhage. No mass lesion. No extra-axial collection. No mass effect or midline shift. No hydrocephalus. Basilar cisterns are patent. Vascular: No  hyperdense vessel. Skull: No acute fracture or focal lesion. Sinuses/Orbits: Paranasal sinuses and mastoid air cells are clear. Left lens replacement. Otherwise the orbits are unremarkable. Other: None. IMPRESSION: No acute intracranial abnormality. Electronically Signed   By: Iven Finn M.D.   On: 06/15/2022 02:28   DG Chest Port 1 View  Result Date: 06/15/2022 CLINICAL DATA:  Questionable sepsis. EXAM: PORTABLE CHEST 1 VIEW COMPARISON:  AP Lat 07/04/2016. FINDINGS: The heart is moderately enlarged. There is mild central vascular prominence without overt edema. The lungs are clear of infiltrates. There mild chronic interstitial changes. The mediastinum is normally outlined. There is calcification in the aortic arch. Degenerative changes and slight dextroscoliosis thoracic spine. IMPRESSION: 1. Cardiomegaly with mild central vascular prominence but no overt edema. 2. No other evidence of acute chest disease.  Chronic change. Electronically Signed   By: Telford Nab M.D.   On: 06/15/2022 01:38   DG Pelvis Portable  Result Date: 06/15/2022 CLINICAL DATA:  Fall EXAM: PORTABLE PELVIS 1-2 VIEWS COMPARISON:  04/09/2010 FINDINGS: Symmetric degenerative changes in the hips. No acute bony abnormality. Specifically, no fracture, subluxation, or dislocation. IMPRESSION: No acute bony abnormality. Electronically Signed   By: Rolm Baptise M.D.   On: 06/15/2022 01:35    Medications Ordered in ED Medications  0.9 %  sodium chloride infusion ( Intravenous New Bag/Given 06/15/22 0132)  lactated ringers infusion ( Intravenous New Bag/Given 06/15/22 0221)  cefTRIAXone (ROCEPHIN) 2 g in sodium chloride 0.9 % 100 mL IVPB (0 g Intravenous Stopped 06/15/22 0305)  sodium chloride 0.9 % bolus 1,000 mL (1,000 mLs Intravenous New Bag/Given 06/15/22 0223)  acetaminophen (TYLENOL) tablet 1,000 mg (1,000 mg Oral Given 06/15/22 0228)                                                                                                                                      Procedures .1-3 Lead EKG Interpretation  Performed by:  Calum Cormier, Grayce Sessions, MD Authorized by: Fatima Blank, MD     Interpretation: normal     ECG rate:  61   ECG rate assessment: normal     Rhythm: sinus rhythm     Ectopy: none     Conduction: normal   .Critical Care  Performed by: Fatima Blank, MD Authorized by: Fatima Blank, MD   Critical care provider statement:    Critical care time (minutes):  55   Critical care time was exclusive of:  Separately billable procedures and treating other patients   Critical care was necessary to treat or prevent imminent or life-threatening deterioration of the following conditions:  Sepsis and respiratory failure   Critical care was time spent personally by me on the following activities:  Development of treatment plan with patient or surrogate, discussions with consultants, evaluation of patient's response to treatment, examination of patient, obtaining history from patient or surrogate, review of old charts, re-evaluation of patient's condition, pulse oximetry, ordering and review of radiographic studies, ordering and review of laboratory studies and ordering and performing treatments and interventions   Care discussed with: admitting provider     (including critical care time)  Medical Decision Making / ED Course   Medical Decision Making Amount and/or Complexity of Data Reviewed Labs: ordered. Decision-making details documented in ED Course. Radiology: ordered and independent interpretation performed. Decision-making details documented in ED Course. ECG/medicine tests: ordered and independent interpretation performed. Decision-making details documented in ED Course.  Risk OTC drugs. Prescription drug management. Decision regarding hospitalization.    Patient presents after being found down.  Complaining of generalized fatigue and having influenza.  She has strong  smell of foul urine.  Possible fall.  Differential includes but not limited to sepsis, dehydration, severe anemia, electrolyte/metabolic derangements including DKA.  Given possible fall, will also assess for any injuries.  Given the uncertainty of downtime, will also assess for rhabdo.  CBC with leukocytosis.  Mild anemia. Metabolic panel without significant electrolyte derangements.  She does have hyperglycemia without evidence of DKA.  There is mild AKI. CK elevated at 1000. VBG without acidosis. UA consistent with a urinary tract infection Influenza A positive Lactic acid elevated at 3.3  Code sepsis was activated and patient was started on empiric antibiotics.  Provided with IV fluids.  EKG with new left bundle branch block. Initial troponin slightly elevated likely demand.  Will need to trend.  Patient is requiring 4 L nasal cannula for hypoxia.  Chest x-ray without evidence of pneumonia, pneumothorax, pulmonary edema or pleural effusions.  Likely secondary to influenza.  CT head and pelvis x-ray negative for any acute injuries.  Will discuss case with hospitalist regarding admission for further management.      Final Clinical Impression(s) / ED Diagnoses Final diagnoses:  Sepsis, due to unspecified organism, unspecified whether acute organ dysfunction present [facility])  Acute UTI  AKI (acute kidney injury) ([place])  Hypoxia  Influenza           This chart was dictated using voice recognition software.  Despite best efforts to proofread,  errors can occur which can change the documentation meaning.    Fatima Blank, MD 06/15/22 [phone]

## 2022-06-15 NOTE — ED Triage Notes (Signed)
BIB [facility] from [facility]. Patient slid out of chair and called for assistance. Per SNF staff patient was unresponsive on the floor. Upon EMS arrival patient was responsive. Patient is GCS 15 upon arrival to ED.  EMS VS: 90/palp  50 HR 88%RA, 96% 2LNC 18G L AC, given '4mg'$  zofran and 500cc NS given en route

## 2022-06-15 NOTE — Evaluation (Signed)
Occupational Therapy Evaluation Patient Details Name: Jody Simpson MRN: [id number] DOB: [birth year] Today's Date: 06/15/2022   History of Present Illness Jody Simpson is a 72 y.o. female with medical history significant for SVT, essential hypertension, hyperlipidemia, GERD, type 2 diabetes, who presented to [facility] ED from independent living facility via EMS due to generalized weakness. She slid out of her chair. Admitted for sepsis secondary to UTI and + flu.   Clinical Impression   Jody Simpson is a 72 year old woman who presents to hospital with generalized weakness and decreased activity tolerance. On evaluation she was min assist to transfer to edge of bed and min assist to stand. She was able to take a couple of steps to the recliner. She is needing increased assistance for LB ADLs. Patient is independent at rollator level at home. Patient will benefit from skilled OT services while in hospital to improve deficits and learn compensatory strategies as needed in order to return to PLOF.  Patient wants to return home at discharge. Will tentatively recommend HH at this time.       Recommendations for follow up therapy are one component of a multi-disciplinary discharge planning process, led by the attending physician.  Recommendations may be updated based on patient status, additional functional criteria and insurance authorization.   Follow Up Recommendations  Home health OT     Assistance Recommended at Discharge Intermittent Supervision/Assistance  Patient can return home with the following A little help with walking and/or transfers;A little help with bathing/dressing/bathroom;Assistance with cooking/housework    Functional Status Assessment  Patient has had a recent decline in their functional status and demonstrates the ability to make significant improvements in function in a reasonable and predictable amount of time.  Equipment Recommendations  None recommended by OT     Recommendations for Other Services       Precautions / Restrictions Precautions Precautions: Fall Restrictions Weight Bearing Restrictions: No      Mobility Bed Mobility Overal bed mobility: Needs Assistance Bed Mobility: Supine to Sit     Supine to sit: Min assist     General bed mobility comments: Increased time to move legs to side of bed and then min assist for hand hold to transfer to edge of bed    Transfers Overall transfer level: Needs assistance Equipment used: Rolling walker (2 wheels) Transfers: Sit to/from Stand, Bed to chair/wheelchair/BSC Sit to Stand: Min assist, From elevated surface     Step pivot transfers: Min guard     General transfer comment: Increased time and rocking required to stand from elevated bed height. once in standing min guard to take steps to recliner.      Balance Overall balance assessment: Needs assistance Sitting-balance support: No upper extremity supported, Feet supported Sitting balance-[name]: Good     Standing balance support: During functional activity, Reliant on assistive device for balance Standing balance-[name]: Poor                             ADL either performed or assessed with clinical judgement   ADL Overall ADL's : Needs assistance/impaired Eating/Feeding: Independent   Grooming: Set up;Sitting   Upper Body Bathing: Set up;Sitting   Lower Body Bathing: Sit to/from stand;Maximal assistance   Upper Body Dressing : Set up;Sitting   Lower Body Dressing: Maximal assistance;Sit to/from stand   Toilet Transfer: Minimal assistance;BSC/3in1;Rolling walker (2 wheels)   Toileting- Clothing Manipulation and Hygiene: Maximal  assistance;Sit to/from stand       Functional mobility during ADLs: Minimal assistance;Rolling walker (2 wheels)       Vision Patient Visual Report: No change from baseline       Perception     Praxis      Pertinent Vitals/Pain Pain Assessment Pain  Assessment: No/denies pain     Hand Dominance Right   Extremity/Trunk Assessment Upper Extremity Assessment Upper Extremity Assessment: Overall WFL for tasks assessed   Lower Extremity Assessment Lower Extremity Assessment: Defer to PT evaluation   Cervical / Trunk Assessment Cervical / Trunk Assessment: Normal   Communication Communication Communication: No difficulties   Cognition Arousal/Alertness: Awake/alert Behavior During Therapy: WFL for tasks assessed/performed Overall Cognitive Status: Within Functional Limits for tasks assessed                                       General Comments       Exercises     Shoulder Instructions      Home Living Family/patient expects to be discharged to:: Other (Comment) (Independent Living) Living Arrangements: Alone           Home Layout: One level     Bathroom Shower/Tub: Teacher, early years/pre: Handicapped height     Home Equipment: Rollator (4 wheels);Tub bench;Adaptive equipment Adaptive Equipment: Reacher Additional Comments: lift chair      Prior Functioning/Environment Prior Level of Function : Independent/Modified Independent                        OT Problem List: Decreased strength;Decreased activity tolerance;Cardiopulmonary status limiting activity;Obesity      OT Treatment/Interventions: Self-care/ADL training;Therapeutic exercise;DME and/or AE instruction;Therapeutic activities;Balance training;Patient/family education    OT Goals(Current goals can be found in the care plan section) Acute Rehab OT Goals Patient Stated Goal: get to bathroom OT Goal Formulation: With patient Time For Goal Achievement: 06/29/22 Potential to Achieve Goals: Good  OT Frequency: Min 2X/week    Co-evaluation              AM-PAC OT "6 Clicks" Daily Activity     Outcome Measure Help from another person eating meals?: None Help from another person taking care of personal  grooming?: A Little Help from another person toileting, which includes using toliet, bedpan, or urinal?: A Lot Help from another person bathing (including washing, rinsing, drying)?: A Lot Help from another person to put on and taking off regular upper body clothing?: A Little Help from another person to put on and taking off regular lower body clothing?: A Lot 6 Click Score: 16   End of Session Equipment Utilized During Treatment: Oxygen;Rolling walker (2 wheels) Nurse Communication: Mobility status  Activity Tolerance: Patient tolerated treatment well Patient left: in chair;with call bell/phone within reach;with chair alarm set  OT Visit Diagnosis: Muscle weakness (generalized) (M62.81)                Time: 3086-5784 OT Time Calculation (min): 23 min Charges:  OT General Charges $OT Visit: 1 Visit OT Evaluation $OT Eval Low Complexity: 1 Low  Gustavo Lah, OTR/L [place]  Office [phone]   Lenward Chancellor 06/15/2022, 4:01 PM

## 2022-06-15 NOTE — Progress Notes (Signed)
Patient unable to get up from chair with 3 person assist. Steady used to get back to bed.

## 2022-06-15 NOTE — Progress Notes (Signed)
Progress Note   Patient: Jody Simpson OYD:[id number] DOB: [birth year] DOA: 06/15/2022     0 DOS: the patient was seen and examined on 06/15/2022   Brief hospital course: 72 y.o. female with medical history significant for SVT, essential hypertension, hyperlipidemia, GERD, type 2 diabetes, who presented to [facility] ED from independent living facility via EMS due to generalized weakness.  Endorses feeling so weak that she slid out of her chair and ended up on the floor.  States she was diagnosed with the flu on Friday.  At the facility, they could not get her up per their policy.  Reportedly, per ILF staff, patient was unresponsive on the floor.  EMS was called.  Upon EMS assessment vital signs notable for soft BPs and bradycardia with heart rate of 50.   In the ED, febrile with Tmax 101.2.  influenza A positive.  UA positive for pyuria.  Due to concern for sepsis with leukocytosis, urine culture and blood cultures were obtained.  She was started on Rocephin empirically.  Tamiflu was added.  Assessment and Plan: Sepsis secondary to UTI, POA Fever with Tmax 101.2, leukocytosis 12.5. Follow urine culture, peripheral blood cultures x 2 Continue Rocephin empirically Recheck bmet in AM   Generalized weakness, suspect multifactorial Treat underlying conditions PT OT consulted, thus far, recs for [facility] Fall precautions   Elevated troponin with LBBB, unclear if new Denies any anginal symptoms -Trop peaked to just over 100 Cardiology was consulted, suspecting demand ischemia 2d echo pending Monitor on telemetry   Influenza A viral infection Tamiflu 75 mg twice daily x 5 days DuoNebs every 6 hours   Acute hypoxic respiratory failure secondary to influenza A viral infection O2 saturation in the 80s at the independent living facility Requiring 2 L to maintain O2 saturation greater than 92%. Incentive spirometer, bronchodilators.   AKI, suspect prerenal in the setting of dehydration Baseline  creatinine 1.0 Presented with creatinine 1.3.  GFR 43. Received adequate IV fluid hydration Monitor urine output Avoid nephrotoxic agents and hypotension. Creatinine down trending 1.20.   Type 2 diabetes with hyperglycemia Serum glucose 347 A1c 8.2 Cont SSI as needed   Possible syncope Reportedly, per ILF staff patient was unresponsive on the floor. Obtain orthostatic vital signs Follow 2D echo, pending Closely monitor on telemetry Fall precautions   Cardiomegaly, elevated BNP BNP 677, trop peaked to 100 Hold off IV fluid 2d echo pending   Elevated CPK CPK 1000 Received adequate IV fluid Holding off additional IV fluid for now   Isolated hyperbilirubinemia AST ALT and alkaline phosphatase normal. T. bili 1.5 Monitor for now   Hypomagnesemia -replaced     Subjective: Reports feeling better this AM  Physical Exam: Vitals:   06/15/22 0745 06/15/22 1101 06/15/22 1401 06/15/22 1512  BP: (!) 157/54 126/87 135/83   Pulse: 69 67 60   Resp: (!) '24 18 16   '$ Temp:  98.5 F (36.9 C) 97.9 F (36.6 C)   TempSrc:  Oral Oral   SpO2: 94% 96% 96% 95%  Weight:      Height:       General exam: Awake, laying in bed, in nad Respiratory system: Normal respiratory effort, no wheezing Cardiovascular system: regular rate, s1, s2 Gastrointestinal system: Soft, nondistended, positive BS Central nervous system: CN2-12 grossly intact, strength intact Extremities: Perfused, no clubbing Skin: Normal skin turgor, no notable skin lesions seen Psychiatry: Mood normal // no visual hallucinations   Data Reviewed:  Labs reviewed: Na 134, K 4.8, Cr 1.20,  Mg 1.6  Family Communication: Pt in room, family not at bedside  Disposition: Status is: Inpatient Remains inpatient appropriate because: Severity of illness  Planned Discharge Destination: Home     Author: Marylu Lund, MD 06/15/2022 4:04 PM  For on call review [email or web address].

## 2022-06-15 NOTE — ED Notes (Signed)
ED TO INPATIENT HANDOFF REPORT  ED Nurse Name and Phone #: amber  S Name/Age/Gender Jody Simpson 72 y.o. female Room/Bed: 005C/005C  Code Status   Code Status: Full Code  Home/SNF/Other Skilled nursing facility Patient oriented to: self, place, time, and situation Is this baseline? Yes   Triage Complete: Triage complete  Chief Complaint Sepsis secondary to UTI ([place]) [A41.9, N39.0]  Triage Note BIB [facility] from [facility]. Patient slid out of chair and called for assistance. Per SNF staff patient was unresponsive on the floor. Upon EMS arrival patient was responsive. Patient is GCS 15 upon arrival to ED.  EMS VS: 90/palp  50 HR 88%RA, 96% 2LNC 18G L AC, given '4mg'$  zofran and 500cc NS given en route   Allergies Allergies  Allergen Reactions   Aspirin     REACTION: stomach irritation   Banana    Cholestyramine     REACTION: Diarrhea and nausea   Codeine     REACTION: stomach irritant   Diazepam     REACTION: hyper   Hydrocodone    Ibuprofen Other (See Comments)   Iodine    Latex    Meperidine Hcl    Metformin     REACTION: Diarrhea   Naproxen Sodium     REACTION: stomach irritant   Penicillins    Pravastatin Sodium     REACTION: Flu like symptoms   Sitagliptin Phosphate     REACTION: Caused elevated CBGs    Level of Care/Admitting Diagnosis ED Disposition     ED Disposition  Admit   Condition  --   Comment  Hospital Area: [facility] [[id number]]  Level of Care: Telemetry Medical [[id number]]  May [facility] patient to [facility] or [facility] if equivalent level of care is available:: Yes  Covid Evaluation: Asymptomatic - no recent exposure (last 10 days) testing not required  Diagnosis: Sepsis secondary to UTI [facility]) [[id number]]  Admitting Physician: Kayleen Memos [[id number]]  Attending Physician: Kayleen Memos [[id number]]  Certification:: I certify this patient will need inpatient services for at least 2 midnights   Estimated Length of Stay: 2          B Medical/Surgery History Past Medical History:  Diagnosis Date   Asthma    as child   CAP (community acquired pneumonia)    Diabetes mellitus without complication ([place])    Fall at home    Down x48 hours   GERD (gastroesophageal reflux disease)    Gout    Hyperkalemia    Mild   Hyperlipidemia    Hypertension    IBS (irritable bowel syndrome)    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)    Morbid obesity ([place])    Neuropathy    Non-traumatic rhabdomyolysis    PAT (paroxysmal atrial tachycardia)    Sinus arrhythmia    Past Surgical History:  Procedure Laterality Date   [place] [facility]   kideny removal Left 1997   Renal Carcinoma cell   PARATHYROIDECTOMY  1998     A IV Location/Drains/Wounds Patient Lines/Drains/Airways Status     Active Line/Drains/Airways     Name Placement date Placement time Site Days   Peripheral IV 06/15/22 18 G Left Antecubital 06/15/22  0043  Antecubital  less than 1   Peripheral IV 06/15/22 20 G Anterior;Distal;Right;Upper Arm 06/15/22  0127  Arm  less than 1   External Urinary Catheter  06/15/22  0131  --  less than 1            Intake/Output Last 24 hours No intake or output data in the 24 hours ending 06/15/22 1203  Labs/Imaging Results for orders placed or performed during the hospital encounter of 06/15/22 (from the past 48 hour(s))  Lactic acid, plasma     Status: Abnormal   Collection Time: 06/15/22 12:54 AM  Result Value Ref Range   Lactic Acid, Venous 3.3 (HH) 0.5 - 1.9 mmol/L    Comment: CRITICAL RESULT CALLED TO, READ BACK BY AND VERIFIED WITH LUECKE,A RN 06/15/22 0207 AMIREHSANI,F Performed at [facility], [place] [address], [place], [place] [address]   Comprehensive metabolic panel     Status: Abnormal   Collection Time: 06/15/22 12:55 AM  Result Value Ref Range   Sodium 134 (L) 135 - 145 mmol/L    Potassium 4.7 3.5 - 5.1 mmol/L    Comment: HEMOLYSIS AT THIS LEVEL MAY AFFECT RESULT   Chloride 98 98 - 111 mmol/L   CO2 22 22 - 32 mmol/L   Glucose, Bld 361 (H) 70 - 99 mg/dL    Comment: Glucose reference range applies only to samples taken after fasting for at least 8 hours.   BUN 15 8 - 23 mg/dL   Creatinine, Ser 1.33 (H) 0.44 - 1.00 mg/dL   Calcium 7.7 (L) 8.9 - 10.3 mg/dL   Total Protein 5.9 (L) 6.5 - 8.1 g/dL   Albumin 2.6 (L) 3.5 - 5.0 g/dL   AST 28 15 - 41 U/L    Comment: HEMOLYSIS AT THIS LEVEL MAY AFFECT RESULT   ALT 10 0 - 44 U/L    Comment: HEMOLYSIS AT THIS LEVEL MAY AFFECT RESULT   Alkaline Phosphatase 92 38 - 126 U/L   Total Bilirubin 1.5 (H) 0.3 - 1.2 mg/dL    Comment: HEMOLYSIS AT THIS LEVEL MAY AFFECT RESULT   GFR, Estimated 43 (L) >60 mL/min    Comment: (NOTE) Calculated using the CKD-EPI Creatinine Equation (2021)    Anion gap 14 5 - 15    Comment: Performed at [facility], [place] [address] [place], [place] [address]  CBC with Differential     Status: Abnormal   Collection Time: 06/15/22 12:55 AM  Result Value Ref Range   WBC 12.5 (H) 4.0 - 10.5 K/uL   RBC 3.72 (L) 3.87 - 5.11 MIL/uL   Hemoglobin 11.1 (L) 12.0 - 15.0 g/dL   HCT 35.8 (L) 36.0 - 46.0 %   MCV 96.2 80.0 - 100.0 fL   MCH 29.8 26.0 - 34.0 pg   MCHC 31.0 30.0 - 36.0 g/dL   RDW 14.6 11.5 - 15.5 %   Platelets 213 150 - 400 K/uL   nRBC 0.0 0.0 - 0.2 %   Neutrophils Relative % 92 %   Neutro Abs 11.4 (H) 1.7 - 7.7 K/uL   Lymphocytes Relative 2 %   Lymphs Abs 0.3 (L) 0.7 - 4.0 K/uL   Monocytes Relative 5 %   Monocytes Absolute 0.6 0.1 - 1.0 K/uL   Eosinophils Relative 0 %   Eosinophils Absolute 0.0 0.0 - 0.5 K/uL   Basophils Relative 0 %   Basophils Absolute 0.1 0.0 - 0.1 K/uL   Immature Granulocytes 1 %   Abs Immature Granulocytes 0.11 (H) 0.00 - 0.07 K/uL    Comment: Performed at [facility] [address], [place], [place] [address]  Protime-INR  Status: None   Collection  Time: 06/15/22 12:55 AM  Result Value Ref Range   Prothrombin Time 13.9 11.4 - 15.2 seconds   INR 1.1 0.8 - 1.2    Comment: (NOTE) INR goal varies based on device and disease states. Performed at [facility], [place] [address] [place], [place] [address]   APTT     Status: None   Collection Time: 06/15/22 12:55 AM  Result Value Ref Range   aPTT 28 24 - 36 seconds    Comment: Performed at [facility] [address], [place], [place] [address]  Troponin I (High Sensitivity)     Status: Abnormal   Collection Time: 06/15/22 12:55 AM  Result Value Ref Range   Troponin I (High Sensitivity) 29 (H) <18 ng/L    Comment: (NOTE) Elevated high sensitivity troponin I (hsTnI) values and significant  changes across serial measurements may suggest ACS but many other  chronic and acute conditions are known to elevate hsTnI results.  Refer to the "Links" section for chest pain algorithms and additional  guidance. Performed at [facility], [place] [address], [place], [place] [address]   CK     Status: Abnormal   Collection Time: 06/15/22 12:55 AM  Result Value Ref Range   Total CK 1,000 (H) 38 - 234 U/L    Comment: HEMOLYSIS AT THIS LEVEL MAY AFFECT RESULT Performed at [facility], [place] [address] [place], [place] [address]   Brain natriuretic peptide     Status: Abnormal   Collection Time: 06/15/22 12:55 AM  Result Value Ref Range   B Natriuretic Peptide 677.5 (H) 0.0 - 100.0 pg/mL    Comment: Performed at [facility] [address], [place],  [address]  I-Stat Chem 8, ED     Status: Abnormal   Collection Time: 06/15/22  1:15 AM  Result Value Ref Range   Sodium 134 (L) 135 - 145 mmol/L   Potassium 4.8 3.5 - 5.1 mmol/L   Chloride 99 98 - 111 mmol/L   BUN 19 8 - 23 mg/dL   Creatinine, Ser 1.20 (H) 0.44 - 1.00 mg/dL   Glucose, Bld 347 (H) 70 - 99 mg/dL    Comment: Glucose reference range applies only to samples taken after fasting for at least 8 hours.    Calcium, Ion 0.92 (L) 1.15 - 1.40 mmol/L   TCO2 24 22 - 32 mmol/L   Hemoglobin 12.2 12.0 - 15.0 g/dL   HCT 36.0 36.0 - 46.0 %  I-Stat venous blood gas, ED ([facility])     Status: Abnormal   Collection Time: 06/15/22  1:15 AM  Result Value Ref Range   pH, Ven 7.362 7.25 - 7.43   pCO2, Ven 45.2 44 - 60 mmHg   pO2, Ven 45 32 - 45 mmHg   Bicarbonate 25.6 20.0 - 28.0 mmol/L   TCO2 27 22 - 32 mmol/L   O2 Saturation 79 %   Acid-Base Excess 0.0 0.0 - 2.0 mmol/L   Sodium 134 (L) 135 - 145 mmol/L   Potassium 4.8 3.5 - 5.1 mmol/L   Calcium, Ion 0.96 (L) 1.15 - 1.40 mmol/L   HCT 35.0 (L) 36.0 - 46.0 %   Hemoglobin 11.9 (L) 12.0 - 15.0 g/dL   Sample type VENOUS   Urinalysis, Routine w reflex microscopic Urine, In & Out Cath     Status: Abnormal   Collection Time: 06/15/22  1:30 AM  Result Value Ref  Range   Color, Urine YELLOW YELLOW   APPearance CLOUDY (A) CLEAR   Specific Gravity, Urine 1.019 1.005 - 1.030   pH 5.0 5.0 - 8.0   Glucose, UA 150 (A) NEGATIVE mg/dL   Hgb urine dipstick LARGE (A) NEGATIVE   Bilirubin Urine NEGATIVE NEGATIVE   Ketones, ur 5 (A) NEGATIVE mg/dL   Protein, ur >=300 (A) NEGATIVE mg/dL   Nitrite POSITIVE (A) NEGATIVE   Leukocytes,Ua LARGE (A) NEGATIVE   RBC / HPF >50 (H) 0 - 5 RBC/hpf   WBC, UA >50 (H) 0 - 5 WBC/hpf   Bacteria, UA MANY (A) NONE SEEN   Squamous Epithelial / HPF 0-5 0 - 5 /HPF   WBC Clumps PRESENT     Comment: Performed at [facility], [address] [place], [place] [address]  Resp panel by RT-PCR (RSV, Flu A&B, Covid) Anterior Nasal Swab     Status: Abnormal   Collection Time: 06/15/22  1:30 AM   Specimen: Anterior Nasal Swab  Result Value Ref Range   SARS Coronavirus 2 by RT PCR NEGATIVE NEGATIVE    Comment: (NOTE) SARS-CoV-2 target nucleic acids are NOT DETECTED.  The SARS-CoV-2 RNA is generally detectable in upper respiratory specimens during the acute phase of infection. The lowest concentration of SARS-CoV-2 viral copies this  assay can detect is 138 copies/mL. A negative result does not preclude SARS-Cov-2 infection and should not be used as the sole basis for treatment or other patient management decisions. A negative result may occur with  improper specimen collection/handling, submission of specimen other than nasopharyngeal swab, presence of viral mutation(s) within the areas targeted by this assay, and inadequate number of viral copies(<138 copies/mL). A negative result must be combined with clinical observations, patient history, and epidemiological information. The expected result is Negative.  Fact Sheet for Patients:  EntrepreneurPulse.com.au  Fact Sheet for Healthcare Providers:  IncredibleEmployment.be  This test is no t yet approved or cleared by the [place] FDA and  has been authorized for detection and/or diagnosis of SARS-CoV-2 by FDA under an Emergency Use Authorization (EUA). This EUA will remain  in effect (meaning this test can be used) for the duration of the COVID-19 declaration under Section 564(b)(1) of the Act, 21 U.S.C.section 360bbb-3(b)(1), unless the authorization is terminated  or revoked sooner.       Influenza A by PCR POSITIVE (A) NEGATIVE   Influenza B by PCR NEGATIVE NEGATIVE    Comment: (NOTE) The Xpert Xpress SARS-CoV-2/FLU/RSV plus assay is intended as an aid in the diagnosis of influenza from Nasopharyngeal swab specimens and should not be used as a sole basis for treatment. Nasal washings and aspirates are unacceptable for Xpert Xpress SARS-CoV-2/FLU/RSV testing.  Fact Sheet for Patients: EntrepreneurPulse.com.au  Fact Sheet for Healthcare Providers: IncredibleEmployment.be  This test is not yet approved or cleared by the [place] FDA and has been authorized for detection and/or diagnosis of SARS-CoV-2 by FDA under an Emergency Use Authorization (EUA). This EUA will remain in  effect (meaning this test can be used) for the duration of the COVID-19 declaration under Section 564(b)(1) of the Act, 21 U.S.C. section 360bbb-3(b)(1), unless the authorization is terminated or revoked.     Resp Syncytial Virus by PCR NEGATIVE NEGATIVE    Comment: (NOTE) Fact Sheet for Patients: EntrepreneurPulse.com.au  Fact Sheet for Healthcare Providers: IncredibleEmployment.be  This test is not yet approved or cleared by the [place] FDA and has been authorized for detection and/or diagnosis of SARS-CoV-2  by FDA under an Emergency Use Authorization (EUA). This EUA will remain in effect (meaning this test can be used) for the duration of the COVID-19 declaration under Section 564(b)(1) of the Act, 21 U.S.C. section 360bbb-3(b)(1), unless the authorization is terminated or revoked.  Performed at [facility], [place] [address], [place], [place] [address]   Lactic acid, plasma     Status: None   Collection Time: 06/15/22  6:00 AM  Result Value Ref Range   Lactic Acid, Venous 1.7 0.5 - 1.9 mmol/L    Comment: Performed at [facility] [address], [place], [place] [address]  Troponin I (High Sensitivity)     Status: Abnormal   Collection Time: 06/15/22  6:00 AM  Result Value Ref Range   Troponin I (High Sensitivity) 108 (HH) <18 ng/L    Comment: CRITICAL RESULT CALLED TO, READ BACK BY AND VERIFIED WITH V. DEE RN 114/24 '@0702'$  BY J. WHITE DELTA CHECK NOTED (NOTE) Elevated high sensitivity troponin I (hsTnI) values and significant  changes across serial measurements may suggest ACS but many other  chronic and acute conditions are known to elevate hsTnI results.  Refer to the "Links" section for chest pain algorithms and additional  guidance. Performed at [facility], [place] [address] [place], [place] [address]   HIV Antibody (routine testing w rflx)     Status: None   Collection Time: 06/15/22  6:00 AM  Result  Value Ref Range   HIV Screen 4th Generation wRfx Non Reactive Non Reactive    Comment: Performed at [facility], [place] [address] [place], [place] [address]  MRSA Next Gen by PCR, Nasal     Status: None   Collection Time: 06/15/22  6:00 AM   Specimen: Nasal Mucosa; Nasal Swab  Result Value Ref Range   MRSA by PCR Next Gen NOT DETECTED NOT DETECTED    Comment: (NOTE) The GeneXpert MRSA Assay (FDA approved for NASAL specimens only), is one component of a comprehensive MRSA colonization surveillance program. It is not intended to diagnose MRSA infection nor to guide or monitor treatment for MRSA infections. Test performance is not FDA approved in patients less than 57 years old. Performed at [facility], [place] [address] [place], [place] [address]   CBC     Status: Abnormal   Collection Time: 06/15/22  6:00 AM  Result Value Ref Range   WBC 10.3 4.0 - 10.5 K/uL   RBC 3.38 (L) 3.87 - 5.11 MIL/uL   Hemoglobin 10.4 (L) 12.0 - 15.0 g/dL   HCT 31.7 (L) 36.0 - 46.0 %   MCV 93.8 80.0 - 100.0 fL   MCH 30.8 26.0 - 34.0 pg   MCHC 32.8 30.0 - 36.0 g/dL   RDW 14.5 11.5 - 15.5 %   Platelets 189 150 - 400 K/uL   nRBC 0.0 0.0 - 0.2 %    Comment: Performed at [facility], [place] [address] [place], [place] [address]  Magnesium     Status: Abnormal   Collection Time: 06/15/22  6:00 AM  Result Value Ref Range   Magnesium 1.6 (L) 1.7 - 2.4 mg/dL    Comment: Performed at [facility] [address] [place], [place] [address]  Phosphorus     Status: None   Collection Time: 06/15/22  6:00 AM  Result Value Ref Range   Phosphorus 4.6 2.5 - 4.6 mg/dL    Comment: Performed at [facility], [place] [address] [place], [place] [address]  POC CBG, ED     Status: Abnormal   Collection Time: 06/15/22  7:44 AM  Result Value Ref Range   Glucose-Capillary 267 (H) 70 - 99 mg/dL    Comment: Glucose reference range applies only to samples taken after fasting for at least 8 hours.   Troponin I (High Sensitivity)     Status: Abnormal   Collection Time: 06/15/22  9:37 AM  Result Value Ref Range   Troponin I (High Sensitivity) 134 (HH) <18 ng/L    Comment: CRITICAL VALUE NOTED. VALUE IS CONSISTENT WITH PREVIOUSLY REPORTED/CALLED VALUE (NOTE) Elevated high sensitivity troponin I (hsTnI) values and significant  changes across serial measurements may suggest ACS but many other  chronic and acute conditions are known to elevate hsTnI results.  Refer to the "Links" section for chest pain algorithms and additional  guidance. Performed at [facility], [place] [address], [place], [place] [address]    CT Head Wo Contrast  Result Date: 06/15/2022 CLINICAL DATA:  Head trauma, minor (Age >= 65y) EXAM: CT HEAD WITHOUT CONTRAST TECHNIQUE: Contiguous axial images were obtained from the base of the skull through the vertex without intravenous contrast. RADIATION DOSE REDUCTION: This exam was performed according to the departmental dose-optimization program which includes automated exposure control, adjustment of the mA and/or kV according to patient size and/or use of iterative reconstruction technique. COMPARISON:  None Available. FINDINGS: Brain: No evidence of large-territorial acute infarction. No parenchymal hemorrhage. No mass lesion. No extra-axial collection. No mass effect or midline shift. No hydrocephalus. Basilar cisterns are patent. Vascular: No hyperdense vessel. Skull: No acute fracture or focal lesion. Sinuses/Orbits: Paranasal sinuses and mastoid air cells are clear. Left lens replacement. Otherwise the orbits are unremarkable. Other: None. IMPRESSION: No acute intracranial abnormality. Electronically Signed   By: Iven Finn M.D.   On: 06/15/2022 02:28   DG Chest Port 1 View  Result Date: 06/15/2022 CLINICAL DATA:  Questionable sepsis. EXAM: PORTABLE CHEST 1 VIEW COMPARISON:  AP Lat 07/04/2016. FINDINGS: The heart is moderately enlarged. There is mild central  vascular prominence without overt edema. The lungs are clear of infiltrates. There mild chronic interstitial changes. The mediastinum is normally outlined. There is calcification in the aortic arch. Degenerative changes and slight dextroscoliosis thoracic spine. IMPRESSION: 1. Cardiomegaly with mild central vascular prominence but no overt edema. 2. No other evidence of acute chest disease.  Chronic change. Electronically Signed   By: Telford Nab M.D.   On: 06/15/2022 01:38   DG Pelvis Portable  Result Date: 06/15/2022 CLINICAL DATA:  Fall EXAM: PORTABLE PELVIS 1-2 VIEWS COMPARISON:  04/09/2010 FINDINGS: Symmetric degenerative changes in the hips. No acute bony abnormality. Specifically, no fracture, subluxation, or dislocation. IMPRESSION: No acute bony abnormality. Electronically Signed   By: Rolm Baptise M.D.   On: 06/15/2022 01:35    Pending Labs Unresulted Labs (From admission, onward)     Start     Ordered   06/22/22 0500  Creatinine, serum  (enoxaparin (LOVENOX)    CrCl >/= 30 ml/min)  Weekly,   R     Comments: while on enoxaparin therapy    06/15/22 0500   06/15/22 0501  Hemoglobin A1c  Once,   R       Comments: To assess prior glycemic control    06/15/22 0500   06/15/22 0242  Urine Culture  Once,   URGENT       Question:  Indication  Answer:  Sepsis   06/15/22 0242   06/15/22 0047  Blood Culture (routine x 2)  (Undifferentiated presentation (screening labs and basic nursing orders))  BLOOD CULTURE X 2,   STAT      06/15/22 0048            Vitals/Pain Today's Vitals   06/15/22 0600 06/15/22 0603 06/15/22 0745 06/15/22 1101  BP: (!) 156/49  (!) 157/54 126/87  Pulse: 72  69 67  Resp: (!) 27  (!) 24 18  Temp:    98.5 F (36.9 C)  TempSrc:    Oral  SpO2: 96%  94% 96%  Weight:      Height:      PainSc:  Asleep      Isolation Precautions Airborne and Contact precautions  Medications Medications  cefTRIAXone (ROCEPHIN) 2 g in sodium chloride 0.9 % 100 mL IVPB (0  g Intravenous Stopped 06/15/22 0305)  enoxaparin (LOVENOX) injection 40 mg (40 mg Subcutaneous Given 06/15/22 0911)  insulin aspart (novoLOG) injection 0-9 Units (5 Units Subcutaneous Given 06/15/22 0809)  insulin aspart (novoLOG) injection 0-5 Units (has no administration in time range)  oseltamivir (TAMIFLU) capsule 75 mg (75 mg Oral Given 06/15/22 0603)  atenolol (TENORMIN) tablet 50 mg (50 mg Oral Not Given 06/15/22 0910)  montelukast (SINGULAIR) tablet 10 mg (has no administration in time range)  famotidine (PEPCID) tablet 20 mg (20 mg Oral Given 06/15/22 0910)  albuterol (PROVENTIL) (2.5 MG/3ML) 0.083% nebulizer solution 3 mL (has no administration in time range)  ipratropium-albuterol (DUONEB) 0.5-2.5 (3) MG/3ML nebulizer solution 3 mL (3 mLs Nebulization Given 06/15/22 0922)  guaiFENesin-dextromethorphan (ROBITUSSIN DM) 100-10 MG/5ML syrup 5 mL (has no administration in time range)  sodium chloride 0.9 % bolus 1,000 mL (0 mLs Intravenous Stopped 06/15/22 0603)  acetaminophen (TYLENOL) tablet 1,000 mg (1,000 mg Oral Given 06/15/22 0228)  magnesium sulfate IVPB 2 g 50 mL (2 g Intravenous New Bag/Given 06/15/22 0913)    Mobility walks with device Moderate fall risk   Focused Assessments Neuro Assessment Handoff:  Swallow screen pass? Yes  Cardiac Rhythm: Normal sinus rhythm       Neuro Assessment:   Neuro Checks:      Has TPA been given? No If patient is a Neuro Trauma and patient is going to OR before floor call report to [place] nurse: [phone] or [phone]   R Recommendations: See Admitting Provider Note  Report given to:   Additional Notes: PI: ARLI BREE is a 72 y.o. female with medical history significant for SVT, essential hypertension, hyperlipidemia, GERD, type 2 diabetes, who presented to [facility] ED from independent living facility via EMS due to generalized weakness.  Endorses feeling so weak that she slid out of her chair and ended up on the floor.  States she  was diagnosed with the flu on Friday.  At the facility, they could not get her up per their policy.  Reportedly, per ILF staff, patient was unresponsive on the floor.  EMS was called.  Upon EMS assessment vital signs notable for soft BPs and bradycardia with heart rate of 50.   In the ED, febrile with Tmax 101.2.  influenza A positive.  UA positive for pyuria.  Due to concern for sepsis with leukocytosis, urine culture and blood cultures were obtained.  She was started on Rocephin empirically.  Tamiflu was added.   ED Course: Tmax 101.2.  BP 162/147, pulse 67, respiratory rate 27, O2 saturation 96% on 4 L.  Lab studies  significant for sodium 134, glucose 347, creatinine 1.20.  BNP 677.  CPK 1000.  Troponin 29.  WBC 12.5.  Hemoglobin 11.1.   Am trop 134

## 2022-06-15 NOTE — ED Notes (Signed)
Pt received breakfast traay, no other needs from pt at this time.

## 2022-06-15 NOTE — ED Notes (Signed)
Patient is hard stick, patient continues to bed arm after multiple reminders. IVF is still infusing over pump. Awaitng labs

## 2022-06-15 NOTE — ED Notes (Signed)
Lactic to be obtain after IVF is complete

## 2022-06-15 NOTE — Hospital Course (Signed)
72 y.o. female with medical history significant for SVT, essential hypertension, hyperlipidemia, GERD, type 2 diabetes, who presented to [facility] ED from independent living facility via EMS due to generalized weakness.  Endorses feeling so weak that she slid out of her chair and ended up on the floor.  States she was diagnosed with the flu on Friday.  At the facility, they could not get her up per their policy.  Reportedly, per ILF staff, patient was unresponsive on the floor.  EMS was called.  Upon EMS assessment vital signs notable for soft BPs and bradycardia with heart rate of 50.   In the ED, febrile with Tmax 101.2.  influenza A positive.  UA positive for pyuria.  Due to concern for sepsis with leukocytosis, urine culture and blood cultures were obtained.  She was started on Rocephin empirically.  Tamiflu was added.

## 2022-06-15 NOTE — Consult Note (Signed)
Cardiology Consultation   Patient ID: Jody Simpson MRN: [id number]; DOB: 02/09/1951  Admit date: 06/15/2022 Date of Consult: 06/15/2022  PCP:  Jody Ruff, MD (Inactive)   [facility] Providers Cardiologist: New   Patient Profile:   Jody Simpson is a 72 y.o. female with a hx of morbid obesity, type 2 diabetes, hypertension, hyperlipidemia, paroxysmal atrial tachycardia, who is being seen 06/15/2022 for the evaluation of left bundle branch block at the request of Dr. Nevada Crane.  History of Present Illness:   Jody Simpson with above past medical history presented to the ER at midnight today for further evaluation of loss of consciousness.  Reportedly she was found down and unresponsive by independent living facility staff.  Patient had regained consciousness upon EMS arrival.  Patient was noted with systolic blood pressure 57Q, heart rate 50s, and pulse ox 80s on room air.  She has required nonrebreather and IV fluid support.  She has reported feeling sick from the flu for few days, complaints generalized fatigue and weakness, poor oral intake. She slipped out of her chair and fell on the ground.   Per chart review, she was hospitalized 07/2016 for pneumonia, rhabdomyolysis, fall at home. She was seen as a inpatient consultation by Dr. Percival Spanish 07/03/2016 for runs of paroxysmal atrial tachycardia, there was questionable of paroxysmal atrial fibrillation and a few pauses on telemetry, she was placed on atenolol with subsequently resolved episodes, anticoagulation was not recommended given her social circumstance and low suspicion for A-fib.  She does not follow-up with cardiology at regular basis.  Admission diagnostic revealed hyponatremia 134, elevated creatinine 1.33 and GFR 43, elevated glucose 361.  BNP 677.  CK 1000.  High sensitive troponin 29 >108.  Lactic acid 3.3 >1.7.  Phosphorus 4.6.  Magnesium 1.6.  CBC diff with leukocytosis 12 500, hemoglobin 11.1.  INR 1.1.   Influenza A positive. UA + glucose, hemoglobin, ketones, leukocyte, nitrate, protein, bacteria, RBC.  Blood culture x 2 and urine culture sent. CT head showed no acute findings.  Chest x-ray revealed mild pulmonary edema.  Pelvis x-ray without acute fracture.EKG revealed sinus rhythm with ventricular rate of 64bpm, LBBB new comparing to EKG from 2018. She was admitted for , sepsis 2/2 UTI, influenza a and AKI. Patient was started on antibiotic and IVF, admitted to hospitalist service. Cardiology is consulted for elevated trop and LBBB today.    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 Authorizing Provider  acetaminophen (TYLENOL) 325 MG tablet Take 650 mg by mouth every 4 (  four) hours as needed for mild pain.    [provider]  albuterol (VENTOLIN HFA) 108 (90 Base) MCG/ACT inhaler Ventolin HFA 90 mcg/actuation aerosol inhaler    [provider]  allopurinol (ZYLOPRIM) 300 MG tablet TK 1 T PO QD 04/11/16   [provider]  atenolol (TENORMIN) 50 MG tablet Take 50 mg by mouth 2 (two) times daily.  06/17/16   [provider]  baclofen (LIORESAL) 10 MG tablet baclofen 10 mg tablet  Take 1  tablet 4 times a day by oral route.    [provider]  famotidine (PEPCID) 20 MG tablet Take 20 mg by mouth 2 (two) times daily.    [provider]  fluticasone (FLONASE) 50 MCG/ACT nasal spray fluticasone propionate 50 mcg/actuation nasal spray,suspension  U 2 SPRAYS NASALLY PRN .    [provider]  glipiZIDE (GLUCOTROL) 10 MG tablet TK 1 T PO BID 04/14/16   [provider]  Insulin Aspart FlexPen 100 UNIT/ML SOPN SMARTSIG:20 Unit(s) SUB-Q 3 Times Daily 06/16/19   [provider]  lisinopril-hydrochlorothiazide (PRINZIDE,ZESTORETIC) 20-25 MG tablet TK 1 T PO QD 06/17/16   [provider]  loratadine (CLARITIN) 10 MG tablet Take 10 mg by mouth daily.    [provider]  montelukast (SINGULAIR) 10 MG tablet Take 10 mg by mouth at bedtime.    [provider]    Inpatient Medications: Scheduled Meds:  atenolol  50 mg Oral BID   enoxaparin (LOVENOX) injection  40 mg Subcutaneous Q24H   famotidine  20 mg Oral BID   insulin aspart  0-5 Units Subcutaneous QHS   insulin aspart  0-9 Units Subcutaneous TID WC   ipratropium-albuterol  3 mL Nebulization Q6H   montelukast  10 mg Oral QHS   oseltamivir  75 mg Oral BID   Continuous Infusions:  cefTRIAXone (ROCEPHIN)  IV Stopped (06/15/22 0305)   magnesium sulfate bolus IVPB     PRN Meds: albuterol  Allergies:    Allergies  Allergen Reactions   Aspirin     REACTION: stomach irritation   Banana    Cholestyramine     REACTION: Diarrhea and nausea   Codeine     REACTION: stomach irritant   Diazepam     REACTION: hyper   Hydrocodone    Ibuprofen Other (See Comments)   Iodine    Latex    Meperidine Hcl    Metformin     REACTION: Diarrhea   Naproxen Sodium     REACTION: stomach irritant   Penicillins    Pravastatin Sodium     REACTION: Flu like symptoms   Sitagliptin Phosphate     REACTION: Caused elevated CBGs    Social History:   Social History    Socioeconomic History   Marital status: Single    Spouse name: Not on file   Number of children: Not on file   Years of education: Not on file   Highest education level: Not on file  Occupational History   Not on file  Tobacco Use   Smoking status: Never   Smokeless tobacco: Never  Vaping Use   Vaping Use: Never used  Substance and Sexual Activity   Alcohol use: No   Drug use: No   Sexual activity: Never  Other Topics Concern   Not on file  Social History Narrative   Not on file   Social Determinants of Health   Financial Resource Strain: Not on file  Food Insecurity: Not on  file  Transportation Needs: Not on file  Physical Activity: Not on file  Stress: Not on file  Social Connections: Not on file  Intimate Partner Violence: Not on file    Family History:    Family History  Problem Relation Age of Onset   CAD Father        hx of CABG   Heart disease Father    Diabetes Brother    Breast cancer Mother      ROS:  Constitutional: see HPI  Eyes: Denied vision change or loss Ears/Nose/Mouth/Throat: Denied ear ache, sore throat, coughing, sinus pain Cardiovascular: see HPI  Respiratory:see HPI  Gastrointestinal: Denied nausea, vomiting, abdominal pain, diarrhea Genital/Urinary: Denied dysuria, hematuria, urinary frequency/urgency Musculoskeletal: Denied muscle ache, joint pain, weakness Skin: Denied rash, wound Neuro: Denied headache, dizziness, syncope Psych: Denied history of depression/anxiety  Endocrine: history of diabetes   Physical Exam/Data:   Vitals:   06/15/22 0445 06/15/22 0500 06/15/22 0600 06/15/22 0745  BP:   (!) 156/49 (!) 157/54  Pulse:   72 69  Resp:   (!) 27 (!) 24  Temp: 98.5 F (36.9 C)     TempSrc: Oral     SpO2:   96% 94%  Weight:      Height:  '5\' 7"'$  (1.702 m)     No intake or output data in the 24 hours ending 06/15/22 0904    06/15/2022   12:47 AM 07/25/2016   10:20 AM 07/23/2016    1:07 PM  Last 3 Weights  Weight (lbs)  300 lb 319 lb 297 lb  Weight (kg) 136.079 kg 144.697 kg 134.718 kg     Body mass index is 46.99 kg/m.   General appearance: alert, no distress, morbidly obese, and pale Neck: no carotid bruit, no JVD, and thyroid not enlarged, symmetric, no tenderness/mass/nodules Lungs: diminished breath sounds bilaterally and wheezes bilaterally Heart: regular rate and rhythm, S1, S2 normal, no murmur, click, rub or gallop Abdomen: soft, non-tender; bowel sounds normal; no masses,  no organomegaly Extremities: extremities normal, atraumatic, no cyanosis or edema Pulses: 2+ and symmetric Skin: Skin color, texture, turgor normal. No rashes or lesions Neurologic: Grossly normal Psych: Pleasant   EKG:  The EKG was personally reviewed and demonstrates: Sinus rhythm at 64, left bundle branch block  Telemetry:  Telemetry was personally reviewed and demonstrates: Sinus rhythm  Relevant CV Studies:  Echo is pending   Laboratory Data:  High Sensitivity Troponin:   Recent Labs  Lab 06/15/22 0055 06/15/22 0600  TROPONINIHS 29* 108*     Chemistry Recent Labs  Lab 06/15/22 0055 06/15/22 0115 06/15/22 0600  NA 134* 134*  134*  --   K 4.7 4.8  4.8  --   CL 98 99  --   CO2 22  --   --   GLUCOSE 361* 347*  --   BUN 15 19  --   CREATININE 1.33* 1.20*  --   CALCIUM 7.7*  --   --   MG  --   --  1.6*  GFRNONAA 43*  --   --   ANIONGAP 14  --   --     Recent Labs  Lab 06/15/22 0055  PROT 5.9*  ALBUMIN 2.6*  AST 28  ALT 10  ALKPHOS 92  BILITOT 1.5*   Lipids No results for input(s): "CHOL", "TRIG", "HDL", "LABVLDL", "LDLCALC", "CHOLHDL" in the last 168 hours.  Hematology Recent Labs  Lab 06/15/22 0055 06/15/22 0115 06/15/22 0600  WBC 12.5*  --  10.3  RBC 3.72*  --  3.38*  HGB 11.1* 12.2  11.9* 10.4*  HCT 35.8* 36.0  35.0* 31.7*  MCV 96.2  --  93.8  MCH 29.8  --  30.8  MCHC 31.0  --  32.8  RDW 14.6  --  14.5  PLT 213  --  189   Thyroid No results for input(s): "TSH",  "FREET4" in the last 168 hours.  BNP Recent Labs  Lab 06/15/22 0055  BNP 677.5*    DDimer No results for input(s): "DDIMER" in the last 168 hours.   Radiology/Studies:  CT Head Wo Contrast  Result Date: 06/15/2022 CLINICAL DATA:  Head trauma, minor (Age >= 65y) EXAM: CT HEAD WITHOUT CONTRAST TECHNIQUE: Contiguous axial images were obtained from the base of the skull through the vertex without intravenous contrast. RADIATION DOSE REDUCTION: This exam was performed according to the departmental dose-optimization program which includes automated exposure control, adjustment of the mA and/or kV according to patient size and/or use of iterative reconstruction technique. COMPARISON:  None Available. FINDINGS: Brain: No evidence of large-territorial acute infarction. No parenchymal hemorrhage. No mass lesion. No extra-axial collection. No mass effect or midline shift. No hydrocephalus. Basilar cisterns are patent. Vascular: No hyperdense vessel. Skull: No acute fracture or focal lesion. Sinuses/Orbits: Paranasal sinuses and mastoid air cells are clear. Left lens replacement. Otherwise the orbits are unremarkable. Other: None. IMPRESSION: No acute intracranial abnormality. Electronically Signed   By: Iven Finn M.D.   On: 06/15/2022 02:28   DG Chest Port 1 View  Result Date: 06/15/2022 CLINICAL DATA:  Questionable sepsis. EXAM: PORTABLE CHEST 1 VIEW COMPARISON:  AP Lat 07/04/2016. FINDINGS: The heart is moderately enlarged. There is mild central vascular prominence without overt edema. The lungs are clear of infiltrates. There mild chronic interstitial changes. The mediastinum is normally outlined. There is calcification in the aortic arch. Degenerative changes and slight dextroscoliosis thoracic spine. IMPRESSION: 1. Cardiomegaly with mild central vascular prominence but no overt edema. 2. No other evidence of acute chest disease.  Chronic change. Electronically Signed   By: Telford Nab M.D.   On:  06/15/2022 01:38   DG Pelvis Portable  Result Date: 06/15/2022 CLINICAL DATA:  Fall EXAM: PORTABLE PELVIS 1-2 VIEWS COMPARISON:  04/09/2010 FINDINGS: Symmetric degenerative changes in the hips. No acute bony abnormality. Specifically, no fracture, subluxation, or dislocation. IMPRESSION: No acute bony abnormality. Electronically Signed   By: Rolm Baptise M.D.   On: 06/15/2022 01:35     Assessment and Plan:   Elevated troponin and LBBB -Patient never complained chest pain, unlikely ACS -High sensitive troponin 29 >108, will trend third -suspect demand ischemia from acute infection/fall/rhabdomyolysis/AKI - pending Echo, will follow  - LBBB is new, however, prior EKG in 2018 showed IVCD  Elevated BNP - CXR no acute finding - Echo pending, will follow   Acute hypoxic respiratory failure Sepsis 2/2 UTI and Flu A  Acute kidney injury Rhabdomyolysis Mechanical fall at home Type 2 diabetes Debility - per primary team   Thanks for the consultation - cardiology will follow with you.  Pixie Casino, MD, [facility], [place] Director of the [facility] Diplomate of the American Board of Clinical Lipidology Attending Cardiologist  Direct Dial: [phone]  Fax: [phone]  Website:  [email or web address]

## 2022-06-16 ENCOUNTER — Inpatient Hospital Stay (HOSPITAL_COMMUNITY): Payer: Medicare HMO

## 2022-06-16 DIAGNOSIS — R7989 Other specified abnormal findings of blood chemistry: Secondary | ICD-10-CM

## 2022-06-16 DIAGNOSIS — I5031 Acute diastolic (congestive) heart failure: Secondary | ICD-10-CM | POA: Diagnosis not present

## 2022-06-16 DIAGNOSIS — A419 Sepsis, unspecified organism: Secondary | ICD-10-CM | POA: Diagnosis not present

## 2022-06-16 DIAGNOSIS — I441 Atrioventricular block, second degree: Secondary | ICD-10-CM

## 2022-06-16 DIAGNOSIS — N179 Acute kidney failure, unspecified: Secondary | ICD-10-CM | POA: Diagnosis not present

## 2022-06-16 DIAGNOSIS — I2489 Other forms of acute ischemic heart disease: Secondary | ICD-10-CM | POA: Diagnosis not present

## 2022-06-16 DIAGNOSIS — N390 Urinary tract infection, site not specified: Secondary | ICD-10-CM | POA: Diagnosis not present

## 2022-06-16 LAB — CBC
HCT: 30 % — ABNORMAL LOW (ref 36.0–46.0)
Hemoglobin: 9.2 g/dL — ABNORMAL LOW (ref 12.0–15.0)
MCH: 29.7 pg (ref 26.0–34.0)
MCHC: 30.7 g/dL (ref 30.0–36.0)
MCV: 96.8 fL (ref 80.0–100.0)
Platelets: 175 10*3/uL (ref 150–400)
RBC: 3.1 MIL/uL — ABNORMAL LOW (ref 3.87–5.11)
RDW: 14.6 % (ref 11.5–15.5)
WBC: 5.7 10*3/uL (ref 4.0–10.5)
nRBC: 0 % (ref 0.0–0.2)

## 2022-06-16 LAB — ECHOCARDIOGRAM COMPLETE
AR max vel: 1.69 cm2
AV Area VTI: 1.66 cm2
AV Area mean vel: 1.79 cm2
AV Mean grad: 12.5 mmHg
AV Peak grad: 22 mmHg
Ao pk vel: 2.35 m/s
Area-P 1/2: 3.79 cm2
Calc EF: 64.3 %
Height: 67 in
MV M vel: 5.45 m/s
MV Peak grad: 118.8 mmHg
S' Lateral: 3.2 cm
Single Plane A2C EF: 60.6 %
Single Plane A4C EF: 66.5 %
Weight: 4800 oz

## 2022-06-16 LAB — COMPREHENSIVE METABOLIC PANEL
ALT: 11 U/L (ref 0–44)
AST: 24 U/L (ref 15–41)
Albumin: 2.1 g/dL — ABNORMAL LOW (ref 3.5–5.0)
Alkaline Phosphatase: 76 U/L (ref 38–126)
Anion gap: 8 (ref 5–15)
BUN: 17 mg/dL (ref 8–23)
CO2: 26 mmol/L (ref 22–32)
Calcium: 7.6 mg/dL — ABNORMAL LOW (ref 8.9–10.3)
Chloride: 102 mmol/L (ref 98–111)
Creatinine, Ser: 1.23 mg/dL — ABNORMAL HIGH (ref 0.44–1.00)
GFR, Estimated: 47 mL/min — ABNORMAL LOW (ref >60)
Glucose, Bld: 148 mg/dL — ABNORMAL HIGH (ref 70–99)
Potassium: 3.4 mmol/L — ABNORMAL LOW (ref 3.5–5.1)
Sodium: 136 mmol/L (ref 135–145)
Total Bilirubin: 0.3 mg/dL (ref 0.3–1.2)
Total Protein: 5.2 g/dL — ABNORMAL LOW (ref 6.5–8.1)

## 2022-06-16 LAB — GLUCOSE, CAPILLARY
Glucose-Capillary: 147 mg/dL — ABNORMAL HIGH (ref 70–99)
Glucose-Capillary: 160 mg/dL — ABNORMAL HIGH (ref 70–99)
Glucose-Capillary: 205 mg/dL — ABNORMAL HIGH (ref 70–99)
Glucose-Capillary: 179 mg/dL — ABNORMAL HIGH (ref 70–99)

## 2022-06-16 LAB — MAGNESIUM: Magnesium: 1.9 mg/dL (ref 1.7–2.4)

## 2022-06-16 MED ORDER — SPIRONOLACTONE 25 MG PO TABS
25.0000 mg | ORAL_TABLET | Freq: Every day | ORAL | Status: DC
  Administered 2022-06-16 – 2022-06-26 (×11): 25 mg via ORAL
  Filled 2022-06-16 (×11): qty 1

## 2022-06-16 MED ORDER — BACLOFEN 10 MG PO TABS
5.0000 mg | ORAL_TABLET | Freq: Once | ORAL | Status: AC
  Administered 2022-06-16: 5 mg via ORAL
  Filled 2022-06-16: qty 1

## 2022-06-16 MED ORDER — POTASSIUM CHLORIDE CRYS ER 10 MEQ PO TBCR
30.0000 meq | EXTENDED_RELEASE_TABLET | ORAL | Status: AC
  Administered 2022-06-16 (×2): 30 meq via ORAL
  Filled 2022-06-16 (×2): qty 3

## 2022-06-16 MED ORDER — PROCHLORPERAZINE EDISYLATE 10 MG/2ML IJ SOLN
10.0000 mg | Freq: Four times a day (QID) | INTRAMUSCULAR | Status: DC | PRN
  Administered 2022-06-16: 10 mg via INTRAVENOUS
  Filled 2022-06-16: qty 2

## 2022-06-16 MED ORDER — FUROSEMIDE 10 MG/ML IJ SOLN
40.0000 mg | Freq: Two times a day (BID) | INTRAMUSCULAR | Status: DC
  Administered 2022-06-16 (×2): 40 mg via INTRAVENOUS
  Filled 2022-06-16 (×2): qty 4

## 2022-06-16 MED ORDER — ATORVASTATIN CALCIUM 10 MG PO TABS: 20.0000 mg | ORAL_TABLET | Freq: Every day | ORAL | Status: DC

## 2022-06-16 MED ORDER — ORAL CARE MOUTH RINSE: 15.0000 mL | OROMUCOSAL | Status: DC | PRN

## 2022-06-16 MED ORDER — ACETAMINOPHEN 325 MG PO TABS
650.0000 mg | ORAL_TABLET | Freq: Four times a day (QID) | ORAL | Status: DC | PRN
  Administered 2022-06-16 – 2022-06-22 (×7): 650 mg via ORAL
  Filled 2022-06-16 (×7): qty 2

## 2022-06-16 MED ORDER — CLOPIDOGREL BISULFATE 75 MG PO TABS: 75.0000 mg | ORAL_TABLET | Freq: Every day | ORAL | Status: DC

## 2022-06-16 NOTE — Progress Notes (Signed)
   06/16/22 0010  Provider Notification  Provider Name/Title Dr. Myna Hidalgo  Date Provider Notified 06/16/22  Time Provider Notified 0010  Method of Notification Page  Notification Reason Requested by patient/family  Provider response See new orders  Date of Provider Response 06/16/22   Patient is complaining of bilateral leg pain.  There is redness and both extremities are wrapped in brown dressings done by home health nurse.  She is requesting Tylenol.  She is also refusing her scheduled atenolol.  She states her PCP told her not to take it.  Dr. Myna Hidalgo called and made aware of the above.  Order for Tylenol received and implemented.  Day MD to address atenolol refusal.  Earleen Reaper RN

## 2022-06-16 NOTE — Progress Notes (Signed)
   06/16/22 0245  Provider Notification  Provider Name/Title Dr. Myna Hidalgo  Date Provider Notified 06/16/22  Time Provider Notified 0245  Method of Notification Page  Notification Reason Requested by patient/family  Provider response See new orders   Patient is now wanting baclofen ordered for the pain in her legs.  Dr. Myna Hidalgo made aware.  Order received and implemented.  Earleen Reaper RN

## 2022-06-16 NOTE — Progress Notes (Signed)
Cardiology Progress Note  Patient ID: Jody Simpson MRN: [id number] DOB: [birth year] Date of Encounter: 06/16/2022  Primary Cardiologist: None  Subjective   Chief Complaint: SOB  HPI: Reports shortness of breath.  Ejection fraction normal.  Evidence of 2-1 AV block overnight.  ROS:  All other ROS reviewed and negative. Pertinent positives noted in the HPI.     Inpatient Medications  Scheduled Meds:  enoxaparin (LOVENOX) injection  40 mg Subcutaneous Q24H   famotidine  20 mg Oral BID   insulin aspart  0-5 Units Subcutaneous QHS   insulin aspart  0-9 Units Subcutaneous TID WC   montelukast  10 mg Oral QHS   oseltamivir  75 mg Oral BID   potassium chloride  30 mEq Oral Q4H   Continuous Infusions:  cefTRIAXone (ROCEPHIN)  IV 2 g (06/16/22 0234)   PRN Meds: acetaminophen, albuterol, guaiFENesin-dextromethorphan, mouth rinse   Vital Signs   Vitals:   06/16/22 0237 06/16/22 0413 06/16/22 0823 06/16/22 0842  BP:  (!) 102/43 (!) 151/68   Pulse:  (!) 58 (!) 52   Resp:  18 20   Temp:  99 F (37.2 C) 99.5 F (37.5 C)   TempSrc:  Oral Oral   SpO2: 95% 95% 97% 97%  Weight:      Height:        Intake/Output Summary (Last 24 hours) at 06/16/2022 0918 Last data filed at 06/16/2022 0600 Gross per 24 hour  Intake 869.84 ml  Output 500 ml  Net 369.84 ml      06/15/2022   12:47 AM 07/25/2016   10:20 AM 07/23/2016    1:07 PM  Last 3 Weights  Weight (lbs) 300 lb 319 lb 297 lb  Weight (kg) 136.079 kg 144.697 kg 134.718 kg      Telemetry  Overnight telemetry shows sinus rhythm heart rate 70s, first-degree AV block, which I personally reviewed.   ECG  The most recent ECG shows sinus rhythm heart rate 64 versus 2-1 block, left bundle branch block, which I personally reviewed.   Physical Exam   Vitals:   06/16/22 0237 06/16/22 0413 06/16/22 0823 06/16/22 0842  BP:  (!) 102/43 (!) 151/68   Pulse:  (!) 58 (!) 52   Resp:  18 20   Temp:  99 F (37.2 C) 99.5 F (37.5 C)    TempSrc:  Oral Oral   SpO2: 95% 95% 97% 97%  Weight:      Height:        Intake/Output Summary (Last 24 hours) at 06/16/2022 0918 Last data filed at 06/16/2022 0600 Gross per 24 hour  Intake 869.84 ml  Output 500 ml  Net 369.84 ml       06/15/2022   12:47 AM 07/25/2016   10:20 AM 07/23/2016    1:07 PM  Last 3 Weights  Weight (lbs) 300 lb 319 lb 297 lb  Weight (kg) 136.079 kg 144.697 kg 134.718 kg    Body mass index is 46.99 kg/m.  General: Well nourished, well developed, in no acute distress Head: Atraumatic, normal size  Eyes: PEERLA, EOMI  Neck: Supple, JVD 78 cm of water Endocrine: No thryomegaly Cardiac: Normal S1, S2; RRR; no murmurs, rubs, or gallops Lungs: Diminished breath sounds bilaterally Abd: Soft, nontender, no hepatomegaly  Ext: 1+ pitting edema Musculoskeletal: No deformities, BUE and BLE strength normal and equal Skin: Warm and dry, no rashes   Neuro: Alert and oriented to person, place, time, and situation, CNII-XII grossly intact, no focal deficits  Psych: Normal mood and affect   Labs  High Sensitivity Troponin:   Recent Labs  Lab 06/15/22 0055 06/15/22 0600 06/15/22 0937  TROPONINIHS 29* 108* 134*     Cardiac EnzymesNo results for input(s): "TROPONINI" in the last 168 hours. No results for input(s): "TROPIPOC" in the last 168 hours.  Chemistry Recent Labs  Lab 06/15/22 0055 06/15/22 0115 06/16/22 0454  NA 134* 134*  134* 136  K 4.7 4.8  4.8 3.4*  CL 98 99 102  CO2 22  --  26  GLUCOSE 361* 347* 148*  BUN '15 19 17  '$ CREATININE 1.33* 1.20* 1.23*  CALCIUM 7.7*  --  7.6*  PROT 5.9*  --  5.2*  ALBUMIN 2.6*  --  2.1*  AST 28  --  24  ALT 10  --  11  ALKPHOS 92  --  76  BILITOT 1.5*  --  0.3  GFRNONAA 43*  --  47*  ANIONGAP 14  --  8    Hematology Recent Labs  Lab 06/15/22 0055 06/15/22 0115 06/15/22 0600 06/16/22 0454  WBC 12.5*  --  10.3 5.7  RBC 3.72*  --  3.38* 3.10*  HGB 11.1* 12.2  11.9* 10.4* 9.2*  HCT 35.8* 36.0  35.0*  31.7* 30.0*  MCV 96.2  --  93.8 96.8  MCH 29.8  --  30.8 29.7  MCHC 31.0  --  32.8 30.7  RDW 14.6  --  14.5 14.6  PLT 213  --  189 175   BNP Recent Labs  Lab 06/15/22 0055  BNP 677.5*    DDimer No results for input(s): "DDIMER" in the last 168 hours.   Radiology  ECHOCARDIOGRAM COMPLETE  Result Date: 06/16/2022    ECHOCARDIOGRAM REPORT   Patient Name:   KERIN KREN Gosnell Date of Exam: 06/16/2022 Medical Rec #:  [id number]       Height:       67.0 in Accession #:    [id number]      Weight:       300.0 lb Date of Birth:  1951-05-31       BSA:          2.403 m Patient Age:    72 years        BP:           151/68 mmHg Patient Gender: F               HR:           55 bpm. Exam Location:  Inpatient Procedure: 2D Echo, Color Doppler and Cardiac Doppler Indications:    elevated troponin  History:        Patient has no prior history of Echocardiogram examinations.                 Risk Factors:Diabetes, Hypertension and Dyslipidemia.  Sonographer:    Phineas Douglas Referring Phys: [id number] [place]  1. Left ventricular ejection fraction, by estimation, is 60 to 65%. The left ventricle has normal function. The left ventricle has no regional wall motion abnormalities. There is mild left ventricular hypertrophy. Left ventricular diastolic parameters are indeterminate.  2. Right ventricular systolic function is normal. The right ventricular size is normal. There is moderately elevated pulmonary artery systolic pressure.  3. Left atrial size was moderately dilated.  4. The mitral valve is degenerative. Mild mitral valve regurgitation. No evidence of mitral stenosis. Severe mitral annular calcification.  5. The aortic valve was not well  visualized. There is moderate calcification of the aortic valve. There is moderate thickening of the aortic valve. Aortic valve regurgitation is not visualized. Mild aortic valve stenosis.  6. The inferior vena cava is normal in size with greater than 50% respiratory  variability, suggesting right atrial pressure of 3 mmHg. FINDINGS  Left Ventricle: Left ventricular ejection fraction, by estimation, is 60 to 65%. The left ventricle has normal function. The left ventricle has no regional wall motion abnormalities. The left ventricular internal cavity size was normal in size. There is  mild left ventricular hypertrophy. Left ventricular diastolic parameters are indeterminate. Right Ventricle: The right ventricular size is normal. No increase in right ventricular wall thickness. Right ventricular systolic function is normal. There is moderately elevated pulmonary artery systolic pressure. The tricuspid regurgitant velocity is 3.12 m/s, and with an assumed right atrial pressure of 15 mmHg, the estimated right ventricular systolic pressure is 47.4 mmHg. Left Atrium: Left atrial size was moderately dilated. Right Atrium: Right atrial size was normal in size. Pericardium: There is no evidence of pericardial effusion. Mitral Valve: The mitral valve is degenerative in appearance. There is severe thickening of the mitral valve leaflet(s). There is severe calcification of the mitral valve leaflet(s). Severe mitral annular calcification. Mild mitral valve regurgitation. No evidence of mitral valve stenosis. Tricuspid Valve: The tricuspid valve is normal in structure. Tricuspid valve regurgitation is mild . No evidence of tricuspid stenosis. Aortic Valve: The aortic valve was not well visualized. There is moderate calcification of the aortic valve. There is moderate thickening of the aortic valve. Aortic valve regurgitation is not visualized. Mild aortic stenosis is present. Aortic valve mean gradient measures 12.5 mmHg. Aortic valve peak gradient measures 22.0 mmHg. Aortic valve area, by VTI measures 1.66 cm. Pulmonic Valve: The pulmonic valve was normal in structure. Pulmonic valve regurgitation is trivial. No evidence of pulmonic stenosis. Aorta: The aortic root is normal in size and  structure. Venous: The inferior vena cava is normal in size with greater than 50% respiratory variability, suggesting right atrial pressure of 3 mmHg. IAS/Shunts: No atrial level shunt detected by color flow Doppler.  LEFT VENTRICLE PLAX 2D LVIDd:         5.20 cm      Diastology LVIDs:         3.20 cm      LV e' medial:    8.49 cm/s LV PW:         1.30 cm      LV E/e' medial:  25.0 LV IVS:        1.20 cm      LV e' lateral:   11.00 cm/s LVOT diam:     1.90 cm      LV E/e' lateral: 19.3 LV SV:         75 LV SV Index:   31 LVOT Area:     2.84 cm  LV Volumes (MOD) LV vol d, MOD A2C: 125.0 ml LV vol d, MOD A4C: 137.0 ml LV vol s, MOD A2C: 49.2 ml LV vol s, MOD A4C: 45.9 ml LV SV MOD A2C:     75.8 ml LV SV MOD A4C:     137.0 ml LV SV MOD BP:      85.5 ml RIGHT VENTRICLE          IVC RV Basal diam:  4.40 cm  IVC diam: 2.90 cm TAPSE (M-mode): 2.0 cm LEFT ATRIUM  Index        RIGHT ATRIUM           Index LA diam:        4.70 cm 1.96 cm/m   RA Area:     18.30 cm LA Vol (A2C):   70.4 ml 29.30 ml/m  RA Volume:   46.70 ml  19.44 ml/m LA Vol (A4C):   76.3 ml 31.76 ml/m LA Biplane Vol: 73.5 ml 30.59 ml/m  AORTIC VALVE                     PULMONIC VALVE AV Area (Vmax):    1.69 cm      PR End Diast Vel: 6.25 msec AV Area (Vmean):   1.79 cm AV Area (VTI):     1.66 cm AV Vmax:           234.75 cm/s AV Vmean:          163.500 cm/s AV VTI:            0.452 m AV Peak Grad:      22.0 mmHg AV Mean Grad:      12.5 mmHg LVOT Vmax:         140.00 cm/s LVOT Vmean:        103.000 cm/s LVOT VTI:          0.265 m LVOT/AV VTI ratio: 0.59  AORTA Ao Root diam: 3.00 cm Ao Asc diam:  3.20 cm MITRAL VALVE                TRICUSPID VALVE MV Area (PHT): 3.79 cm     TR Peak grad:   38.9 mmHg MV Decel Time: 200 msec     TR Vmax:        312.00 cm/s MR Peak grad: 118.8 mmHg MR Mean grad: 80.0 mmHg     SHUNTS MR Vmax:      545.00 cm/s   Systemic VTI:  0.26 m MR Vmean:     431.0 cm/s    Systemic Diam: 1.90 cm MV E velocity: 212.00 cm/s  Jenkins Rouge MD Electronically signed by Jenkins Rouge MD Signature Date/Time: 06/16/2022/8:47:50 AM    Final    CT Head Wo Contrast  Result Date: 06/15/2022 CLINICAL DATA:  Head trauma, minor (Age >= 65y) EXAM: CT HEAD WITHOUT CONTRAST TECHNIQUE: Contiguous axial images were obtained from the base of the skull through the vertex without intravenous contrast. RADIATION DOSE REDUCTION: This exam was performed according to the departmental dose-optimization program which includes automated exposure control, adjustment of the mA and/or kV according to patient size and/or use of iterative reconstruction technique. COMPARISON:  None Available. FINDINGS: Brain: No evidence of large-territorial acute infarction. No parenchymal hemorrhage. No mass lesion. No extra-axial collection. No mass effect or midline shift. No hydrocephalus. Basilar cisterns are patent. Vascular: No hyperdense vessel. Skull: No acute fracture or focal lesion. Sinuses/Orbits: Paranasal sinuses and mastoid air cells are clear. Left lens replacement. Otherwise the orbits are unremarkable. Other: None. IMPRESSION: No acute intracranial abnormality. Electronically Signed   By: Iven Finn M.D.   On: 06/15/2022 02:28   DG Chest Port 1 View  Result Date: 06/15/2022 CLINICAL DATA:  Questionable sepsis. EXAM: PORTABLE CHEST 1 VIEW COMPARISON:  AP Lat 07/04/2016. FINDINGS: The heart is moderately enlarged. There is mild central vascular prominence without overt edema. The lungs are clear of infiltrates. There mild chronic interstitial changes. The mediastinum is normally outlined. There is calcification in the aortic  arch. Degenerative changes and slight dextroscoliosis thoracic spine. IMPRESSION: 1. Cardiomegaly with mild central vascular prominence but no overt edema. 2. No other evidence of acute chest disease.  Chronic change. Electronically Signed   By: Telford Nab M.D.   On: 06/15/2022 01:38   DG Pelvis Portable  Result Date:  06/15/2022 CLINICAL DATA:  Fall EXAM: PORTABLE PELVIS 1-2 VIEWS COMPARISON:  04/09/2010 FINDINGS: Symmetric degenerative changes in the hips. No acute bony abnormality. Specifically, no fracture, subluxation, or dislocation. IMPRESSION: No acute bony abnormality. Electronically Signed   By: Rolm Baptise M.D.   On: 06/15/2022 01:35    Cardiac Studies  TTE 06/16/2022  1. Left ventricular ejection fraction, by estimation, is 60 to 65%. The  left ventricle has normal function. The left ventricle has no regional  wall motion abnormalities. There is mild left ventricular hypertrophy.  Left ventricular diastolic parameters  are indeterminate.   2. Right ventricular systolic function is normal. The right ventricular  size is normal. There is moderately elevated pulmonary artery systolic  pressure.   3. Left atrial size was moderately dilated.   4. The mitral valve is degenerative. Mild mitral valve regurgitation. No  evidence of mitral stenosis. Severe mitral annular calcification.   5. The aortic valve was not well visualized. There is moderate  calcification of the aortic valve. There is moderate thickening of the  aortic valve. Aortic valve regurgitation is not visualized. Mild aortic  valve stenosis.   6. The inferior vena cava is normal in size with greater than 50%  respiratory variability, suggesting right atrial pressure of 3 mmHg.   Patient Profile  LORETA BLOUCH is a 72 y.o. female with diabetes, morbid obesity, hypertension who was admitted on 06/15/2022 for acute hypoxic respiratory failure secondary to influenza pneumonia.  Cardiology consulted for elevated troponin and volume overload.  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  Start Lasix 40 mg IV twice daily.  Add Aldactone 25 mg daily. -Ejection fraction 60-65%.  No regional wall motion abnormalities. -Suspected trigger with  influenza and pneumonia.  # Left bundle branch block # 2-1 block, high-grade AV conduction disease -Telemetry this morning shows evidence of 2-1 block overnight.   -Her EKG from yesterday also demonstrates sinus rhythm with a left bundle branch block.  On close review I think there is also 2-1 block present. -Hold atenolol.  No beta-blockers or AV nodal agents.  Discussed case with EP.  Will wash out beta-blocker today.  If she continues to have these issues she will need consideration for pacing.  # Elevated troponin, demand ischemia -Currently admitted with influenza.  Had mild rhabdomyolysis.  Troponins are minimally elevated.  No chest pain symptoms.  Ejection fraction normal.  No wall motion abnormality. -Suspect this is secondary process due to sepsis and rhabdomyolysis. -This is not represent acute coronary syndrome.  No need to treat as such.  She should be on a statin but has statin allergies.  I will discuss this with her while she is here.  Also aspirin allergy.  Could consider Plavix.  For now we will just hold the course.  Can be worked up as an outpatient with a stress test.  # Sepsis # Influenza # Rhabdomyolysis # Mechanical fall -Per primary team  For questions or updates, please contact [place] Please consult [email or web address] for contact info under     Signed, [place] Bells T. Audie Box, MD,  [place]  06/16/2022 9:18 AM

## 2022-06-16 NOTE — Progress Notes (Signed)
Progress Note   Patient: Jody Simpson YDX:[id number] DOB: [birth year] DOA: 06/15/2022     1 DOS: the patient was seen and examined on 06/16/2022   Brief hospital course: 72 y.o. female with medical history significant for SVT, essential hypertension, hyperlipidemia, GERD, type 2 diabetes, who presented to [facility] ED from independent living facility via EMS due to generalized weakness.  Endorses feeling so weak that she slid out of her chair and ended up on the floor.  States she was diagnosed with the flu on Friday.  At the facility, they could not get her up per their policy.  Reportedly, per ILF staff, patient was unresponsive on the floor.  EMS was called.  Upon EMS assessment vital signs notable for soft BPs and bradycardia with heart rate of 50.   In the ED, febrile with Tmax 101.2.  influenza A positive.  UA positive for pyuria.  Due to concern for sepsis with leukocytosis, urine culture and blood cultures were obtained.  She was started on Rocephin empirically.  Tamiflu was added.  Assessment and Plan: Sepsis secondary to UTI, POA Fever with Tmax 101.2, leukocytosis 12.5. Follow urine culture, peripheral blood cultures x 2 Continue Rocephin empirically Pt reports feeling better today Recheck bmet and cbc   Generalized weakness, suspect multifactorial Treat underlying conditions PT OT consulted, thus far, recs for [facility] Fall precautions   Elevated troponin with LBBB, unclear if new Denies any anginal symptoms -Trop peaked to just over 100 Cardiology was consulted, suspected demand ischemia 2d echo with EF 60%, no WMA Cardiology recs to hold beta blockers/AV nodal agents. May need pacing if pt has continued issues with high grade 2-1 block   Influenza A viral infection Tamiflu 75 mg twice daily x 5 days DuoNebs every 6 hours   Acute hypoxic respiratory failure secondary to influenza A viral infection O2 saturation in the 80s at the independent living facility Requiring 3 L to  maintain O2 saturation greater than 92%. Incentive spirometer, bronchodilators.   AKI, suspect prerenal in the setting of dehydration Baseline creatinine 1.0 Presented with creatinine 1.3.  GFR 43. Given IVF in ED Monitor urine output Avoid nephrotoxic agents and hypotension. Cr 1.23   Type 2 diabetes with hyperglycemia A1c 8.2 Cont SSI as needed   Possible syncope Reportedly, per ILF staff patient was unresponsive on the floor. Follow 2D echo performed Closely monitor on telemetry Fall precautions   Cardiomegaly, with acute heart failure with preserved EF, new diagnosis BNP 677, trop peaked to 100 Clinically hypervolemic . Lasix '40mg'$  IV BID ordered by Cardiology with Aldactone '25mg'$  daily added   Elevated CPK CPK 1000 Given IVF in ED   Isolated hyperbilirubinemia AST ALT and alkaline phosphatase normal. T. bili 1.5 Cont to monitor   Hypomagnesemia -replaced  Hypokalemia -Will replace     Subjective: States feeling better today  Physical Exam: Vitals:   06/16/22 0237 06/16/22 0413 06/16/22 0823 06/16/22 0842  BP:  (!) 102/43 (!) 151/68   Pulse:  (!) 58 (!) 52   Resp:  18 20   Temp:  99 F (37.2 C) 99.5 F (37.5 C)   TempSrc:  Oral Oral   SpO2: 95% 95% 97% 97%  Weight:      Height:       General exam: Conversant, in no acute distress Respiratory system: normal chest rise, clear, no audible wheezing Cardiovascular system: regular rhythm, s1-s2 Gastrointestinal system: Nondistended, nontender, pos BS Central nervous system: No seizures, no tremors Extremities: No cyanosis, no  joint deformities Skin: No rashes, no pallor Psychiatry: Affect normal // no auditory hallucinations   Data Reviewed:  Labs reviewed: Na 136, K 3.4, Cr 1.23, Mg 1.9  Family Communication: Pt in room, family not at bedside  Disposition: Status is: Inpatient Remains inpatient appropriate because: Severity of illness  Planned Discharge Destination:  Home     Author: Marylu Lund, MD 06/16/2022 4:12 PM  For on call review [email or web address].

## 2022-06-16 NOTE — Evaluation (Signed)
Physical Therapy Evaluation Patient Details Name: Jody Simpson MRN: [id number] DOB: [birth year] Today's Date: 06/16/2022  History of Present Illness  Jody Simpson is a 72 y.o. female with medical history significant for SVT, essential hypertension, hyperlipidemia, GERD, type 2 diabetes, who presented to [facility] ED from independent living facility via EMS due to generalized weakness. She slid out of her chair. Admitted for sepsis secondary to UTI and + flu.  Clinical Impression   Pt admitted with above diagnosis. Lives at home in an independent living apt; Prior to admission, pt was able to manage independently at rollator; Presents to PT with generalized weakness and decr activity tolerance;  Today, pt limited by nausea, and need to move bowels; Needed 2 person Max assist to roll and place bed pan; in talking with pt, and taking performance on OT eval into account, it is not unreasonable that pt can return home, especially as her medical status improves; She indicated she wants to return to her apartment, and we will watch progress closely; Pt currently with functional limitations due to the deficits listed below (see PT Problem List). Pt will benefit from skilled PT to increase their independence and safety with mobility to allow discharge to the venue listed below.       She MUST have the equipment she needs to mobilize and perform ADLs safely; She needs a bariatric BSC and bed, as well as a wide recliner; OT asked for bari [facility] yesterday, and I will echo her request -- if we don't have a bari BSC available to deliver to her room, then who do we ask to order more for the hospital?     Recommendations for follow up therapy are one component of a multi-disciplinary discharge planning process, led by the attending physician.  Recommendations may be updated based on patient status, additional functional criteria and insurance authorization.  Follow Up Recommendations Home health PT (if slow progress  must consider post-acute rehab)      Assistance Recommended at Discharge Intermittent Supervision/Assistance  Patient can return home with the following  A lot of help with walking and/or transfers;Help with stairs or ramp for entrance;Assist for transportation;A lot of help with bathing/dressing/bathroom    Equipment Recommendations Other (comment) (would like to verify that her rollator is wide enough)  Recommendations for Other Services  OT consult (as ordered)    Functional Status Assessment Patient has had a recent decline in their functional status and demonstrates the ability to make significant improvements in function in a reasonable and predictable amount of time.     Precautions / Restrictions Precautions Precautions: Fall      Mobility  Bed Mobility Overal bed mobility: Needs Assistance Bed Mobility: Rolling Rolling: +2 for physical assistance, Max assist         General bed mobility comments: Assisted pt in rolling for bed pan placement; Needing two person assist, but more due to limited space in the bed to shift and roll; noted team is working on getting a bari bed    Transfers                   General transfer comment: Pt experincing nausea, held further mobility    Ambulation/Gait                  Stairs            Wheelchair Mobility    Modified Rankin (Stroke Patients Only)       Balance  Pertinent Vitals/Pain Pain Assessment Pain Assessment: Faces Faces Pain Scale: Hurts little more Pain Location: RLE with movement Pain Descriptors / Indicators: Grimacing (as RLE was lifted in order to roll) Pain Intervention(s): Repositioned    Home Living Family/patient expects to be discharged to:: Other (Comment) Living Arrangements: Alone             Home Layout: One level Home Equipment: Rollator (4 wheels);Tub bench;Adaptive equipment Additional  Comments: lift chair    Prior Function Prior Level of Function : Independent/Modified Independent                     Hand Dominance   Dominant Hand: Right    Extremity/Trunk Assessment   Upper Extremity Assessment Upper Extremity Assessment: Defer to OT evaluation    Lower Extremity Assessment Lower Extremity Assessment: Generalized weakness (Large body habitus)       Communication   Communication: No difficulties  Cognition Arousal/Alertness: Awake/alert Behavior During Therapy: WFL for tasks assessed/performed Overall Cognitive Status: Within Functional Limits for tasks assessed                                          General Comments General comments (skin integrity, edema, etc.): Pt needs bariatric bed, bariatric BSC, and wide recliner to safely keep her mobility up; Will discuss with her care team    Exercises     Assessment/Plan    PT Assessment Patient needs continued PT services  PT Problem List Decreased strength;Decreased range of motion;Decreased activity tolerance;Decreased balance;Decreased mobility;Decreased coordination;Decreased knowledge of precautions;Obesity;Pain       PT Treatment Interventions DME instruction;Gait training;Functional mobility training;Therapeutic activities;Therapeutic exercise;Balance training;Neuromuscular re-education;Patient/family education    PT Goals (Current goals can be found in the Care Plan section)  Acute Rehab PT Goals Patient Stated Goal: would like to return to her independent living apt PT Goal Formulation: With patient Time For Goal Achievement: 06/30/22 Potential to Achieve Goals: Fair    Frequency Min 3X/week     Co-evaluation               AM-PAC PT "6 Clicks" Mobility  Outcome Measure Help needed turning from your back to your side while in a flat bed without using bedrails?: Total Help needed moving from lying on your back to sitting on the side of a flat bed without  using bedrails?: A Lot Help needed moving to and from a bed to a chair (including a wheelchair)?: A Lot Help needed standing up from a chair using your arms (e.g., wheelchair or bedside chair)?: A Lot Help needed to walk in hospital room?: Total Help needed climbing 3-5 steps with a railing? : Total 6 Click Score: 9    End of Session Equipment Utilized During Treatment: Other (comment) (bed pads) Activity Tolerance: Patient tolerated treatment well Patient left: in bed;with call bell/phone within reach (on bed pan) Nurse Communication: Mobility status;Other (comment) (Needs a bariatric BSC) PT Visit Diagnosis: Muscle weakness (generalized) (M62.81);Other abnormalities of gait and mobility (R26.89)    Time: 7494-4967 PT Time Calculation (min) (ACUTE ONLY): 14 min   Charges:   PT Evaluation $PT Eval Moderate Complexity: [place], PT  Acute Rehabilitation Services Office [phone]   Colletta Maryland 06/16/2022, 3:10 PM

## 2022-06-16 NOTE — Progress Notes (Signed)
  Echocardiogram 2D Echocardiogram has been performed.  Frances Furbish 06/16/2022, 10:55 AM

## 2022-06-17 ENCOUNTER — Other Ambulatory Visit: Payer: Self-pay | Admitting: Cardiology

## 2022-06-17 ENCOUNTER — Encounter: Payer: Self-pay | Admitting: *Deleted

## 2022-06-17 ENCOUNTER — Inpatient Hospital Stay: Payer: Medicare HMO

## 2022-06-17 DIAGNOSIS — I441 Atrioventricular block, second degree: Secondary | ICD-10-CM | POA: Diagnosis not present

## 2022-06-17 DIAGNOSIS — I454 Nonspecific intraventricular block: Secondary | ICD-10-CM

## 2022-06-17 DIAGNOSIS — I447 Left bundle-branch block, unspecified: Secondary | ICD-10-CM

## 2022-06-17 DIAGNOSIS — I5031 Acute diastolic (congestive) heart failure: Secondary | ICD-10-CM | POA: Diagnosis not present

## 2022-06-17 DIAGNOSIS — I2489 Other forms of acute ischemic heart disease: Secondary | ICD-10-CM | POA: Diagnosis not present

## 2022-06-17 DIAGNOSIS — I440 Atrioventricular block, first degree: Secondary | ICD-10-CM

## 2022-06-17 DIAGNOSIS — A419 Sepsis, unspecified organism: Secondary | ICD-10-CM | POA: Diagnosis not present

## 2022-06-17 DIAGNOSIS — N390 Urinary tract infection, site not specified: Secondary | ICD-10-CM | POA: Diagnosis not present

## 2022-06-17 DIAGNOSIS — N179 Acute kidney failure, unspecified: Secondary | ICD-10-CM | POA: Diagnosis not present

## 2022-06-17 LAB — COMPREHENSIVE METABOLIC PANEL
ALT: 10 U/L (ref 0–44)
AST: 22 U/L (ref 15–41)
Albumin: 2.3 g/dL — ABNORMAL LOW (ref 3.5–5.0)
Alkaline Phosphatase: 84 U/L (ref 38–126)
Anion gap: 9 (ref 5–15)
BUN: 18 mg/dL (ref 8–23)
CO2: 29 mmol/L (ref 22–32)
Calcium: 7.8 mg/dL — ABNORMAL LOW (ref 8.9–10.3)
Chloride: 100 mmol/L (ref 98–111)
Creatinine, Ser: 1.15 mg/dL — ABNORMAL HIGH (ref 0.44–1.00)
GFR, Estimated: 51 mL/min — ABNORMAL LOW (ref >60)
Glucose, Bld: 212 mg/dL — ABNORMAL HIGH (ref 70–99)
Potassium: 4.1 mmol/L (ref 3.5–5.1)
Sodium: 138 mmol/L (ref 135–145)
Total Bilirubin: 0.4 mg/dL (ref 0.3–1.2)
Total Protein: 5.5 g/dL — ABNORMAL LOW (ref 6.5–8.1)

## 2022-06-17 LAB — CBC
HCT: 34 % — ABNORMAL LOW (ref 36.0–46.0)
Hemoglobin: 10.8 g/dL — ABNORMAL LOW (ref 12.0–15.0)
MCH: 30.2 pg (ref 26.0–34.0)
MCHC: 31.8 g/dL (ref 30.0–36.0)
MCV: 95 fL (ref 80.0–100.0)
Platelets: 174 10*3/uL (ref 150–400)
RBC: 3.58 MIL/uL — ABNORMAL LOW (ref 3.87–5.11)
RDW: 14.6 % (ref 11.5–15.5)
WBC: 5.8 10*3/uL (ref 4.0–10.5)
nRBC: 0 % (ref 0.0–0.2)

## 2022-06-17 LAB — URINE CULTURE: Culture: >=100000 — AB

## 2022-06-17 LAB — GLUCOSE, CAPILLARY
Glucose-Capillary: 184 mg/dL — ABNORMAL HIGH (ref 70–99)
Glucose-Capillary: 155 mg/dL — ABNORMAL HIGH (ref 70–99)
Glucose-Capillary: 206 mg/dL — ABNORMAL HIGH (ref 70–99)
Glucose-Capillary: 247 mg/dL — ABNORMAL HIGH (ref 70–99)

## 2022-06-17 LAB — LIPID PANEL
Cholesterol: 140 mg/dL (ref 0–200)
HDL: 29 mg/dL — ABNORMAL LOW (ref >40)
LDL Cholesterol: 78 mg/dL (ref 0–99)
Total CHOL/HDL Ratio: 4.8 RATIO
Triglycerides: 164 mg/dL — ABNORMAL HIGH (ref <150)
VLDL: 33 mg/dL (ref 0–40)

## 2022-06-17 MED ORDER — LISINOPRIL 20 MG PO TABS
20.0000 mg | ORAL_TABLET | Freq: Every day | ORAL | Status: DC
  Administered 2022-06-17 – 2022-06-18 (×2): 20 mg via ORAL
  Filled 2022-06-17 (×2): qty 1

## 2022-06-17 NOTE — Progress Notes (Unsigned)
Enrolled for [employer] to mail a ZIO XT long term holter monitor to the patients address on file.   Letter with instructions mailed to patient.  Dr. Eleonore Chiquito to read.

## 2022-06-17 NOTE — Progress Notes (Signed)
Physical Therapy Treatment Patient Details Name: Jody Simpson MRN: [id number] DOB: Dec 05, 1950 Today's Date: 06/17/2022   History of Present Illness Jody Simpson is a 72 y.o. female with medical history significant for SVT, essential hypertension, hyperlipidemia, GERD, type 2 diabetes, who presented to [facility] ED from independent living facility via EMS due to generalized weakness. She slid out of her chair. Admitted for sepsis secondary to UTI and + flu.    PT Comments    Continuing work on functional mobility and activity tolerance;  able to get up and OOB today with 2 person assist for safety; Dependent on momentum for standing from low surface; Took pivotal steps be to recliner with 2 person assist for safety and wide rollator; session conducted on room air and O2 sats ranged 98-94%;   I'm concerned for Ms. Splinter going home to her independent living apt unless she makes fast progress here acutely; she must be modified independent to dc home, and currently needs two person assist to stand and transfer; updated rec to SNF for post-acute rehab to maximize independence and safety with mobility and ADLs  Recommendations for follow up therapy are one component of a multi-disciplinary discharge planning process, led by the attending physician.  Recommendations may be updated based on patient status, additional functional criteria and insurance authorization.  Follow Up Recommendations  Skilled nursing-short term rehab (<3 hours/day) (Must be modified independent to dc back to independent living apt; Will monitor for progress; at this point, recommend post-acute rehab) Can patient physically be transported by private vehicle: No (soon)   Assistance Recommended at Discharge Intermittent Supervision/Assistance  Patient can return home with the following A lot of help with walking and/or transfers;Help with stairs or ramp for entrance;Assist for transportation;A lot of help with  bathing/dressing/bathroom   Equipment Recommendations  None recommended by PT    Recommendations for Other Services       Precautions / Restrictions Precautions Precautions: Fall Precaution Comments: watch O2 sats Restrictions Weight Bearing Restrictions: No     Mobility  Bed Mobility Overal bed mobility: Needs Assistance Bed Mobility: Supine to Sit     Supine to sit: Mod assist     General bed mobility comments: Incr time, with the need for assist to move RLE toawrds edge of bed; Light mod handheld assist to pull to sit and use of bed pad to square off hips at EOB    Transfers Overall transfer level: Needs assistance Equipment used: Rollator (4 wheels) Transfers: Sit to/from Stand, Bed to chair/wheelchair/BSC Sit to Stand: +2 physical assistance, Mod assist   Step pivot transfers: +2 physical assistance, +2 safety/equipment, Min assist       General transfer comment: Dependent on momentum for sit to stand; heavy mod assist to help hips anteriorly and stabilize; initially pt with posterior lean; incr time to stabilize, and cues to shift weight onto forefeet; first stand approx 30 seconds to find balance, and then pt requesting to sit back to bed; BP sitting on the high side; second time standing pt found her balance more efficiently, and was able to take pivot steps bed to recliner    Ambulation/Gait                   Stairs             Wheelchair Mobility    Modified Rankin (Stroke Patients Only)       Balance     Sitting balance-[name]: Good  Standing balance-[name]: Poor                              Cognition Arousal/Alertness: Awake/alert Behavior During Therapy: WFL for tasks assessed/performed Overall Cognitive Status: Within Functional Limits for tasks assessed                                          Exercises      General Comments General comments (skin integrity, edema, etc.):  Session conducted on room air and O2 sats ranged 89-94%; Dr. Wyline Copas aware; Needs bari BSC; is apparently on a wait list to get the bari [facility] for her room      Pertinent Vitals/Pain Pain Assessment Pain Assessment: Faces Faces Pain Scale: Hurts little more Pain Location: RLE with movement Pain Descriptors / Indicators: Grimacing Pain Intervention(s): Repositioned    Home Living                          Prior Function            PT Goals (current goals can now be found in the care plan section) Acute Rehab PT Goals Patient Stated Goal: would like to return to her independent living apt PT Goal Formulation: With patient Time For Goal Achievement: 06/30/22 Potential to Achieve Goals: Fair Progress towards PT goals: Progressing toward goals    Frequency    Min 3X/week      PT Plan Discharge plan needs to be updated    Co-evaluation              AM-PAC PT "6 Clicks" Mobility   Outcome Measure  Help needed turning from your back to your side while in a flat bed without using bedrails?: A Lot Help needed moving from lying on your back to sitting on the side of a flat bed without using bedrails?: A Lot Help needed moving to and from a bed to a chair (including a wheelchair)?: A Lot Help needed standing up from a chair using your arms (e.g., wheelchair or bedside chair)?: Total Help needed to walk in hospital room?: Total Help needed climbing 3-5 steps with a railing? : Total 6 Click Score: 9    End of Session Equipment Utilized During Treatment: Gait belt Activity Tolerance: Patient tolerated treatment well Patient left: in chair;with call bell/phone within reach;with chair alarm set Nurse Communication: Mobility status;Other (comment) (Needs a bariatric BSC; Maximove lift pad under her if needed for back to bed) PT Visit Diagnosis: Muscle weakness (generalized) (M62.81);Other abnormalities of gait and mobility (R26.89)     Time: 1021-1173 PT Time  Calculation (min) (ACUTE ONLY): 38 min  Charges:  $Therapeutic Activity: 38-52 mins                     Roney Marion, PT  Acute Rehabilitation Services Office [phone]    Colletta Maryland 06/17/2022, 2:53 PM

## 2022-06-17 NOTE — Progress Notes (Signed)
Cardiology Progress Note  Patient ID: Jody Simpson MRN: [id number] DOB: 01/20/1951 Date of Encounter: 06/17/2022  Primary Cardiologist: None  Subjective   Chief Complaint: SOB  HPI: Cough.  Reports her cough is still there.  Conduction has improved on telemetry.  Denies any chest pain.  Good diuresis.  Euvolemic on examination.  ROS:  All other ROS reviewed and negative. Pertinent positives noted in the HPI.     Inpatient Medications  Scheduled Meds:  enoxaparin (LOVENOX) injection  40 mg Subcutaneous Q24H   famotidine  20 mg Oral BID   insulin aspart  0-5 Units Subcutaneous QHS   insulin aspart  0-9 Units Subcutaneous TID WC   lisinopril  20 mg Oral Daily   montelukast  10 mg Oral QHS   oseltamivir  75 mg Oral BID   spironolactone  25 mg Oral Daily   Continuous Infusions:  cefTRIAXone (ROCEPHIN)  IV 2 g (06/17/22 0237)   PRN Meds: acetaminophen, albuterol, guaiFENesin-dextromethorphan, mouth rinse, prochlorperazine   Vital Signs   Vitals:   06/16/22 0842 06/16/22 1617 06/16/22 2034 06/17/22 0606  BP:  (!) 163/49 (!) 132/58 (!) 159/60  Pulse:  (!) 56 82 88  Resp:  '20 18 18  '$ Temp:  98.7 F (37.1 C) 98.4 F (36.9 C) 98 F (36.7 C)  TempSrc:  Oral Oral   SpO2: 97% 95% 100% 92%  Weight:      Height:        Intake/Output Summary (Last 24 hours) at 06/17/2022 0859 Last data filed at 06/17/2022 0651 Gross per 24 hour  Intake 720 ml  Output 3250 ml  Net -2530 ml      06/15/2022   12:47 AM 07/25/2016   10:20 AM 07/23/2016    1:07 PM  Last 3 Weights  Weight (lbs) 300 lb 319 lb 297 lb  Weight (kg) 136.079 kg 144.697 kg 134.718 kg      Telemetry  Overnight telemetry shows sinus rhythm heart rate in the 60s, intermittent Wenckebach, brief 2-1 block, much improved from yesterday, which I personally reviewed.   ECG  The most recent ECG shows sinus rhythm heart rate 71, first-degree AV block, Wenckebach, which I personally reviewed.   Physical Exam   Vitals:    06/16/22 0842 06/16/22 1617 06/16/22 2034 06/17/22 0606  BP:  (!) 163/49 (!) 132/58 (!) 159/60  Pulse:  (!) 56 82 88  Resp:  '20 18 18  '$ Temp:  98.7 F (37.1 C) 98.4 F (36.9 C) 98 F (36.7 C)  TempSrc:  Oral Oral   SpO2: 97% 95% 100% 92%  Weight:      Height:        Intake/Output Summary (Last 24 hours) at 06/17/2022 0859 Last data filed at 06/17/2022 0651 Gross per 24 hour  Intake 720 ml  Output 3250 ml  Net -2530 ml       06/15/2022   12:47 AM 07/25/2016   10:20 AM 07/23/2016    1:07 PM  Last 3 Weights  Weight (lbs) 300 lb 319 lb 297 lb  Weight (kg) 136.079 kg 144.697 kg 134.718 kg    Body mass index is 46.99 kg/m.  General: Well nourished, well developed, in no acute distress Head: Atraumatic, normal size  Eyes: PEERLA, EOMI  Neck: Supple, no JVD Endocrine: No thryomegaly Cardiac: Normal S1, S2; RRR; no murmurs, rubs, or gallops Lungs: Diminished breath sounds, wheezing bilaterally Abd: Soft, nontender, no hepatomegaly  Ext: Trace edema Musculoskeletal: No deformities, BUE and BLE strength  normal and equal Skin: Warm and dry, no rashes   Neuro: Alert and oriented to person, place, time, and situation, CNII-XII grossly intact, no focal deficits  Psych: Normal mood and affect   Labs  High Sensitivity Troponin:   Recent Labs  Lab 06/15/22 0055 06/15/22 0600 06/15/22 0937  TROPONINIHS 29* 108* 134*     Cardiac EnzymesNo results for input(s): "TROPONINI" in the last 168 hours. No results for input(s): "TROPIPOC" in the last 168 hours.  Chemistry Recent Labs  Lab 06/15/22 0055 06/15/22 0115 06/16/22 0454 06/17/22 0438  NA 134* 134*  134* 136 138  K 4.7 4.8  4.8 3.4* 4.1  CL 98 99 102 100  CO2 22  --  26 29  GLUCOSE 361* 347* 148* 212*  BUN '15 19 17 18  '$ CREATININE 1.33* 1.20* 1.23* 1.15*  CALCIUM 7.7*  --  7.6* 7.8*  PROT 5.9*  --  5.2* 5.5*  ALBUMIN 2.6*  --  2.1* 2.3*  AST 28  --  24 22  ALT 10  --  11 10  ALKPHOS 92  --  76 84  BILITOT 1.5*  --   0.3 0.4  GFRNONAA 43*  --  47* 51*  ANIONGAP 14  --  8 9    Hematology Recent Labs  Lab 06/15/22 0600 06/16/22 0454 06/17/22 0438  WBC 10.3 5.7 5.8  RBC 3.38* 3.10* 3.58*  HGB 10.4* 9.2* 10.8*  HCT 31.7* 30.0* 34.0*  MCV 93.8 96.8 95.0  MCH 30.8 29.7 30.2  MCHC 32.8 30.7 31.8  RDW 14.5 14.6 14.6  PLT 189 175 174   BNP Recent Labs  Lab 06/15/22 0055  BNP 677.5*    DDimer No results for input(s): "DDIMER" in the last 168 hours.   Radiology  ECHOCARDIOGRAM COMPLETE  Result Date: 06/16/2022    ECHOCARDIOGRAM REPORT   Patient Name:   Jody Simpson Dieu Date of Exam: 06/16/2022 Medical Rec #:  [id number]       Height:       67.0 in Accession #:    [id number]      Weight:       300.0 lb Date of Birth:  26-Feb-1951       BSA:          2.403 m Patient Age:    72 years        BP:           151/68 mmHg Patient Gender: F               HR:           55 bpm. Exam Location:  Inpatient Procedure: 2D Echo, Color Doppler and Cardiac Doppler Indications:    elevated troponin  History:        Patient has no prior history of Echocardiogram examinations.                 Risk Factors:Diabetes, Hypertension and Dyslipidemia.  Sonographer:    Phineas Douglas Referring Phys: [id number] [place]  1. Left ventricular ejection fraction, by estimation, is 60 to 65%. The left ventricle has normal function. The left ventricle has no regional wall motion abnormalities. There is mild left ventricular hypertrophy. Left ventricular diastolic parameters are indeterminate.  2. Right ventricular systolic function is normal. The right ventricular size is normal. There is moderately elevated pulmonary artery systolic pressure.  3. Left atrial size was moderately dilated.  4. The mitral valve is degenerative. Mild mitral valve regurgitation.  No evidence of mitral stenosis. Severe mitral annular calcification.  5. The aortic valve was not well visualized. There is moderate calcification of the aortic valve. There is  moderate thickening of the aortic valve. Aortic valve regurgitation is not visualized. Mild aortic valve stenosis.  6. The inferior vena cava is normal in size with greater than 50% respiratory variability, suggesting right atrial pressure of 3 mmHg. FINDINGS  Left Ventricle: Left ventricular ejection fraction, by estimation, is 60 to 65%. The left ventricle has normal function. The left ventricle has no regional wall motion abnormalities. The left ventricular internal cavity size was normal in size. There is  mild left ventricular hypertrophy. Left ventricular diastolic parameters are indeterminate. Right Ventricle: The right ventricular size is normal. No increase in right ventricular wall thickness. Right ventricular systolic function is normal. There is moderately elevated pulmonary artery systolic pressure. The tricuspid regurgitant velocity is 3.12 m/s, and with an assumed right atrial pressure of 15 mmHg, the estimated right ventricular systolic pressure is 76.8 mmHg. Left Atrium: Left atrial size was moderately dilated. Right Atrium: Right atrial size was normal in size. Pericardium: There is no evidence of pericardial effusion. Mitral Valve: The mitral valve is degenerative in appearance. There is severe thickening of the mitral valve leaflet(s). There is severe calcification of the mitral valve leaflet(s). Severe mitral annular calcification. Mild mitral valve regurgitation. No evidence of mitral valve stenosis. Tricuspid Valve: The tricuspid valve is normal in structure. Tricuspid valve regurgitation is mild . No evidence of tricuspid stenosis. Aortic Valve: The aortic valve was not well visualized. There is moderate calcification of the aortic valve. There is moderate thickening of the aortic valve. Aortic valve regurgitation is not visualized. Mild aortic stenosis is present. Aortic valve mean gradient measures 12.5 mmHg. Aortic valve peak gradient measures 22.0 mmHg. Aortic valve area, by VTI measures  1.66 cm. Pulmonic Valve: The pulmonic valve was normal in structure. Pulmonic valve regurgitation is trivial. No evidence of pulmonic stenosis. Aorta: The aortic root is normal in size and structure. Venous: The inferior vena cava is normal in size with greater than 50% respiratory variability, suggesting right atrial pressure of 3 mmHg. IAS/Shunts: No atrial level shunt detected by color flow Doppler.  LEFT VENTRICLE PLAX 2D LVIDd:         5.20 cm      Diastology LVIDs:         3.20 cm      LV e' medial:    8.49 cm/s LV PW:         1.30 cm      LV E/e' medial:  25.0 LV IVS:        1.20 cm      LV e' lateral:   11.00 cm/s LVOT diam:     1.90 cm      LV E/e' lateral: 19.3 LV SV:         75 LV SV Index:   31 LVOT Area:     2.84 cm  LV Volumes (MOD) LV vol d, MOD A2C: 125.0 ml LV vol d, MOD A4C: 137.0 ml LV vol s, MOD A2C: 49.2 ml LV vol s, MOD A4C: 45.9 ml LV SV MOD A2C:     75.8 ml LV SV MOD A4C:     137.0 ml LV SV MOD BP:      85.5 ml RIGHT VENTRICLE          IVC RV Basal diam:  4.40 cm  IVC diam: 2.90 cm  TAPSE (M-mode): 2.0 cm LEFT ATRIUM             Index        RIGHT ATRIUM           Index LA diam:        4.70 cm 1.96 cm/m   RA Area:     18.30 cm LA Vol (A2C):   70.4 ml 29.30 ml/m  RA Volume:   46.70 ml  19.44 ml/m LA Vol (A4C):   76.3 ml 31.76 ml/m LA Biplane Vol: 73.5 ml 30.59 ml/m  AORTIC VALVE                     PULMONIC VALVE AV Area (Vmax):    1.69 cm      PR End Diast Vel: 6.25 msec AV Area (Vmean):   1.79 cm AV Area (VTI):     1.66 cm AV Vmax:           234.75 cm/s AV Vmean:          163.500 cm/s AV VTI:            0.452 m AV Peak Grad:      22.0 mmHg AV Mean Grad:      12.5 mmHg LVOT Vmax:         140.00 cm/s LVOT Vmean:        103.000 cm/s LVOT VTI:          0.265 m LVOT/AV VTI ratio: 0.59  AORTA Ao Root diam: 3.00 cm Ao Asc diam:  3.20 cm MITRAL VALVE                TRICUSPID VALVE MV Area (PHT): 3.79 cm     TR Peak grad:   38.9 mmHg MV Decel Time: 200 msec     TR Vmax:        312.00 cm/s  MR Peak grad: 118.8 mmHg MR Mean grad: 80.0 mmHg     SHUNTS MR Vmax:      545.00 cm/s   Systemic VTI:  0.26 m MR Vmean:     431.0 cm/s    Systemic Diam: 1.90 cm MV E velocity: 212.00 cm/s Jenkins Rouge MD Electronically signed by Jenkins Rouge MD Signature Date/Time: 06/16/2022/8:47:50 AM    Final     Cardiac Studies  TTE 06/16/2022  1. Left ventricular ejection fraction, by estimation, is 60 to 65%. The  left ventricle has normal function. The left ventricle has no regional  wall motion abnormalities. There is mild left ventricular hypertrophy.  Left ventricular diastolic parameters  are indeterminate.   2. Right ventricular systolic function is normal. The right ventricular  size is normal. There is moderately elevated pulmonary artery systolic  pressure.   3. Left atrial size was moderately dilated.   4. The mitral valve is degenerative. Mild mitral valve regurgitation. No  evidence of mitral stenosis. Severe mitral annular calcification.   5. The aortic valve was not well visualized. There is moderate  calcification of the aortic valve. There is moderate thickening of the  aortic valve. Aortic valve regurgitation is not visualized. Mild aortic  valve stenosis.   6. The inferior vena cava is normal in size with greater than 50%  respiratory variability, suggesting right atrial pressure of 3 mmHg.   Patient Profile  Jody Simpson is a 72 y.o. female with diabetes, morbid obesity, hypertension who was admitted on 06/15/2022 for acute hypoxic respiratory failure secondary to influenza pneumonia.  Cardiology consulted for elevated troponin and volume overload.   Assessment & Plan   # Acute hypoxic respiratory failure secondary to influenza # Acute heart failure with preserved ejection fraction -EF 60 to 65%.  Given Lasix yesterday.  Good urine output.  No further IV diuresis. -Continue Aldactone 25 mg daily.  Add back lisinopril 20 mg daily.  Would recommend Lasix 20 mg daily as needed for  lower extremity edema.  # Intermittent left bundle branch block # 2-1 block, high-grade AV conduction disease, transient # Wenckebach # First-degree AV block -Conduction has improved with holding atenolol.  Discussed her case with EP.  They would like to pursue outpatient monitor.  We will arrange this.  Would recommend to continue holding all AV nodal agents.  Her conduction has improved dramatically over the past 24 hours.  She describes no dizziness or lightheadedness.  We will continue to monitor as an outpatient for ongoing pacemaker needs.  However currently she has no need for pacemaker therapy.  # Elevated troponin, demand ischemia -Secondary to acute HFpEF.  Also had mild rhabdomyolysis.  No chest pain.  Echo with normal LV function no wall motion abnormality.  EKG is nonischemic.  This is not an acute coronary syndrome.  This does not need further workup. -She has allergies to aspirin.  Would hold on Plavix. -Also has considerable statin allergies.  LDL 78 which is close to goal. -We will discuss this further in the outpatient setting.  # Sepsis # Influenza # Rhabdomyolysis # Mechanical fall -Per primary team  [place] will sign off.   Medication Recommendations: Medications as above.  Hold atenolol at discharge. Other recommendations (labs, testing, etc): 7-day ZIO at the time of discharge.  We will arrange this. Follow up as an outpatient: We will arrange follow-up with me in 4 to 6 weeks  For questions or updates, please contact [place] Please consult [email or web address] for contact info under        Signed, [place] T. Audie Box, MD, [place]  06/17/2022 8:59 AM

## 2022-06-17 NOTE — Progress Notes (Signed)
Progress Note   Patient: Jody Simpson KYH:[id number] DOB: [birth year] DOA: 06/15/2022     2 DOS: the patient was seen and examined on 06/17/2022   Brief hospital course: 72 y.o. female with medical history significant for SVT, essential hypertension, hyperlipidemia, GERD, type 2 diabetes, who presented to [facility] ED from independent living facility via EMS due to generalized weakness.  Endorses feeling so weak that she slid out of her chair and ended up on the floor.  States she was diagnosed with the flu on Friday.  At the facility, they could not get her up per their policy.  Reportedly, per ILF staff, patient was unresponsive on the floor.  EMS was called.  Upon EMS assessment vital signs notable for soft BPs and bradycardia with heart rate of 50.   In the ED, febrile with Tmax 101.2.  influenza A positive.  UA positive for pyuria.  Due to concern for sepsis with leukocytosis, urine culture and blood cultures were obtained.  She was started on Rocephin empirically.  Tamiflu was added.  Assessment and Plan: Sepsis secondary to UTI, POA Presented with fever with Tmax 101.2, leukocytosis 12.5. Blood cx neg thus far Urine cx pos for enterococcus and ecoli, both sensitive to rocephin, cont for now Pt reports feeling better today Recheck bmet and cbc in AM   Generalized weakness, suspect multifactorial Treat underlying conditions PT OT consulted, thus far, recs for SNF Fall precautions   Elevated troponin with LBBB, unclear if new Denies any anginal symptoms -Trop peaked to just over 100 Cardiology was consulted, suspected demand ischemia 2d echo with EF 60%, no WMA Cardiology recs to hold beta blockers/AV nodal agents.  -Pt to continue to follow this as outpt   Influenza A viral infection Tamiflu 75 mg twice daily x 5 days DuoNebs every 6 hours   Acute hypoxic respiratory failure secondary to influenza A viral infection O2 saturation in the 80s at the independent living  facility Requiring 3 L to maintain O2 saturation greater than 92%. Incentive spirometer, bronchodilators.   AKI, suspect prerenal in the setting of dehydration Baseline creatinine 1.0 Presented with creatinine 1.3.  GFR 43. Avoid nephrotoxic agents and hypotension. Cr down to 1.15 today Recheck bmet in AM   Type 2 diabetes with hyperglycemia A1c 8.2 Cont SSI as needed   Possible syncope Reportedly, per ILF staff patient was unresponsive on the floor. Follow 2D echo performed Closely monitor on telemetry Fall precautions   Cardiomegaly, with acute heart failure with preserved EF, new diagnosis BNP 677, trop peaked to 100 Presented hypervolemic Improved with Lasix '40mg'$  IV BID ordered by Cardiology Cardiology recs now for continued aldactone '25mg'$ , lisinopril '20mg'$ , lasix '20mg'$  daily PRN edema Cardiology has since signed off 1/16   Elevated CPK CPK 1000 Given IVF in ED   Isolated hyperbilirubinemia AST ALT and alkaline phosphatase normal. T. bili 1.5 at presentation, now resolved   Hypomagnesemia -replaced  Hypokalemia -normalized     Subjective: Reports feeling better today  Physical Exam: Vitals:   06/16/22 2034 06/17/22 0606 06/17/22 0907 06/17/22 1625  BP: (!) 132/58 (!) 159/60 (!) 148/66 (!) 141/58  Pulse: 82 88 82 80  Resp: '18 18 18 18  '$ Temp: 98.4 F (36.9 C) 98 F (36.7 C) 98 F (36.7 C) 98.2 F (36.8 C)  TempSrc: Oral  Oral Oral  SpO2: 100% 92% 98% 97%  Weight:      Height:       General exam: Awake, laying in bed, in nad  Respiratory system: Normal respiratory effort, no wheezing Cardiovascular system: regular rate, s1, s2 Gastrointestinal system: Soft, nondistended, positive BS Central nervous system: CN2-12 grossly intact, strength intact Extremities: Perfused, no clubbing Skin: Normal skin turgor, no notable skin lesions seen Psychiatry: Mood normal // no visual hallucinations   Data Reviewed:  Labs reviewed: Na 138, K 4.1, Cr 1.15, WBC  5.8  Family Communication: Pt in room, family not at bedside  Disposition: Status is: Inpatient Remains inpatient appropriate because: Severity of illness  Planned Discharge Destination: Skilled nursing facility     Author: Marylu Lund, MD 06/17/2022 5:01 PM  For on call review [email or web address].

## 2022-06-17 NOTE — TOC Progression Note (Signed)
Transition of Care [facility]) - Initial/Assessment Note    Patient Details  Name: Jody Simpson MRN: [id number] Date of Birth: [birth year]  Transition of Care [facility]) CM/SW Contact:    Milinda Antis, LCSWA Phone Number: 06/17/2022, 3:24 PM  Clinical Narrative:                 LCSW contacted the [place], the independent living facility where the patient resides.  The facility does not have an ALF or SNF.  The patient would need to go elsewhere to receive this services, but can return when back to baseline.  TOC following.         Patient Goals and CMS Choice            Expected Discharge Plan and Services                                              Prior Living Arrangements/Services                       Activities of Daily Living Home Assistive Devices/Equipment: Gilford Rile (specify type) ADL Screening (condition at time of admission) Patient's cognitive ability adequate to safely complete daily activities?: Yes Is the patient deaf or have difficulty hearing?: No Does the patient have difficulty seeing, even when wearing glasses/contacts?: No Does the patient have difficulty concentrating, remembering, or making decisions?: No Patient able to express need for assistance with ADLs?: Yes Does the patient have difficulty dressing or bathing?: No Independently performs ADLs?: Yes (appropriate for developmental age) Does the patient have difficulty walking or climbing stairs?: Yes Weakness of Legs: Both Weakness of Arms/Hands: None  Permission Sought/Granted                  Emotional Assessment              Admission diagnosis:  Hypoxia [R09.02] Acute UTI [N39.0] AKI (acute kidney injury) ([place]) [N17.9] Sepsis secondary to UTI ([place]) [A41.9, N39.0] Influenza [J11.1] Sepsis, due to unspecified organism, unspecified whether acute organ dysfunction present [facility]) [A41.9] Patient Active Problem List   Diagnosis Date Noted   Sepsis secondary  to UTI ([place]) 06/15/2022   CAP (community acquired pneumonia) 07/03/2016   Obesity-BMI 51 07/03/2016   Fall-down at home x 48 hrs 07/03/2016   PAT (paroxysmal atrial tachycardia) 07/03/2016   COLONIC POLYPS 09/08/2007   Gout 09/08/2007   GASTROESOPHAGEAL REFLUX DISEASE 09/08/2007   IRRITABLE BOWEL SYNDROME 09/08/2007   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[place]) 07/28/2006   Hyperlipemia 07/28/2006   SOLITARY KIDNEY 07/28/2006   Diabetes mellitus, type 2 ([place]) 07/21/2006   Essential hypertension 07/21/2006   DERMATITIS, ATOPIC 07/21/2006   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   PCP:  Jinny Blossom, [facility]:   [id number] [place], [place] - [address] AT [facility] OF [address] [place] [address] Phone: [phone] Fax: [phone]  [facility] [place], [place] - [address] AT [facility] OF [address] [place] [address] Phone: [phone] Fax: [phone]  [facility] - [place], [place] [address] [place] [address] Phone: [phone] Fax: [phone]     Social Determinants of Health (SDOH) Social History: SDOH Screenings   Food Insecurity: No  Food Insecurity (06/15/2022)  Housing: Low Risk  (06/15/2022)  Transportation Needs: No Transportation Needs (06/15/2022)  Utilities: Not At Risk (06/15/2022)  Tobacco Use: Low Risk  (06/15/2022)   SDOH Interventions:     Readmission Risk Interventions     No data to display

## 2022-06-17 NOTE — Progress Notes (Signed)
Zio monitor ordered to further assess AV conduction disease. Dr. Audie Box to read.

## 2022-06-18 DIAGNOSIS — N390 Urinary tract infection, site not specified: Secondary | ICD-10-CM | POA: Diagnosis not present

## 2022-06-18 DIAGNOSIS — A419 Sepsis, unspecified organism: Secondary | ICD-10-CM | POA: Diagnosis not present

## 2022-06-18 LAB — GLUCOSE, CAPILLARY
Glucose-Capillary: 198 mg/dL — ABNORMAL HIGH (ref 70–99)
Glucose-Capillary: 187 mg/dL — ABNORMAL HIGH (ref 70–99)
Glucose-Capillary: 239 mg/dL — ABNORMAL HIGH (ref 70–99)
Glucose-Capillary: 248 mg/dL — ABNORMAL HIGH (ref 70–99)

## 2022-06-18 LAB — COMPREHENSIVE METABOLIC PANEL
ALT: 14 U/L (ref 0–44)
AST: 21 U/L (ref 15–41)
Albumin: 2.4 g/dL — ABNORMAL LOW (ref 3.5–5.0)
Alkaline Phosphatase: 88 U/L (ref 38–126)
Anion gap: 11 (ref 5–15)
BUN: 11 mg/dL (ref 8–23)
CO2: 28 mmol/L (ref 22–32)
Calcium: 8.1 mg/dL — ABNORMAL LOW (ref 8.9–10.3)
Chloride: 100 mmol/L (ref 98–111)
Creatinine, Ser: 0.95 mg/dL (ref 0.44–1.00)
GFR, Estimated: >60 mL/min (ref >60)
Glucose, Bld: 198 mg/dL — ABNORMAL HIGH (ref 70–99)
Potassium: 3.7 mmol/L (ref 3.5–5.1)
Sodium: 139 mmol/L (ref 135–145)
Total Bilirubin: 0.6 mg/dL (ref 0.3–1.2)
Total Protein: 6.1 g/dL — ABNORMAL LOW (ref 6.5–8.1)

## 2022-06-18 LAB — CBC
HCT: 37.7 % (ref 36.0–46.0)
Hemoglobin: 11.6 g/dL — ABNORMAL LOW (ref 12.0–15.0)
MCH: 29.1 pg (ref 26.0–34.0)
MCHC: 30.8 g/dL (ref 30.0–36.0)
MCV: 94.7 fL (ref 80.0–100.0)
Platelets: 193 10*3/uL (ref 150–400)
RBC: 3.98 MIL/uL (ref 3.87–5.11)
RDW: 14.1 % (ref 11.5–15.5)
WBC: 7.1 10*3/uL (ref 4.0–10.5)
nRBC: 0 % (ref 0.0–0.2)

## 2022-06-18 MED ORDER — BENZONATATE 100 MG PO CAPS
100.0000 mg | ORAL_CAPSULE | Freq: Three times a day (TID) | ORAL | Status: DC | PRN
  Administered 2022-06-18 – 2022-06-23 (×7): 100 mg via ORAL
  Filled 2022-06-18 (×7): qty 1

## 2022-06-18 MED ORDER — GUAIFENESIN ER 600 MG PO TB12
600.0000 mg | ORAL_TABLET | Freq: Two times a day (BID) | ORAL | Status: DC
  Administered 2022-06-18 – 2022-06-26 (×17): 600 mg via ORAL
  Filled 2022-06-18 (×17): qty 1

## 2022-06-18 MED ORDER — HYDRALAZINE HCL 25 MG PO TABS
25.0000 mg | ORAL_TABLET | Freq: Four times a day (QID) | ORAL | Status: DC | PRN
  Administered 2022-06-18 – 2022-06-22 (×4): 25 mg via ORAL
  Filled 2022-06-18 (×4): qty 1

## 2022-06-18 NOTE — TOC Progression Note (Signed)
Transition of Care [facility]) - Progression Note    Patient Details  Name: Jody Simpson MRN: [id number] Date of Birth: March 19, 1951  [facility] of [facility]) CM/SW Contact  Tom-Johnson, Renea Ee, RN Phone Number: 06/18/2022, 4:11 PM  Clinical Narrative:     CM spoke with patient at bedside about home health needs as patient declined SNF. Patient was active with [facility] before and requests to resume their services at discharge. CM called in resumption of care referral to [facility] and acceptance noted, info on AVS. Patient to return to her [place] at [employer] at discharge. Family will transport at discharge. CM will continue to follow.      Expected Discharge Plan: [place] Barriers to Discharge: Continued Medical Work up  Expected Discharge Plan and Services   Discharge Planning Services: CM Consult Post Acute Care Choice: [place] arrangements for the past 2 months: [place]                 DME Arranged: N/A DME Agency: NA       HH Arranged: PT, OT HH Agency: [place] Date [place]: 06/16/22 Time [place]: [id number] Representative spoke with at [place]: [place] Determinants of Health ([place]) Interventions SDOH Screenings   Food Insecurity: No Food Insecurity (06/15/2022)  Housing: Low Risk  (06/15/2022)  Transportation Needs: No Transportation Needs (06/15/2022)  Utilities: Not At Risk (06/15/2022)  Tobacco Use: Low Risk  (06/15/2022)    Readmission Risk Interventions     No data to display

## 2022-06-18 NOTE — Progress Notes (Signed)
Occupational Therapy Treatment Patient Details Name: Jody Simpson MRN: [id number] DOB: 10-Jul-1950 Today's Date: 06/18/2022   History of present illness Jody Simpson is a 72 y.o. female with medical history significant for SVT, essential hypertension, hyperlipidemia, GERD, type 2 diabetes, who presented to [facility] ED from independent living facility via EMS due to generalized weakness. She slid out of her chair. Admitted for sepsis secondary to UTI and + flu.   OT comments  Patient making good gains with patient able to get to EOB with min assist from mod assist. Patient attempted to stand with one assist from EOB to rollator but was worried of feet sliding due to no shoes and wearing non-skid socks. Mobility specialist assist with sit to stand to allow for feet to be blocked and patient was able to stand and tolerate peri area back cleaning before sitting on EOB and transferred to recliner following a short rest with assistance of 2 with rollator for safety and patient comfort. Patient continues to have difficulty with sit to stand and will continue to require help at home at this time. Patient to continue to be followed by acute OT.    Recommendations for follow up therapy are one component of a multi-disciplinary discharge planning process, led by the attending physician.  Recommendations may be updated based on patient status, additional functional criteria and insurance authorization.    Follow Up Recommendations  Home health OT     Assistance Recommended at Discharge Intermittent Supervision/Assistance  Patient can return home with the following  A little help with walking and/or transfers;A little help with bathing/dressing/bathroom;Assistance with cooking/housework   Equipment Recommendations  None recommended by OT    Recommendations for Other Services      Precautions / Restrictions Precautions Precautions: Fall Precaution Comments: watch O2 sats Restrictions Weight Bearing  Restrictions: No       Mobility Bed Mobility Overal bed mobility: Needs Assistance Bed Mobility: Supine to Sit     Supine to sit: Min assist     General bed mobility comments: increased time and min assist for RLE    Transfers Overall transfer level: Needs assistance Equipment used: Rollator (4 wheels) Transfers: Sit to/from Stand, Bed to chair/wheelchair/BSC Sit to Stand: Min assist, +2 physical assistance     Step pivot transfers: +2 physical assistance, +2 safety/equipment, Min assist     General transfer comment: patient uses momentum to power up from EOB, cues for hand placement     Balance Overall balance assessment: Needs assistance Sitting-balance support: No upper extremity supported, Feet supported Sitting balance-[name]: Good     Standing balance support: During functional activity, Reliant on assistive device for balance Standing balance-[name]: Poor Standing balance comment: reliant on rollator for balance                           ADL either performed or assessed with clinical judgement   ADL Overall ADL's : Needs assistance/impaired     Grooming: Wash/dry hands;Wash/dry face;Oral care;Brushing hair;Set up;Sitting                   Toilet Transfer: Minimal assistance;+2 for physical assistance Toilet Transfer Details (indicate cue type and reason): attempted to stand with +1 assist and patient was cautious of feet sliding. Another person assisted to allow for feet to be blocked; simulated to recliner Toileting- Clothing Manipulation and Hygiene: Maximal assistance;Sit to/from stand  Extremity/Trunk Assessment              Vision       Perception     Praxis      Cognition Arousal/Alertness: Awake/alert Behavior During Therapy: WFL for tasks assessed/performed Overall Cognitive Status: Within Functional Limits for tasks assessed                                           Exercises      Shoulder Instructions       General Comments      Pertinent Vitals/ Pain       Pain Assessment Pain Assessment: Faces Faces Pain Scale: Hurts little more Pain Location: RLE with movement Pain Descriptors / Indicators: Grimacing, Guarding Pain Intervention(s): Limited activity within patient's tolerance, Monitored during session, Repositioned  Home Living                                          Prior Functioning/Environment              Frequency  Min 2X/week        Progress Toward Goals  OT Goals(current goals can now be found in the care plan section)  Progress towards OT goals: Progressing toward goals  Acute Rehab OT Goals Patient Stated Goal: go home OT Goal Formulation: With patient Time For Goal Achievement: 06/29/22 Potential to Achieve Goals: Good ADL Goals Pt Will Perform Lower Body Dressing: with supervision;sit to/from stand Pt Will Transfer to Toilet: with supervision;ambulating;regular height toilet;grab bars;bedside commode Pt Will Perform Toileting - Clothing Manipulation and hygiene: with supervision;sit to/from stand  Plan Discharge plan remains appropriate    Co-evaluation                 AM-PAC OT "6 Clicks" Daily Activity     Outcome Measure   Help from another person eating meals?: None Help from another person taking care of personal grooming?: A Little Help from another person toileting, which includes using toliet, bedpan, or urinal?: A Lot Help from another person bathing (including washing, rinsing, drying)?: A Lot Help from another person to put on and taking off regular upper body clothing?: A Little Help from another person to put on and taking off regular lower body clothing?: A Lot 6 Click Score: 16    End of Session Equipment Utilized During Treatment: Rollator (4 wheels)  OT Visit Diagnosis: Muscle weakness (generalized) (M62.81)   Activity Tolerance Patient tolerated  treatment well   Patient Left in chair;with call bell/phone within reach;with chair alarm set   Nurse Communication Mobility status        Time: 1028-1100 OT Time Calculation (min): 32 min  Charges: OT General Charges $OT Visit: 1 Visit OT Treatments $Self Care/Home Management : 8-22 mins $Therapeutic Activity: 8-22 mins  Lodema Hong, [place]  Office [phone]   Trixie Dredge 06/18/2022, 12:49 PM

## 2022-06-18 NOTE — Progress Notes (Signed)
   06/18/22 1808  Assess: MEWS Score  Temp 98 F (36.7 C)  BP (!) 206/88 (MD notfied)  Pulse Rate (!) 105  Resp (!) 26  SpO2 93 %  Assess: MEWS Score  MEWS Temp 0  MEWS Systolic 2  MEWS Pulse 1  MEWS RR 2  MEWS LOC 0  MEWS Score 5  MEWS Score Color Red  Assess: if the MEWS score is Yellow or Red  Were vital signs taken at a resting state? Yes  Focused Assessment Change from prior assessment (see assessment flowsheet)  Does the patient meet 2 or more of the SIRS criteria? Yes  Does the patient have a confirmed or suspected source of infection? Yes  Provider and Rapid Response Notified? Yes (provider paged)  MEWS guidelines implemented *See Row Information* Yes  Treat  MEWS Interventions Escalated (See documentation below)  Take Vital Signs  Increase Vital Sign Frequency  Red: Q 1hr X 4 then Q 4hr X 4, if remains red, continue Q 4hrs  Escalate  MEWS: Escalate Red: discuss with charge nurse/RN and provider, consider discussing with RRT  Notify: Charge Nurse/RN  Name of Charge Nurse/RN Notified Darylene Price, RN  Date Charge Nurse/RN Notified 06/18/22  Time Charge Nurse/RN Notified 1811  Provider Notification  Provider Name/Title Eric [place] DO  Date Provider Notified 06/18/22  Time Provider Notified 1812  Method of Notification Page  Notification Reason Change in status  Provider response See new orders  Date of Provider Response 06/18/22  Time of Provider Response 1812  Document  Patient Outcome Stabilized after interventions  Progress note created (see row info) Yes  Assess: SIRS CRITERIA  SIRS Temperature  0  SIRS Pulse 1  SIRS Respirations  1  SIRS WBC 0  SIRS Score Sum  2

## 2022-06-18 NOTE — Progress Notes (Signed)
Mobility Specialist Progress Note:   06/18/22 1520  Mobility  Activity Transferred from chair to bed  Level of Assistance Minimal assist, patient does 75% or more (+2)  Assistive Device Four wheel walker  Distance Ambulated (ft) 2 ft  Activity Response Tolerated well  Mobility Referral Yes  $Mobility charge 1 Mobility   Pt requesting to transfer back to bed from chair. Required minA+2 to power up with heavy reliance on momentum. Pt displayed anxiety throughout session, left sitting EOB with RN in room to pass meds.   Nelta Numbers Mobility Specialist Please contact via SecureChat or  Rehab office at [phone]

## 2022-06-18 NOTE — Progress Notes (Signed)
PROGRESS NOTE    Jody Simpson  BDZ:[id number] DOB: 1950-07-25 DOA: 06/15/2022 PCP: Jinny Blossom, [facility]    Brief Narrative:   Jody Simpson is a 72 y.o. female with past medical history significant for HTN, HLD, SVT, DM2, GERD who presented to [facility] ED on 1/14 from independent living facility via EMS due to generalized weakness.  Patient endorsed feeling so weak that she slid out of her chair and ended up on the floor.  Reports was diagnosed with influenza Friday prior to admission.  At the facility, they cannot get her up per their policy.  Reportedly, per ILF staff patient was unresponsive on the floor and EMS was called.  On EMS arrival, vital signs notable for soft BPs, bradycardia with heart rate of 50.  In the ED, due to concern for sepsis with leukocytosis, urine culture and blood cultures obtained.  Patient was started on Rocephin empirically.  Tamiflu was started.  TRH consulted for admission for further evaluation and management.  Assessment & Plan:   Sepsis, POA Enterococcus/E. coli UTI Patient presenting to the ED with fever, temperature 101.2 F, leukocytosis 12.5.  Urinalysis consistent with UTI.  Urine culture positive for Enterococcus and E. coli.  Leukocytosis now resolved.  -- Ceftriaxone 2 g IV every 24 hours  Generalized weakness, deconditioning, debility Seen by PT and OT with recommendations of SNF placement.  Patient declines and wishes to return back to her ILF with home health therapy. --Continue therapy efforts while inpatient  Influenza A viral infection -- Continue Tamiflu 75 mg p.o. 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  Case was discussed with electrophysiology who recommended outpatient monitoring and follow-up.  Plan for 7-day Zio patch at time of discharge with outpatient follow-up with Dr. Davina Poke 4-6 weeks. -- Avoid  all AV nodal blocking agents  Acute on chronic diastolic congestive heart failure exacerbation Essential hypertension Cardiology was consulted and followed during hospital course.  Patient was supported with IV furosemide.  TTE with LVEF 60 to 65%.  Cardiology recommends Aldactone 25 mg p.o. daily, lisinopril 20 mg p.o. daily, Lasix 20 mg p.o. daily as needed for lower extremity edema.  Outpatient follow-up with cardiology.  Elevated troponin Etiology likely secondary to acute diastolic congestive heart failure exacerbation also with mild rhabdomyolysis.  Denies chest pain.  Echo with normal LV function with no wall motion abnormality.  EKG nonspecific.  LDL 78, close to goal.  Not consistent with acute coronary syndrome.  Cardiology was consulted with no need for further workup at this time.  Patient has allergies to statin and aspirin.  Plan outpatient follow-up with cardiology.  Acute renal failure Patient presenting with a creatinine of 1.3, baseline 1.0.  Etiology likely secondary to prerenal azotemia in setting of dehydration.  Supported with IV fluid hydration with improvement of creatinine to 0.95. --Continue to encourage increased oral intake  Type 2 diabetes mellitus with hyperglycemia He will go A1c 8.2, poorly controlled.  On glipizide 10 mg p.o. twice daily, Soliqua 28u White daily and insulin aspart 20 units twice daily before a meal at home. -- Holding home regimen while inpatient -- SSI for coverage -- CBGs qAC/HS  Questionable syncope Per ILF staff, patient was not responsive on the floor.  TTE with LVEF 60 to 65%, no regional wall motion normalities, mild LVH, mild aortic valve stenosis, IVC normal in size. -- Fall precautions  Elevated CPK CPK elevated 1000 on arrival.  Supported with IV fluid hydration.  Isolated hyperbilirubinemia AST, ALT and alkaline phosphatase normal.  Total bilirubin 1.5 at presentation, now resolved following IV fluid  hydration.  Hypomagnesemia Hypokalemia Repleted.  Morbid obesity Body mass index is 46.99 kg/m.  Discussed with patient needs for aggressive lifestyle changes/weight loss as this complicates all facets of care.  Outpatient follow-up with PCP.    DVT prophylaxis: enoxaparin (LOVENOX) injection 40 mg Start: 06/15/22 1000    Code Status: Full Code Family Communication: No family present at bedside this morning  Disposition Plan:  Level of care: Telemetry Medical Status is: Inpatient Remains inpatient appropriate because: Anticipate discharge home tomorrow with home health as patient refuses SNF placement    Consultants:  Cardiology  Procedures:  TTE  Antimicrobials:  Ceftriaxone 1/13>>   Subjective: Patient seen at bedside, resting comfortably.  Lying in bed.  No specific complaints this morning.  Discussed recommendations from therapy for SNF placement, patient declines and wishes to return back to her independent living facility with home health.  No other specific complaints or concerns at this time.  Denies headache, no dizziness, no chest pain, no palpitations, no shortness of breath, no abdominal pain, no focal weakness, no fatigue, no fever/chills/night sweats, no nausea/vomiting/diarrhea, no paresthesias.  No acute events overnight per nurse.  Discussed anticipated discharge home with home health tomorrow.  Objective: Vitals:   06/17/22 1625 06/17/22 2124 06/18/22 0552 06/18/22 0802  BP: (!) 141/58 (!) 157/70 (!) 148/72 (!) 166/70  Pulse: 80 94 80 96  Resp: '18 18 16 20  '$ Temp: 98.2 F (36.8 C) 98.1 F (36.7 C) 98.5 F (36.9 C) 98.7 F (37.1 C)  TempSrc: Oral Oral Oral Oral  SpO2: 97% (!) 89% 90% 94%  Weight:      Height:        Intake/Output Summary (Last 24 hours) at 06/18/2022 1357 Last data filed at 06/18/2022 0946 Gross per 24 hour  Intake 360 ml  Output 1350 ml  Net -990 ml   Filed Weights   06/15/22 0047  Weight: 136.1 kg     Examination:  Physical Exam: GEN: NAD, alert and oriented x 3, obese HEENT: NCAT, PERRL, EOMI, sclera clear, MMM PULM: CTAB w/o wheezes/crackles, normal respiratory effort CV: RRR w/o M/G/R GI: abd soft, NTND, NABS, no R/G/M MSK: no peripheral edema, muscle strength globally intact 5/5 bilateral upper/lower extremities NEURO: CN II-XII intact, no focal deficits, sensation to light touch intact PSYCH: normal mood/affect Integumentary: dry/intact, no rashes or wounds    Data Reviewed: I have personally reviewed following labs and imaging studies  CBC: Recent Labs  Lab 06/15/22 0055 06/15/22 0115 06/15/22 0600 06/16/22 0454 06/17/22 0438 06/18/22 0914  WBC 12.5*  --  10.3 5.7 5.8 7.1  NEUTROABS 11.4*  --   --   --   --   --   HGB 11.1* 12.2  11.9* 10.4* 9.2* 10.8* 11.6*  HCT 35.8* 36.0  35.0* 31.7* 30.0* 34.0* 37.7  MCV 96.2  --  93.8 96.8 95.0 94.7  PLT 213  --  189 175 174 638   Basic Metabolic Panel: Recent Labs  Lab 06/15/22 0055 06/15/22 0115 06/15/22 0600 06/16/22 0454 06/17/22 0438 06/18/22 0914  NA 134* 134*  134*  --  136 138 139  K 4.7 4.8  4.8  --  3.4* 4.1 3.7  CL 98 99  --  102 100 100  CO2 22  --   --  $'26 29 28  'D$ GLUCOSE 361* 347*  --  148* 212* 198*  BUN 15 19  --  '17 18 11  '$ CREATININE 1.33* 1.20*  --  1.23* 1.15* 0.95  CALCIUM 7.7*  --   --  7.6* 7.8* 8.1*  MG  --   --  1.6* 1.9  --   --   PHOS  --   --  4.6  --   --   --    GFR: Estimated Creatinine Clearance: 79.5 mL/min (by C-G formula based on SCr of 0.95 mg/dL). Liver Function Tests: Recent Labs  Lab 06/15/22 0055 06/16/22 0454 06/17/22 0438 06/18/22 0914  AST '28 24 22 21  '$ ALT '10 11 10 14  '$ ALKPHOS 92 76 84 88  BILITOT 1.5* 0.3 0.4 0.6  PROT 5.9* 5.2* 5.5* 6.1*  ALBUMIN 2.6* 2.1* 2.3* 2.4*   No results for input(s): "LIPASE", "AMYLASE" in the last 168 hours. No results for input(s): "AMMONIA" in the last 168 hours. Coagulation Profile: Recent Labs  Lab 06/15/22 0055   INR 1.1   Cardiac Enzymes: Recent Labs  Lab 06/15/22 0055  CKTOTAL 1,000*   BNP (last 3 results) No results for input(s): "PROBNP" in the last 8760 hours. HbA1C: No results for input(s): "HGBA1C" in the last 72 hours. CBG: Recent Labs  Lab 06/17/22 1121 06/17/22 1658 06/17/22 2124 06/18/22 0807 06/18/22 1127  GLUCAP 155* 206* 247* 198* 187*   Lipid Profile: Recent Labs    06/17/22 0438  CHOL 140  HDL 29*  LDLCALC 78  TRIG 164*  CHOLHDL 4.8   Thyroid Function Tests: No results for input(s): "TSH", "T4TOTAL", "FREET4", "T3FREE", "THYROIDAB" in the last 72 hours. Anemia Panel: No results for input(s): "VITAMINB12", "FOLATE", "FERRITIN", "TIBC", "IRON", "RETICCTPCT" in the last 72 hours. Sepsis Labs: Recent Labs  Lab 06/15/22 0054 06/15/22 0600  LATICACIDVEN 3.3* 1.7    Recent Results (from the past 240 hour(s))  Blood Culture (routine x 2)     Status: None (Preliminary result)   Collection Time: 06/15/22  1:30 AM   Specimen: BLOOD  Result Value Ref Range Status   Specimen Description BLOOD LEFT ANTECUBITAL  Final   Special Requests   Final    BOTTLES DRAWN AEROBIC AND ANAEROBIC Blood Culture results may not be optimal due to an excessive volume of blood received in culture bottles   Culture   Final    NO GROWTH 2 DAYS Performed at [facility] [address], [place], [place] [address]    Report Status PENDING  Incomplete  Blood Culture (routine x 2)     Status: None (Preliminary result)   Collection Time: 06/15/22  1:30 AM   Specimen: BLOOD  Result Value Ref Range Status   Specimen Description BLOOD RIGHT ANTECUBITAL  Final   Special Requests   Final    BOTTLES DRAWN AEROBIC AND ANAEROBIC Blood Culture results may not be optimal due to an excessive volume of blood received in culture bottles   Culture   Final    NO GROWTH 2 DAYS Performed at [facility], [place] [address], [place], [place] [address]    Report Status PENDING  Incomplete   Resp panel by RT-PCR (RSV, Flu A&B, Covid) Anterior Nasal Swab     Status: Abnormal   Collection Time: 06/15/22  1:30 AM   Specimen: Anterior Nasal Swab  Result Value Ref Range Status   SARS Coronavirus 2 by RT PCR NEGATIVE NEGATIVE Final    Comment: (  NOTE) SARS-CoV-2 target nucleic acids are NOT DETECTED.  The SARS-CoV-2 RNA is generally detectable in upper respiratory specimens during the acute phase of infection. The lowest concentration of SARS-CoV-2 viral copies this assay can detect is 138 copies/mL. A negative result does not preclude SARS-Cov-2 infection and should not be used as the sole basis for treatment or other patient management decisions. A negative result may occur with  improper specimen collection/handling, submission of specimen other than nasopharyngeal swab, presence of viral mutation(s) within the areas targeted by this assay, and inadequate number of viral copies(<138 copies/mL). A negative result must be combined with clinical observations, patient history, and epidemiological information. The expected result is Negative.  Fact Sheet for Patients:  EntrepreneurPulse.com.au  Fact Sheet for Healthcare Providers:  IncredibleEmployment.be  This test is no t yet approved or cleared by the [place] FDA and  has been authorized for detection and/or diagnosis of SARS-CoV-2 by FDA under an Emergency Use Authorization (EUA). This EUA will remain  in effect (meaning this test can be used) for the duration of the COVID-19 declaration under Section 564(b)(1) of the Act, 21 U.S.C.section 360bbb-3(b)(1), unless the authorization is terminated  or revoked sooner.       Influenza A by PCR POSITIVE (A) NEGATIVE Final   Influenza B by PCR NEGATIVE NEGATIVE Final    Comment: (NOTE) The Xpert Xpress SARS-CoV-2/FLU/RSV plus assay is intended as an aid in the diagnosis of influenza from Nasopharyngeal swab specimens and should not  be used as a sole basis for treatment. Nasal washings and aspirates are unacceptable for Xpert Xpress SARS-CoV-2/FLU/RSV testing.  Fact Sheet for Patients: EntrepreneurPulse.com.au  Fact Sheet for Healthcare Providers: IncredibleEmployment.be  This test is not yet approved or cleared by the [place] FDA and has been authorized for detection and/or diagnosis of SARS-CoV-2 by FDA under an Emergency Use Authorization (EUA). This EUA will remain in effect (meaning this test can be used) for the duration of the COVID-19 declaration under Section 564(b)(1) of the Act, 21 U.S.C. section 360bbb-3(b)(1), unless the authorization is terminated or revoked.     Resp Syncytial Virus by PCR NEGATIVE NEGATIVE Final    Comment: (NOTE) Fact Sheet for Patients: EntrepreneurPulse.com.au  Fact Sheet for Healthcare Providers: IncredibleEmployment.be  This test is not yet approved or cleared by the [place] FDA and has been authorized for detection and/or diagnosis of SARS-CoV-2 by FDA under an Emergency Use Authorization (EUA). This EUA will remain in effect (meaning this test can be used) for the duration of the COVID-19 declaration under Section 564(b)(1) of the Act, 21 U.S.C. section 360bbb-3(b)(1), unless the authorization is terminated or revoked.  Performed at [facility], [place] [address], [place], [place] [address]   Urine Culture     Status: Abnormal   Collection Time: 06/15/22  2:42 AM   Specimen: Urine, Catheterized  Result Value Ref Range Status   Specimen Description URINE, CATHETERIZED  Final   Special Requests   Final    NONE Performed at [facility], [place] [address], [place], [place] [address]    Culture (A)  Final    >=100,000 COLONIES/mL ENTEROBACTER AEROGENES 80,000 COLONIES/mL ESCHERICHIA COLI    Report Status 06/17/2022 FINAL  Final   Organism ID, Bacteria ENTEROBACTER  AEROGENES (A)  Final   Organism ID, Bacteria ESCHERICHIA COLI (A)  Final      Susceptibility   Enterobacter aerogenes - MIC*    CEFAZOLIN RESISTANT Resistant     CEFEPIME <=0.12  SENSITIVE Sensitive     CEFTRIAXONE <=0.25 SENSITIVE Sensitive     CIPROFLOXACIN <=0.25 SENSITIVE Sensitive     GENTAMICIN <=1 SENSITIVE Sensitive     IMIPENEM 1 SENSITIVE Sensitive     NITROFURANTOIN 64 INTERMEDIATE Intermediate     TRIMETH/SULFA <=20 SENSITIVE Sensitive     PIP/TAZO <=4 SENSITIVE Sensitive     * >=100,000 COLONIES/mL ENTEROBACTER AEROGENES   Escherichia coli - MIC*    AMPICILLIN <=2 SENSITIVE Sensitive     CEFAZOLIN <=4 SENSITIVE Sensitive     CEFEPIME <=0.12 SENSITIVE Sensitive     CEFTRIAXONE <=0.25 SENSITIVE Sensitive     CIPROFLOXACIN <=0.25 SENSITIVE Sensitive     GENTAMICIN <=1 SENSITIVE Sensitive     IMIPENEM <=0.25 SENSITIVE Sensitive     NITROFURANTOIN <=16 SENSITIVE Sensitive     TRIMETH/SULFA <=20 SENSITIVE Sensitive     AMPICILLIN/SULBACTAM <=2 SENSITIVE Sensitive     PIP/TAZO <=4 SENSITIVE Sensitive     * 80,000 COLONIES/mL ESCHERICHIA COLI  MRSA Next Gen by PCR, Nasal     Status: None   Collection Time: 06/15/22  6:00 AM   Specimen: Nasal Mucosa; Nasal Swab  Result Value Ref Range Status   MRSA by PCR Next Gen NOT DETECTED NOT DETECTED Final    Comment: (NOTE) The GeneXpert MRSA Assay (FDA approved for NASAL specimens only), is one component of a comprehensive MRSA colonization surveillance program. It is not intended to diagnose MRSA infection nor to guide or monitor treatment for MRSA infections. Test performance is not FDA approved in patients less than 70 years old. Performed at [facility], [place] [address] [place], [place] [address]          Radiology Studies: No results found.      Scheduled Meds:  enoxaparin (LOVENOX) injection  40 mg Subcutaneous Q24H   famotidine  20 mg Oral BID   guaiFENesin  600 mg Oral BID   insulin aspart  0-5 Units  Subcutaneous QHS   insulin aspart  0-9 Units Subcutaneous TID WC   lisinopril  20 mg Oral Daily   montelukast  10 mg Oral QHS   oseltamivir  75 mg Oral BID   spironolactone  25 mg Oral Daily   Continuous Infusions:  cefTRIAXone (ROCEPHIN)  IV 2 g (06/18/22 0215)     LOS: 3 days    Time spent: 52 minutes spent on chart review, discussion with nursing staff, consultants, updating family and interview/physical exam; more than 50% of that time was spent in counseling and/or coordination of care.    Lilli Dewald J [place] DO [facility] Available via Epic secure chat 7am-7pm After these hours, please refer to coverage provider listed on [email or web address] 06/18/2022, 1:57 PM

## 2022-06-18 NOTE — Consult Note (Signed)
I have placed a request via Secure Chat to Dr. [place] requesting photos of the wound areas of concern to be placed in the EMR.     [place], [place], [place]

## 2022-06-19 DIAGNOSIS — N390 Urinary tract infection, site not specified: Secondary | ICD-10-CM | POA: Diagnosis not present

## 2022-06-19 DIAGNOSIS — A419 Sepsis, unspecified organism: Secondary | ICD-10-CM | POA: Diagnosis not present

## 2022-06-19 LAB — GLUCOSE, CAPILLARY
Glucose-Capillary: 250 mg/dL — ABNORMAL HIGH (ref 70–99)
Glucose-Capillary: 231 mg/dL — ABNORMAL HIGH (ref 70–99)
Glucose-Capillary: 196 mg/dL — ABNORMAL HIGH (ref 70–99)
Glucose-Capillary: 191 mg/dL — ABNORMAL HIGH (ref 70–99)

## 2022-06-19 MED ORDER — INSULIN ASPART 100 UNIT/ML IJ SOLN
2.0000 [IU] | Freq: Three times a day (TID) | INTRAMUSCULAR | Status: DC
  Administered 2022-06-19 – 2022-06-20 (×5): 2 [IU] via SUBCUTANEOUS

## 2022-06-19 MED ORDER — LISINOPRIL 20 MG PO TABS
40.0000 mg | ORAL_TABLET | Freq: Every day | ORAL | Status: DC
  Administered 2022-06-19 – 2022-06-26 (×8): 40 mg via ORAL
  Filled 2022-06-19 (×9): qty 2

## 2022-06-19 MED ORDER — INSULIN GLARGINE-YFGN 100 UNIT/ML ~~LOC~~ SOLN
10.0000 [IU] | Freq: Every day | SUBCUTANEOUS | Status: DC
  Administered 2022-06-19: 10 [IU] via SUBCUTANEOUS
  Filled 2022-06-19 (×2): qty 0.1

## 2022-06-19 MED ORDER — HYDROCERIN EX CREA
TOPICAL_CREAM | CUTANEOUS | Status: DC
  Filled 2022-06-19: qty 113

## 2022-06-19 NOTE — Consult Note (Signed)
WOC Nurse Consult Note: Venous insufficiency with edema to bilateral lower legs. Nonintact weeping to left medial lower leg near malleolus Reason for Consult: Wears Unna boots at home.  HH applies and changes on Thursday. Due to weeping, will implement topical therapy for absorption Wound type: venous insufficiency  CHF exacerbation with shortness of breath and intermittent confusion.  Pressure Injury POA: NA Measurement: Left medial lower leg with erythema and minimal serous weeping.  4 cm x 3 cm x. 01 cm Wound bed: pink and moist Drainage (amount, consistency, odor) minimal serosanguinous no odor Periwound: Dry skin, lichenification RIght great toe with 0.5 cm bruising and edema present. She is not clear on when or how this happened.  Dressing procedure/placement/frequency: Bedside RN to cleanse bilateral lower legs with soap and water and pat dry. Moisturize legs with emollient cream.  (Eucerin) Apply calcium alginate to open area left medial lower leg.  Kellie Simmering #[id number])   wrap legs with zinc layer and secure with self adherent coban.  Change weekly on Thursday .  Will not follow at this time.  Please re-consult if needed.  Estrellita Ludwig MSN, RN, FNP-BC CWON Wound, Ostomy, Continence Nurse [facility] [phone] Pager [phone]

## 2022-06-19 NOTE — Progress Notes (Signed)
PROGRESS NOTE    Jody Simpson  ZOX:[id number] DOB: 04-03-1951 DOA: 06/15/2022 PCP: Jinny Blossom, [facility]    Brief Narrative:   Jody Simpson is a 72 y.o. female with past medical history significant for HTN, HLD, SVT, DM2, GERD who presented to [facility] ED on 1/14 from independent living facility via EMS due to generalized weakness.  Patient endorsed feeling so weak that she slid out of her chair and ended up on the floor.  Reports was diagnosed with influenza Friday prior to admission.  At the facility, they cannot get her up per their policy.  Reportedly, per ILF staff patient was unresponsive on the floor and EMS was called.  On EMS arrival, vital signs notable for soft BPs, bradycardia with heart rate of 50.  In the ED, due to concern for sepsis with leukocytosis, urine culture and blood cultures obtained.  Patient was started on Rocephin empirically.  Tamiflu was started.  TRH consulted for admission for further evaluation and management.  Assessment & Plan:   Sepsis, POA Enterococcus/E. coli UTI Patient presenting to the ED with fever, temperature 101.2 F, leukocytosis 12.5.  Urinalysis consistent with UTI.  Urine culture positive for Enterococcus and E. coli.  Leukocytosis now resolved.  Completed 5-day course of ceftriaxone.  Generalized weakness, deconditioning, debility Seen by PT and OT with recommendations of SNF placement.  Patient declines and wishes to return back to her ILF with home health therapy. -- Continue therapy efforts while inpatient  Influenza A viral infection -- Continue Tamiflu 75 mg p.o. 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  Case was discussed with electrophysiology who recommended outpatient monitoring and follow-up.  Plan for 7-day Zio patch at time of discharge with outpatient follow-up with Dr. Davina Poke 4-6 weeks. -- Avoid  all AV nodal blocking agents  Acute on chronic diastolic congestive heart failure exacerbation Essential hypertension Cardiology was consulted and followed during hospital course.  Patient was supported with IV furosemide.  TTE with LVEF 60 to 65%.  Cardiology recommends Aldactone 25 mg p.o. daily, lisinopril 40 mg p.o. daily, Lasix 20 mg p.o. daily as needed for lower extremity edema.  Outpatient follow-up with cardiology.  Elevated troponin Etiology likely secondary to acute diastolic congestive heart failure exacerbation also with mild rhabdomyolysis.  Denies chest pain.  Echo with normal LV function with no wall motion abnormality.  EKG nonspecific.  LDL 78, close to goal.  Not consistent with acute coronary syndrome.  Cardiology was consulted with no need for further workup at this time.  Patient has allergies to statin and aspirin.  Plan outpatient follow-up with cardiology.  Acute renal failure Patient presenting with a creatinine of 1.3, baseline 1.0.  Etiology likely secondary to prerenal azotemia in setting of dehydration.  Supported with IV fluid hydration with improvement of creatinine to 0.95. --Continue to encourage increased oral intake  Type 2 diabetes mellitus with hyperglycemia He will go A1c 8.2, poorly controlled.  On glipizide 10 mg p.o. twice daily, Soliqua 28u [place] daily and insulin aspart 20 units twice daily before a meal at home. -- Semglee 10 units [place] daily -- NovoLog 2 units subcutaneously 3 times daily AC -- SSI for coverage -- CBGs qAC/HS  Questionable syncope Per ILF staff, patient was not responsive on the floor.  TTE with LVEF 60 to 65%, no regional wall motion normalities, mild LVH, mild aortic valve stenosis, IVC  normal in size. -- Fall precautions  Elevated CPK CPK elevated 1000 on arrival.  Supported with IV fluid hydration.  Isolated hyperbilirubinemia AST, ALT and alkaline phosphatase normal.  Total bilirubin 1.5 at presentation, now resolved following  IV fluid hydration.  Hypomagnesemia Hypokalemia Repleted.  Morbid obesity Body mass index is 46.99 kg/m.  Discussed with patient needs for aggressive lifestyle changes/weight loss as this complicates all facets of care.  Outpatient follow-up with PCP.    Pressure Injury 06/15/22 Buttocks Right Stage 2 -  Partial thickness loss of dermis presenting as a shallow open injury with a red, pink wound bed without slough. (Active)  06/15/22 1427  Location: Buttocks  Location Orientation: Right  Staging: Stage 2 -  Partial thickness loss of dermis presenting as a shallow open injury with a red, pink wound bed without slough.  Wound Description (Comments):   Present on Admission: Yes       DVT prophylaxis: enoxaparin (LOVENOX) injection 40 mg Start: 06/15/22 1000    Code Status: Full Code Family Communication: No family present at bedside this morning  Disposition Plan:  Level of care: Telemetry Medical Status is: Inpatient Remains inpatient appropriate because: Anticipate discharge home tomorrow with home health as patient refuses SNF placement    Consultants:  Cardiology  Procedures:  TTE  Antimicrobials:  Ceftriaxone 1/13>>   Subjective: Patient seen at bedside, resting comfortably.  Lying in bed.  Oxygen replaced on patient overnight for no apparent reason.  Patient continues with fatigue and mild shortness of breath.  No other specific complaints this morning.  Denies headache, no dizziness, no chest pain, no palpitations, no abdominal pain, no focal weakness, no fatigue, no fever/chills/night sweats, no nausea/vomiting/diarrhea, no paresthesias.  No acute events overnight per nurse.  Discussed anticipated discharge home with home health tomorrow.  Objective: Vitals:   06/18/22 1835 06/18/22 2155 06/19/22 0452 06/19/22 0918  BP: (!) 188/85 (!) 174/61 (!) 170/60 (!) 167/66  Pulse: 100 (!) 58 (!) 52 60  Resp: (!) '23 20 18 18  '$ Temp: 98.3 F (36.8 C) 97.8 F (36.6 C)  97.8 F (36.6 C) 98 F (36.7 C)  TempSrc: Oral Oral Oral Oral  SpO2: 92% 92% 94% 95%  Weight:      Height:        Intake/Output Summary (Last 24 hours) at 06/19/2022 1153 Last data filed at 06/19/2022 0900 Gross per 24 hour  Intake 790.99 ml  Output 2200 ml  Net -1409.01 ml   Filed Weights   06/15/22 0047  Weight: 136.1 kg    Examination:  Physical Exam: GEN: NAD, alert and oriented x 3, obese HEENT: NCAT, PERRL, EOMI, sclera clear, MMM PULM: CTAB w/o wheezes/crackles, normal respiratory effort CV: RRR w/o M/G/R GI: abd soft, NTND, NABS, no R/G/M MSK: no peripheral edema, muscle strength globally intact 5/5 bilateral upper/lower extremities NEURO: CN II-XII intact, no focal deficits, sensation to light touch intact PSYCH: normal mood/affect Integumentary: dry/intact, no rashes or wounds    Data Reviewed: I have personally reviewed following labs and imaging studies  CBC: Recent Labs  Lab 06/15/22 0055 06/15/22 0115 06/15/22 0600 06/16/22 0454 06/17/22 0438 06/18/22 0914  WBC 12.5*  --  10.3 5.7 5.8 7.1  NEUTROABS 11.4*  --   --   --   --   --   HGB 11.1* 12.2  11.9* 10.4* 9.2* 10.8* 11.6*  HCT 35.8* 36.0  35.0* 31.7* 30.0* 34.0* 37.7  MCV 96.2  --  93.8 96.8 95.0 94.7  PLT 213  --  189 175 174 659   Basic Metabolic Panel: Recent Labs  Lab 06/15/22 0055 06/15/22 0115 06/15/22 0600 06/16/22 0454 06/17/22 0438 06/18/22 0914  NA 134* 134*  134*  --  136 138 139  K 4.7 4.8  4.8  --  3.4* 4.1 3.7  CL 98 99  --  102 100 100  CO2 22  --   --  '26 29 28  '$ GLUCOSE 361* 347*  --  148* 212* 198*  BUN 15 19  --  '17 18 11  '$ CREATININE 1.33* 1.20*  --  1.23* 1.15* 0.95  CALCIUM 7.7*  --   --  7.6* 7.8* 8.1*  MG  --   --  1.6* 1.9  --   --   PHOS  --   --  4.6  --   --   --    GFR: Estimated Creatinine Clearance: 79.5 mL/min (by C-G formula based on SCr of 0.95 mg/dL). Liver Function Tests: Recent Labs  Lab 06/15/22 0055 06/16/22 0454 06/17/22 0438  06/18/22 0914  AST '28 24 22 21  '$ ALT '10 11 10 14  '$ ALKPHOS 92 76 84 88  BILITOT 1.5* 0.3 0.4 0.6  PROT 5.9* 5.2* 5.5* 6.1*  ALBUMIN 2.6* 2.1* 2.3* 2.4*   No results for input(s): "LIPASE", "AMYLASE" in the last 168 hours. No results for input(s): "AMMONIA" in the last 168 hours. Coagulation Profile: Recent Labs  Lab 06/15/22 0055  INR 1.1   Cardiac Enzymes: Recent Labs  Lab 06/15/22 0055  CKTOTAL 1,000*   BNP (last 3 results) No results for input(s): "PROBNP" in the last 8760 hours. HbA1C: No results for input(s): "HGBA1C" in the last 72 hours. CBG: Recent Labs  Lab 06/18/22 1127 06/18/22 1649 06/18/22 2155 06/19/22 0821 06/19/22 1148  GLUCAP 187* 239* 248* 250* 231*   Lipid Profile: Recent Labs    06/17/22 0438  CHOL 140  HDL 29*  LDLCALC 78  TRIG 164*  CHOLHDL 4.8   Thyroid Function Tests: No results for input(s): "TSH", "T4TOTAL", "FREET4", "T3FREE", "THYROIDAB" in the last 72 hours. Anemia Panel: No results for input(s): "VITAMINB12", "FOLATE", "FERRITIN", "TIBC", "IRON", "RETICCTPCT" in the last 72 hours. Sepsis Labs: Recent Labs  Lab 06/15/22 0054 06/15/22 0600  LATICACIDVEN 3.3* 1.7    Recent Results (from the past 240 hour(s))  Blood Culture (routine x 2)     Status: None (Preliminary result)   Collection Time: 06/15/22  1:30 AM   Specimen: BLOOD  Result Value Ref Range Status   Specimen Description BLOOD LEFT ANTECUBITAL  Final   Special Requests   Final    BOTTLES DRAWN AEROBIC AND ANAEROBIC Blood Culture results may not be optimal due to an excessive volume of blood received in culture bottles   Culture   Final    NO GROWTH 4 DAYS Performed at [facility] [address] [place],  [address]    Report Status PENDING  Incomplete  Blood Culture (routine x 2)     Status: None (Preliminary result)   Collection Time: 06/15/22  1:30 AM   Specimen: BLOOD  Result Value Ref Range Status   Specimen Description BLOOD RIGHT  ANTECUBITAL  Final   Special Requests   Final    BOTTLES DRAWN AEROBIC AND ANAEROBIC Blood Culture results may not be optimal due to an excessive volume of blood received in culture bottles   Culture   Final    NO GROWTH 4 DAYS Performed  at [facility], [place] [address], [place], [place] [address]    Report Status PENDING  Incomplete  Resp panel by RT-PCR (RSV, Flu A&B, Covid) Anterior Nasal Swab     Status: Abnormal   Collection Time: 06/15/22  1:30 AM   Specimen: Anterior Nasal Swab  Result Value Ref Range Status   SARS Coronavirus 2 by RT PCR NEGATIVE NEGATIVE Final    Comment: (NOTE) SARS-CoV-2 target nucleic acids are NOT DETECTED.  The SARS-CoV-2 RNA is generally detectable in upper respiratory specimens during the acute phase of infection. The lowest concentration of SARS-CoV-2 viral copies this assay can detect is 138 copies/mL. A negative result does not preclude SARS-Cov-2 infection and should not be used as the sole basis for treatment or other patient management decisions. A negative result may occur with  improper specimen collection/handling, submission of specimen other than nasopharyngeal swab, presence of viral mutation(s) within the areas targeted by this assay, and inadequate number of viral copies(<138 copies/mL). A negative result must be combined with clinical observations, patient history, and epidemiological information. The expected result is Negative.  Fact Sheet for Patients:  EntrepreneurPulse.com.au  Fact Sheet for Healthcare Providers:  IncredibleEmployment.be  This test is no t yet approved or cleared by the [place] FDA and  has been authorized for detection and/or diagnosis of SARS-CoV-2 by FDA under an Emergency Use Authorization (EUA). This EUA will remain  in effect (meaning this test can be used) for the duration of the COVID-19 declaration under Section 564(b)(1) of the Act, 21 U.S.C.section  360bbb-3(b)(1), unless the authorization is terminated  or revoked sooner.       Influenza A by PCR POSITIVE (A) NEGATIVE Final   Influenza B by PCR NEGATIVE NEGATIVE Final    Comment: (NOTE) The Xpert Xpress SARS-CoV-2/FLU/RSV plus assay is intended as an aid in the diagnosis of influenza from Nasopharyngeal swab specimens and should not be used as a sole basis for treatment. Nasal washings and aspirates are unacceptable for Xpert Xpress SARS-CoV-2/FLU/RSV testing.  Fact Sheet for Patients: EntrepreneurPulse.com.au  Fact Sheet for Healthcare Providers: IncredibleEmployment.be  This test is not yet approved or cleared by the [place] FDA and has been authorized for detection and/or diagnosis of SARS-CoV-2 by FDA under an Emergency Use Authorization (EUA). This EUA will remain in effect (meaning this test can be used) for the duration of the COVID-19 declaration under Section 564(b)(1) of the Act, 21 U.S.C. section 360bbb-3(b)(1), unless the authorization is terminated or revoked.     Resp Syncytial Virus by PCR NEGATIVE NEGATIVE Final    Comment: (NOTE) Fact Sheet for Patients: EntrepreneurPulse.com.au  Fact Sheet for Healthcare Providers: IncredibleEmployment.be  This test is not yet approved or cleared by the [place] FDA and has been authorized for detection and/or diagnosis of SARS-CoV-2 by FDA under an Emergency Use Authorization (EUA). This EUA will remain in effect (meaning this test can be used) for the duration of the COVID-19 declaration under Section 564(b)(1) of the Act, 21 U.S.C. section 360bbb-3(b)(1), unless the authorization is terminated or revoked.  Performed at [facility], [place] [address] [place], [place] [address]   Urine Culture     Status: Abnormal   Collection Time: 06/15/22  2:42 AM   Specimen: Urine, Catheterized  Result Value Ref Range Status   Specimen  Description URINE, CATHETERIZED  Final   Special Requests   Final    NONE Performed at [facility], [place],  [place] [address]    Culture (A)  Final    >=100,000 COLONIES/mL ENTEROBACTER AEROGENES 80,000 COLONIES/mL ESCHERICHIA COLI    Report Status 06/17/2022 FINAL  Final   Organism ID, Bacteria ENTEROBACTER AEROGENES (A)  Final   Organism ID, Bacteria ESCHERICHIA COLI (A)  Final      Susceptibility   Enterobacter aerogenes - MIC*    CEFAZOLIN RESISTANT Resistant     CEFEPIME <=0.12 SENSITIVE Sensitive     CEFTRIAXONE <=0.25 SENSITIVE Sensitive     CIPROFLOXACIN <=0.25 SENSITIVE Sensitive     GENTAMICIN <=1 SENSITIVE Sensitive     IMIPENEM 1 SENSITIVE Sensitive     NITROFURANTOIN 64 INTERMEDIATE Intermediate     TRIMETH/SULFA <=20 SENSITIVE Sensitive     PIP/TAZO <=4 SENSITIVE Sensitive     * >=100,000 COLONIES/mL ENTEROBACTER AEROGENES   Escherichia coli - MIC*    AMPICILLIN <=2 SENSITIVE Sensitive     CEFAZOLIN <=4 SENSITIVE Sensitive     CEFEPIME <=0.12 SENSITIVE Sensitive     CEFTRIAXONE <=0.25 SENSITIVE Sensitive     CIPROFLOXACIN <=0.25 SENSITIVE Sensitive     GENTAMICIN <=1 SENSITIVE Sensitive     IMIPENEM <=0.25 SENSITIVE Sensitive     NITROFURANTOIN <=16 SENSITIVE Sensitive     TRIMETH/SULFA <=20 SENSITIVE Sensitive     AMPICILLIN/SULBACTAM <=2 SENSITIVE Sensitive     PIP/TAZO <=4 SENSITIVE Sensitive     * 80,000 COLONIES/mL ESCHERICHIA COLI  MRSA Next Gen by PCR, Nasal     Status: None   Collection Time: 06/15/22  6:00 AM   Specimen: Nasal Mucosa; Nasal Swab  Result Value Ref Range Status   MRSA by PCR Next Gen NOT DETECTED NOT DETECTED Final    Comment: (NOTE) The GeneXpert MRSA Assay (FDA approved for NASAL specimens only), is one component of a comprehensive MRSA colonization surveillance program. It is not intended to diagnose MRSA infection nor to guide or monitor treatment for MRSA infections. Test performance is not FDA approved  in patients less than 85 years old. Performed at [facility], [place] [address] [place],  [address]          Radiology Studies: No results found.      Scheduled Meds:  enoxaparin (LOVENOX) injection  40 mg Subcutaneous Q24H   famotidine  20 mg Oral BID   guaiFENesin  600 mg Oral BID   insulin aspart  0-5 Units Subcutaneous QHS   insulin aspart  0-9 Units Subcutaneous TID WC   insulin aspart  2 Units Subcutaneous TID WC   insulin glargine-yfgn  10 Units Subcutaneous Daily   lisinopril  40 mg Oral Daily   montelukast  10 mg Oral QHS   oseltamivir  75 mg Oral BID   spironolactone  25 mg Oral Daily   Continuous Infusions:     LOS: 4 days    Time spent: 52 minutes spent on chart review, discussion with nursing staff, consultants, updating family and interview/physical exam; more than 50% of that time was spent in counseling and/or coordination of care.    Kathern Lobosco J [place] DO [facility] Available via Epic secure chat 7am-7pm After these hours, please refer to coverage provider listed on [email or web address] 06/19/2022, 11:53 AM

## 2022-06-19 NOTE — Progress Notes (Signed)
Orthopedic Tech Progress Note Patient Details:  Jody Simpson 05-Sep-1950 [id number]  Unna boots applied to BLE. Confirmed with pt she did not feel like they were too tight or experiencing any discomfort from the unna boots.  Ortho Devices Type of Ortho Device: Haematologist Ortho Device/Splint Location: BLE Ortho Device/Splint Interventions: Ordered, Application, Adjustment   Post Interventions Patient Tolerated: Well Instructions Provided: Care of device  Chanita Boden Jeri Modena 06/19/2022, 6:08 PM

## 2022-06-19 NOTE — Progress Notes (Signed)
Mobility Specialist Progress Note:   06/19/22 1120  Mobility  Activity Dangled on edge of bed (+ lateral scoot)  Level of Assistance Moderate assist, patient does 50-74%  Assistive Device None  Activity Response Tolerated fair  Mobility Referral Yes  $Mobility charge 1 Mobility   Pt agreeable to mobility session, then refused any OOB mobility at this time d/t SOB. Agreeable to sit EOB, requiring modA and laterally scoot to reposition in bed. Once back to supine, pt found to be soiled in BM. Pericare performed, pt left with all needs met.   Nelta Numbers Mobility Specialist Please contact via SecureChat or  Rehab office at [phone]

## 2022-06-19 NOTE — Inpatient Diabetes Management (Signed)
Inpatient Diabetes Program Recommendations  AACE/ADA: New Consensus Statement on Inpatient Glycemic Control (2015)  Target Ranges:  Prepandial:   less than 140 mg/dL      Peak postprandial:   less than 180 mg/dL (1-2 hours)      Critically ill patients:  140 - 180 mg/dL   Lab Results  Component Value Date   GLUCAP 250 (H) 06/19/2022   HGBA1C 8.2 (H) 06/15/2022    Latest Reference Range & Units 06/18/22 08:07 06/18/22 11:27 06/18/22 16:49 06/18/22 21:55 06/19/22 08:21  Glucose-Capillary 70 - 99 mg/dL 198 (H) 187 (H) 239 (H) 248 (H) 250 (H)  (H): Data is abnormally high  Diabetes history: DM2 Outpatient Diabetes medications: Glipizide 10 mg bid, Novolog 28 units bid, Soliqua 28 units Daily  Current orders for Inpatient glycemic control: Novolog 0-9 units tid, 0-5 units hs  Inpatient Diabetes Program Recommendations:   Please consider: -Add Semglee 10 units -Add Novolog 2 units tid meal coverage if eats 50%  Thank you, Bethena Roys E. Jahmez Bily, RN, MSN, CDE  Diabetes Coordinator Inpatient Glycemic Control Team Team Pager [phone] (8am-5pm) 06/19/2022 10:24 AM

## 2022-06-19 NOTE — Progress Notes (Signed)
PT Cancellation Note  Patient Details Name: Jody Simpson MRN: [id number] DOB: November 08, 1950   Cancelled Treatment:    Reason Eval/Treat Not Completed: Patient declined, no reason specified  States she is too tired to work with therapy today. She was having trouble breathing yesterday. Offered to assist with simple activities at bedside or to get into recliner but politely declines.  Candie Mile, PT, DPT Physical Therapist Acute Rehabilitation Services [facility] 06/19/2022, 2:40 PM

## 2022-06-20 DIAGNOSIS — N390 Urinary tract infection, site not specified: Secondary | ICD-10-CM | POA: Diagnosis not present

## 2022-06-20 DIAGNOSIS — A419 Sepsis, unspecified organism: Secondary | ICD-10-CM | POA: Diagnosis not present

## 2022-06-20 LAB — CULTURE, BLOOD (ROUTINE X 2)
Culture: NO GROWTH
Culture: NO GROWTH

## 2022-06-20 LAB — GLUCOSE, CAPILLARY
Glucose-Capillary: 244 mg/dL — ABNORMAL HIGH (ref 70–99)
Glucose-Capillary: 213 mg/dL — ABNORMAL HIGH (ref 70–99)
Glucose-Capillary: 258 mg/dL — ABNORMAL HIGH (ref 70–99)
Glucose-Capillary: 213 mg/dL — ABNORMAL HIGH (ref 70–99)

## 2022-06-20 MED ORDER — HYDRALAZINE HCL 50 MG PO TABS
50.0000 mg | ORAL_TABLET | Freq: Three times a day (TID) | ORAL | Status: DC
  Administered 2022-06-20 – 2022-06-21 (×3): 50 mg via ORAL
  Filled 2022-06-20 (×3): qty 1

## 2022-06-20 MED ORDER — FLUTICASONE PROPIONATE 50 MCG/ACT NA SUSP
2.0000 | Freq: Every day | NASAL | Status: DC
  Administered 2022-06-20 – 2022-06-26 (×7): 2 via NASAL
  Filled 2022-06-20 (×2): qty 16

## 2022-06-20 MED ORDER — INSULIN GLARGINE-YFGN 100 UNIT/ML ~~LOC~~ SOLN
15.0000 [IU] | Freq: Every day | SUBCUTANEOUS | Status: DC
  Administered 2022-06-20: 15 [IU] via SUBCUTANEOUS
  Filled 2022-06-20 (×2): qty 0.15

## 2022-06-20 MED ORDER — ALLOPURINOL 100 MG PO TABS
100.0000 mg | ORAL_TABLET | Freq: Every day | ORAL | Status: DC
  Administered 2022-06-20 – 2022-06-26 (×7): 100 mg via ORAL
  Filled 2022-06-20 (×7): qty 1

## 2022-06-20 NOTE — TOC Progression Note (Signed)
Transition of Care [facility]) - Initial/Assessment Note    Patient Details  Name: Jody Simpson MRN: [id number] Date of Birth: [birth year]  Transition of Care [facility]) CM/SW Contact:    Milinda Antis, LCSWA Phone Number: 06/20/2022, 4:48 PM  Clinical Narrative:                 LCSW met with patient at bedside.  The patient is now requesting SNF.  Patient's preferences include [facility].  LCSW presented patient with a copy of Medicare ratings list and informed patient of insurance approval process.    LCSW will send out referral.    Expected Discharge Plan: [place] Barriers to Discharge: Continued Medical Work up   Patient Goals and CMS Choice Patient states their goals for this hospitalization and ongoing recovery are:: To return home CMS Medicare.gov Compare Post Acute Care list provided to:: Patient Choice offered to / list presented to : Patient      Expected Discharge Plan and Services   Discharge Planning Services: CM Consult Post Acute Care Choice: [place] arrangements for the past 2 months: [place]                 DME Arranged: N/A DME Agency: NA       HH Arranged: PT, OT [place] Agency: [place] Date [place]: 06/16/22 Time HH Agency Contacted: 36 Representative spoke with at [place]: Tommi Rumps  Prior Living Arrangements/Services Living arrangements for the past 2 months: Materials engineer Lives with:: Self Patient language and need for interpreter reviewed:: Yes Do you feel safe going back to the place where you live?: Yes      Need for Family Participation in Patient Care: Yes (Comment) Care giver support system in place?: Yes (comment) Current home services: DME ([facility], w/c handicapped bathroom) Criminal Activity/Legal Involvement Pertinent to Current Situation/Hospitalization: No - Comment as needed  Activities of Daily Living Home Assistive  Devices/Equipment: Environmental consultant (specify type) ADL Screening (condition at time of admission) Patient's cognitive ability adequate to safely complete daily activities?: Yes Is the patient deaf or have difficulty hearing?: No Does the patient have difficulty seeing, even when wearing glasses/contacts?: No Does the patient have difficulty concentrating, remembering, or making decisions?: No Patient able to express need for assistance with ADLs?: Yes Does the patient have difficulty dressing or bathing?: No Independently performs ADLs?: Yes (appropriate for developmental age) Does the patient have difficulty walking or climbing stairs?: Yes Weakness of Legs: Both Weakness of Arms/Hands: None  Permission Sought/Granted Permission sought to share information with : Case Manager, Customer service manager, Family Supports Permission granted to share information with : Yes, Verbal Permission Granted              Emotional Assessment Appearance:: Appears stated age Attitude/Demeanor/Rapport: Engaged, Gracious Affect (typically observed): Accepting, Hopeful, Appropriate, Pleasant   Alcohol / Substance Use: Not Applicable Psych Involvement: No (comment)  Admission diagnosis:  Hypoxia [R09.02] Acute UTI [N39.0] AKI (acute kidney injury) ([place]) [N17.9] Sepsis secondary to UTI ([place]) [A41.9, N39.0] Influenza [J11.1] Sepsis, due to unspecified organism, unspecified whether acute organ dysfunction present [facility]) [A41.9] Patient Active Problem List   Diagnosis Date Noted   Sepsis secondary to UTI ([place]) 06/15/2022   CAP (community acquired pneumonia) 07/03/2016   Obesity-BMI 51 07/03/2016   Fall-down at home x 48 hrs 07/03/2016   PAT (paroxysmal atrial tachycardia) 07/03/2016   COLONIC POLYPS 09/08/2007   Gout 09/08/2007  GASTROESOPHAGEAL REFLUX DISEASE 09/08/2007   IRRITABLE BOWEL SYNDROME 09/08/2007   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/28/2006   Hyperlipemia  07/28/2006   SOLITARY KIDNEY 07/28/2006   Diabetes mellitus, type 2 ([place]) 07/21/2006   Essential hypertension 07/21/2006   DERMATITIS, ATOPIC 07/21/2006   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 [place] [address] Phone: [phone] Fax: [phone]     Social Determinants of Health ([place]) Social History: [place]: No Food Insecurity (06/15/2022)  Housing: Low Risk  (06/15/2022)  Transportation Needs: No Transportation Needs (06/15/2022)  Utilities: Not At Risk (06/15/2022)  Tobacco Use: Low Risk  (06/15/2022)   SDOH Interventions: Transportation Interventions: Intervention Not Indicated, Inpatient TOC, Patient Resources (Friends/Family)   Readmission Risk Interventions     No data to display

## 2022-06-20 NOTE — NC FL2 (Signed)
[place] LEVEL OF CARE FORM     IDENTIFICATION  Patient Name: Jody Simpson Birthdate: [birth year] Sex: female Admission Date (Current Location): 06/15/2022  [facility] [place] Number:  Herbalist and Address:  [facility] [place] [address] [place], [place] [address]      Provider Number: [id number]  Attending Physician Name and Address:  [place] Eric J, DO  Relative Name and Phone Number:  Carlis Stable (Brother) [phone]    Current Level of Care: Hospital Recommended Level of Care: [place] Prior Approval Number:    Date Approved/Denied:   PASRR Number: [id number]  Discharge Plan: SNF    Current Diagnoses: Patient Active Problem List   Diagnosis Date Noted   Sepsis secondary to UTI ([place]) 06/15/2022   CAP (community acquired pneumonia) 07/03/2016   Obesity-BMI 51 07/03/2016   Fall-down at home x 48 hrs 07/03/2016   PAT (paroxysmal atrial tachycardia) 07/03/2016   COLONIC POLYPS 09/08/2007   Gout 09/08/2007   GASTROESOPHAGEAL REFLUX DISEASE 09/08/2007   IRRITABLE BOWEL SYNDROME 09/08/2007   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 Buttocks Stage 2, left thigh-irritant dermatitis)                       Personal Care Assistance Level of Assistance  Bathing, Feeding, Dressing Bathing Assistance:  Maximum assistance Feeding assistance: Independent Dressing Assistance: Maximum assistance     Functional Limitations Info  Sight, Hearing, Speech Sight Info: Adequate Hearing Info: Adequate Speech Info: Adequate    SPECIAL CARE FACTORS FREQUENCY  PT (By licensed PT), OT (By licensed OT)     PT Frequency: 5x/ week OT Frequency: 5x/ week            Contractures Contractures Info: Not present    Additional Factors Info  Insulin Sliding Scale, Allergies, Code Status, Isolation Precautions Code Status Info: Full Allergies Info: Aspirin  Banana  Cholestyramine  Codeine  Diazepam  Hydrocodone  Ibuprofen  Iodine  Latex  Meperidine Hcl  Metformin  Naproxen Sodium  Penicillins  Pravastatin Sodium  Sitagliptin Phosphate  Tape   Insulin Sliding Scale Info: see d/c medication list Isolation Precautions Info: Droplet     Current Medications (06/20/2022):  This is the current hospital active medication list Current Facility-Administered Medications  Medication Dose Route Frequency Provider Last Rate Last Admin   acetaminophen (TYLENOL) tablet 650 mg  650 mg Oral Q6H PRN Opyd, Ilene Qua, MD   650 mg at 06/20/22 1017   albuterol (PROVENTIL) (2.5 MG/3ML) 0.083% nebulizer solution 3 mL  3 mL Inhalation Q6H PRN Irene Pap N, DO   3 mL at 06/20/22 1346   benzonatate (TESSALON) capsule 100 mg  100 mg Oral TID PRN [place] Eric J, DO   100 mg at 06/19/22 0549   enoxaparin (LOVENOX) injection 40 mg  40 mg Subcutaneous Q24H Kayleen Memos, DO  40 mg at 06/20/22 1017   famotidine (PEPCID) tablet 20 mg  20 mg Oral BID Kayleen Memos, DO   20 mg at 06/20/22 1017   guaiFENesin (MUCINEX) 12 hr tablet 600 mg  600 mg Oral BID [place] Eric J, DO   600 mg at 06/20/22 1017   hydrALAZINE (APRESOLINE) tablet 25 mg  25 mg Oral Q6H PRN [place] Eric J, DO   25 mg at 06/20/22 1020   hydrALAZINE (APRESOLINE) tablet 50 mg  50 mg Oral Q8H [place] Donnamarie Poag, DO   50 mg at 06/20/22 1431   hydrocerin (EUCERIN) cream    Topical Weekly [place] Donnamarie Poag, DO   Given at 06/19/22 1706   insulin aspart (novoLOG) injection 0-5 Units  0-5 Units Subcutaneous QHS Kayleen Memos, DO   2 Units at 06/17/22 2156   insulin aspart (novoLOG) injection 0-9 Units  0-9 Units Subcutaneous TID WC Irene Pap N, DO   3 Units at 06/20/22 1239   insulin aspart (novoLOG) injection 2 Units  2 Units Subcutaneous TID WC [place] Eric J, DO   2 Units at 06/20/22 1238   insulin glargine-yfgn [facility]) injection 15 Units  15 Units Subcutaneous Daily [place] Eric J, DO   15 Units at 06/20/22 1030   lisinopril (ZESTRIL) tablet 40 mg  40 mg Oral Daily [place] Eric J, DO   40 mg at 06/20/22 1016   montelukast (SINGULAIR) tablet 10 mg  10 mg Oral QHS Irene Pap N, DO   10 mg at 06/19/22 2231   Oral care mouth rinse  15 mL Mouth Rinse PRN Donne Hazel, MD       prochlorperazine (COMPAZINE) injection 10 mg  10 mg Intravenous Q6H PRN Donne Hazel, MD   10 mg at 06/16/22 1633   spironolactone (ALDACTONE) tablet 25 mg  25 mg Oral Daily Geralynn Rile, MD   25 mg at 06/20/22 1017     Discharge Medications: Please see discharge summary for a list of discharge medications.  Relevant Imaging Results:  Relevant Lab Results:   Additional Information SSN: [id number]  Paulene Floor Siyana Erney, LCSWA

## 2022-06-20 NOTE — TOC Progression Note (Signed)
Transition of Care [facility]) - Progression Note    Patient Details  Name: Jody Simpson MRN: [id number] Date of Birth: March 16, 1951  Transition of Care [facility]) CM/SW Contact  Tom-Johnson, Renea Ee, RN Phone Number: 06/20/2022, 2:00 PM  Clinical Narrative:     CM notified by LCSWA that patient is now requesting to go to SNF. CM will sign off at this time. Please consult if any need arises.      Expected Discharge Plan: [place] Barriers to Discharge: Continued Medical Work up  Expected Discharge Plan and Services   Discharge Planning Services: CM Consult Post Acute Care Choice: [place] arrangements for the past 2 months: [place]                 DME Arranged: N/A DME Agency: NA       HH Arranged: PT, OT HH Agency: [place] Date [place]: 06/16/22 Time [place]: [id number] Representative spoke with at [place]: [place] Determinants of Health ([place]) Interventions SDOH Screenings   Food Insecurity: No Food Insecurity (06/15/2022)  Housing: Low Risk  (06/15/2022)  Transportation Needs: No Transportation Needs (06/15/2022)  Utilities: Not At Risk (06/15/2022)  Tobacco Use: Low Risk  (06/15/2022)    Readmission Risk Interventions     No data to display

## 2022-06-20 NOTE — Progress Notes (Addendum)
Mobility Specialist Progress Note   06/20/22 1336  Mobility  Activity Stood at bedside;Dangled on edge of bed;Moved into chair position in bed  Level of Assistance Maximum assist, patient does 25-49%  Assistive Device Four wheel walker  Distance Ambulated (ft) 2 ft  Range of Motion/Exercises Active;All extremities  Activity Response Tolerated poorly;RN notified   Patient received in supine and agreeable to participate. Before activity, RN educated and administered insulin. Patient expressed her concerns to Probation officer after RN left regarding insulin and re-edu was provided. Required mod A + extra time for bed mobility. Stood with max A from elevated bed height and took side steps along side bed. Patient then c/o dizziness and was unable to transfer to recliner chair. Dizziness subsided with seated rest, but with any movement, patient complained of increasing dizziness. Stated the insulin was making her dizzy and deferred further mobility. Completed lateral scoot to head of bed and assisted into supine with mod A.  Performed pericare with TA and linen was changed with assistance of NT. Was left in supine with all needs met, call bell in reach.   [place] Kerin Kren, BS EXP Mobility Specialist Please contact via SecureChat or Rehab office at [phone]

## 2022-06-20 NOTE — Progress Notes (Signed)
   06/20/22 1355  Assess: MEWS Score  BP (!) 177/79  Pulse Rate 73  Resp (!) 28  SpO2 93 %  Assess: MEWS Score  MEWS Temp 0  MEWS Systolic 0  MEWS Pulse 0  MEWS RR 2  MEWS LOC 0  MEWS Score 2  MEWS Score Color Yellow  Assess: if the MEWS score is Yellow or Red  Were vital signs taken at a resting state? Yes  Focused Assessment Change from prior assessment (see assessment flowsheet)  Does the patient meet 2 or more of the SIRS criteria? No  Treat  MEWS Interventions Escalated (See documentation below)  Take Vital Signs  Increase Vital Sign Frequency  Yellow: Q 2hr X 2 then Q 4hr X 2, if remains yellow, continue Q 4hrs  Escalate  MEWS: Escalate Yellow: discuss with charge nurse/RN and consider discussing with provider and RRT  Notify: Charge Nurse/RN  Name of Charge Nurse/RN Notified Robin, RN  Date Charge Nurse/RN Notified 06/20/22  Time Charge Nurse/RN Notified 1406  Provider Notification  Provider Name/Title Eric [place] DO  Date Provider Notified 06/20/22  Time Provider Notified 1405  Method of Notification Page  Notification Reason Change in status  Provider response See new orders  Date of Provider Response 06/20/22  Time of Provider Response 1410  Document  Patient Outcome Stabilized after interventions  Progress note created (see row info) Yes  Assess: SIRS CRITERIA  SIRS Temperature  0  SIRS Pulse 0  SIRS Respirations  1  SIRS WBC 0  SIRS Score Sum  1

## 2022-06-20 NOTE — Progress Notes (Signed)
PROGRESS NOTE    Jody Simpson  GYK:[id number] DOB: [birth year] DOA: 06/15/2022 PCP: Jinny Blossom, [facility]    Brief Narrative:   Jody Simpson is a 72 y.o. female with past medical history significant for HTN, HLD, SVT, DM2, GERD who presented to [facility] ED on 1/14 from independent living facility via EMS due to generalized weakness.  Patient endorsed feeling so weak that she slid out of her chair and ended up on the floor.  Reports was diagnosed with influenza Friday prior to admission.  At the facility, they cannot get her up per their policy.  Reportedly, per ILF staff patient was unresponsive on the floor and EMS was called.  On EMS arrival, vital signs notable for soft BPs, bradycardia with heart rate of 50.  In the ED, due to concern for sepsis with leukocytosis, urine culture and blood cultures obtained.  Patient was started on Rocephin empirically.  Tamiflu was started.  TRH consulted for admission for further evaluation and management.  Assessment & Plan:   Sepsis, POA Enterococcus/E. coli UTI Patient presenting to the ED with fever, temperature 101.2 F, leukocytosis 12.5.  Urinalysis consistent with UTI.  Urine culture positive for Enterococcus and E. coli.  Leukocytosis now resolved.  Completed 5-day course of ceftriaxone.  Generalized weakness, deconditioning, debility Seen by PT and OT with recommendations of SNF placement.  Patient now amenable for SNF placement. 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  Case was discussed with electrophysiology who recommended outpatient monitoring and follow-up.  Plan for 7-day Zio patch at time of discharge with outpatient follow-up with Dr. Davina Poke 4-6 weeks. -- Avoid all AV nodal blocking agents  Acute on chronic diastolic congestive  heart failure exacerbation Essential hypertension Cardiology was consulted and followed during hospital course.  Patient was supported with IV furosemide.  TTE with LVEF 60 to 65%.  Cardiology recommends Aldactone 25 mg p.o. daily, lisinopril 40 mg p.o. daily, Lasix 20 mg p.o. daily as needed for lower extremity edema.  Outpatient follow-up with cardiology.  Elevated troponin Etiology likely secondary to acute diastolic congestive heart failure exacerbation also with mild rhabdomyolysis.  Denies chest pain.  Echo with normal LV function with no wall motion abnormality.  EKG nonspecific.  LDL 78, close to goal.  Not consistent with acute coronary syndrome.  Cardiology was consulted with no need for further workup at this time.  Patient has allergies to statin and aspirin.  Plan outpatient follow-up with cardiology.  Acute renal failure Patient presenting with a creatinine of 1.3, baseline 1.0.  Etiology likely secondary to prerenal azotemia in setting of dehydration.  Supported with IV fluid hydration with improvement of creatinine to 0.95. --Continue to encourage increased oral intake  Type 2 diabetes mellitus with hyperglycemia He will go A1c 8.2, poorly controlled.  On glipizide 10 mg p.o. twice daily, Soliqua 28u [place] daily and insulin aspart 20 units twice daily before a meal at home. -- Semglee 10 units [place] daily -- NovoLog 2 units subcutaneously 3 times daily AC -- SSI for coverage -- CBGs qAC/HS  Questionable syncope Per ILF staff, patient was not responsive on the floor.  TTE with LVEF 60 to 65%, no regional wall motion normalities, mild LVH, mild aortic valve stenosis, IVC normal in size. -- Fall precautions  Elevated CPK CPK elevated 1000 on arrival.  Supported with IV fluid hydration.  Isolated hyperbilirubinemia AST, ALT and alkaline phosphatase normal.  Total bilirubin 1.5 at presentation, now resolved following IV fluid  hydration.  Hypomagnesemia Hypokalemia Repleted.  Morbid obesity Body mass index is 46.99 kg/m.  Discussed with patient needs for aggressive lifestyle changes/weight loss as this complicates all facets of care.  Outpatient follow-up with PCP.    Pressure Injury 06/15/22 Buttocks Right Stage 2 -  Partial thickness loss of dermis presenting as a shallow open injury with a red, pink wound bed without slough. (Active)  06/15/22 1427  Location: Buttocks  Location Orientation: Right  Staging: Stage 2 -  Partial thickness loss of dermis presenting as a shallow open injury with a red, pink wound bed without slough.  Wound Description (Comments):   Present on Admission: Yes       DVT prophylaxis: enoxaparin (LOVENOX) injection 40 mg Start: 06/15/22 1000    Code Status: Full Code Family Communication: No family present at bedside this morning  Disposition Plan:  Level of care: Telemetry Medical Status is: Inpatient Remains inpatient appropriate because: Medically stable for discharge to SNF once bed available  Consultants:  Cardiology  Procedures:  TTE  Antimicrobials:  Ceftriaxone 1/13 - 1/17   Subjective: Patient seen at bedside, resting comfortably.  Lying in bed.  Patient reports she is now amenable for SNF placement as she thinks that she will have difficulty at home returning to her independent living facility.  Discussed with her this is always been the recommendations from therapy and I am in agreement.  Updated social work this morning regarding patient's change for SNF placement.   Denies headache, no dizziness, no chest pain, no palpitations, no abdominal pain, no focal weakness, no fatigue, no fever/chills/night sweats, no nausea/vomiting/diarrhea, no paresthesias.  No acute events overnight per nurse.  Medically stable for discharge to SNF once bed available.  Objective: Vitals:   06/19/22 0918 06/19/22 2046 06/20/22 0550 06/20/22 1016  BP: (!) 167/66 (!) 157/95  (!) 167/99 (!) 185/91  Pulse: 60 93 99 (!) 105  Resp: '18 18 18   '$ Temp: 98 F (36.7 C) 98 F (36.7 C) 98 F (36.7 C) 97.6 F (36.4 C)  TempSrc: Oral Oral Oral Oral  SpO2: 95% 98% 95% 94%  Weight:      Height:        Intake/Output Summary (Last 24 hours) at 06/20/2022 1118 Last data filed at 06/20/2022 0620 Gross per 24 hour  Intake 600 ml  Output 1720 ml  Net -1120 ml   Filed Weights   06/15/22 0047  Weight: 136.1 kg    Examination:  Physical Exam: GEN: NAD, alert and oriented x 3, obese, chronically ill in appearance HEENT: NCAT, PERRL, EOMI, sclera clear, MMM PULM: CTAB w/o wheezes/crackles, normal respiratory effort, on room air CV: RRR w/o M/G/R GI: abd soft, NTND, NABS, no R/G/M MSK: Moves all extremities independently NEURO: CN II-XII intact, no focal deficits, sensation to light touch intact PSYCH: normal mood/affect Integumentary: Chronic skin changes noted to bilateral lower extremities, otherwise no other concerning rashes/lesions/wounds noted on exposed skin surfaces     Data Reviewed: I have personally reviewed following labs and imaging studies  CBC: Recent Labs  Lab 06/15/22 0055 06/15/22 0115 06/15/22 0600 06/16/22 0454 06/17/22 0438 06/18/22 0914  WBC 12.5*  --  10.3 5.7 5.8 7.1  NEUTROABS 11.4*  --   --   --   --   --   HGB 11.1* 12.2  11.9* 10.4*  9.2* 10.8* 11.6*  HCT 35.8* 36.0  35.0* 31.7* 30.0* 34.0* 37.7  MCV 96.2  --  93.8 96.8 95.0 94.7  PLT 213  --  189 175 174 295   Basic Metabolic Panel: Recent Labs  Lab 06/15/22 0055 06/15/22 0115 06/15/22 0600 06/16/22 0454 06/17/22 0438 06/18/22 0914  NA 134* 134*  134*  --  136 138 139  K 4.7 4.8  4.8  --  3.4* 4.1 3.7  CL 98 99  --  102 100 100  CO2 22  --   --  '26 29 28  '$ GLUCOSE 361* 347*  --  148* 212* 198*  BUN 15 19  --  '17 18 11  '$ CREATININE 1.33* 1.20*  --  1.23* 1.15* 0.95  CALCIUM 7.7*  --   --  7.6* 7.8* 8.1*  MG  --   --  1.6* 1.9  --   --   PHOS  --   --  4.6  --    --   --    GFR: Estimated Creatinine Clearance: 79.5 mL/min (by C-G formula based on SCr of 0.95 mg/dL). Liver Function Tests: Recent Labs  Lab 06/15/22 0055 06/16/22 0454 06/17/22 0438 06/18/22 0914  AST '28 24 22 21  '$ ALT '10 11 10 14  '$ ALKPHOS 92 76 84 88  BILITOT 1.5* 0.3 0.4 0.6  PROT 5.9* 5.2* 5.5* 6.1*  ALBUMIN 2.6* 2.1* 2.3* 2.4*   No results for input(s): "LIPASE", "AMYLASE" in the last 168 hours. No results for input(s): "AMMONIA" in the last 168 hours. Coagulation Profile: Recent Labs  Lab 06/15/22 0055  INR 1.1   Cardiac Enzymes: Recent Labs  Lab 06/15/22 0055  CKTOTAL 1,000*   BNP (last 3 results) No results for input(s): "PROBNP" in the last 8760 hours. HbA1C: No results for input(s): "HGBA1C" in the last 72 hours. CBG: Recent Labs  Lab 06/19/22 0821 06/19/22 1148 06/19/22 1725 06/19/22 2048 06/20/22 0858  GLUCAP 250* 231* 196* 191* 244*   Lipid Profile: No results for input(s): "CHOL", "HDL", "LDLCALC", "TRIG", "CHOLHDL", "LDLDIRECT" in the last 72 hours.  Thyroid Function Tests: No results for input(s): "TSH", "T4TOTAL", "FREET4", "T3FREE", "THYROIDAB" in the last 72 hours. Anemia Panel: No results for input(s): "VITAMINB12", "FOLATE", "FERRITIN", "TIBC", "IRON", "RETICCTPCT" in the last 72 hours. Sepsis Labs: Recent Labs  Lab 06/15/22 0054 06/15/22 0600  LATICACIDVEN 3.3* 1.7    Recent Results (from the past 240 hour(s))  Blood Culture (routine x 2)     Status: None   Collection Time: 06/15/22  1:30 AM   Specimen: BLOOD  Result Value Ref Range Status   Specimen Description BLOOD LEFT ANTECUBITAL  Final   Special Requests   Final    BOTTLES DRAWN AEROBIC AND ANAEROBIC Blood Culture results may not be optimal due to an excessive volume of blood received in culture bottles   Culture   Final    NO GROWTH 5 DAYS Performed at [facility], [place] [address], [place], [place] [address]    Report Status 06/20/2022 FINAL  Final  Blood  Culture (routine x 2)     Status: None   Collection Time: 06/15/22  1:30 AM   Specimen: BLOOD  Result Value Ref Range Status   Specimen Description BLOOD RIGHT ANTECUBITAL  Final   Special Requests   Final    BOTTLES DRAWN AEROBIC AND ANAEROBIC Blood Culture results may not be optimal due to an excessive volume of blood received in culture bottles  Culture   Final    NO GROWTH 5 DAYS Performed at [facility], [place] [address] [place], [place] [address]    Report Status 06/20/2022 FINAL  Final  Resp panel by RT-PCR (RSV, Flu A&B, Covid) Anterior Nasal Swab     Status: Abnormal   Collection Time: 06/15/22  1:30 AM   Specimen: Anterior Nasal Swab  Result Value Ref Range Status   SARS Coronavirus 2 by RT PCR NEGATIVE NEGATIVE Final    Comment: (NOTE) SARS-CoV-2 target nucleic acids are NOT DETECTED.  The SARS-CoV-2 RNA is generally detectable in upper respiratory specimens during the acute phase of infection. The lowest concentration of SARS-CoV-2 viral copies this assay can detect is 138 copies/mL. A negative result does not preclude SARS-Cov-2 infection and should not be used as the sole basis for treatment or other patient management decisions. A negative result may occur with  improper specimen collection/handling, submission of specimen other than nasopharyngeal swab, presence of viral mutation(s) within the areas targeted by this assay, and inadequate number of viral copies(<138 copies/mL). A negative result must be combined with clinical observations, patient history, and epidemiological information. The expected result is Negative.  Fact Sheet for Patients:  EntrepreneurPulse.com.au  Fact Sheet for Healthcare Providers:  IncredibleEmployment.be  This test is no t yet approved or cleared by the [place] FDA and  has been authorized for detection and/or diagnosis of SARS-CoV-2 by FDA under an Emergency Use Authorization (EUA).  This EUA will remain  in effect (meaning this test can be used) for the duration of the COVID-19 declaration under Section 564(b)(1) of the Act, 21 U.S.C.section 360bbb-3(b)(1), unless the authorization is terminated  or revoked sooner.       Influenza A by PCR POSITIVE (A) NEGATIVE Final   Influenza B by PCR NEGATIVE NEGATIVE Final    Comment: (NOTE) The Xpert Xpress SARS-CoV-2/FLU/RSV plus assay is intended as an aid in the diagnosis of influenza from Nasopharyngeal swab specimens and should not be used as a sole basis for treatment. Nasal washings and aspirates are unacceptable for Xpert Xpress SARS-CoV-2/FLU/RSV testing.  Fact Sheet for Patients: EntrepreneurPulse.com.au  Fact Sheet for Healthcare Providers: IncredibleEmployment.be  This test is not yet approved or cleared by the [place] FDA and has been authorized for detection and/or diagnosis of SARS-CoV-2 by FDA under an Emergency Use Authorization (EUA). This EUA will remain in effect (meaning this test can be used) for the duration of the COVID-19 declaration under Section 564(b)(1) of the Act, 21 U.S.C. section 360bbb-3(b)(1), unless the authorization is terminated or revoked.     Resp Syncytial Virus by PCR NEGATIVE NEGATIVE Final    Comment: (NOTE) Fact Sheet for Patients: EntrepreneurPulse.com.au  Fact Sheet for Healthcare Providers: IncredibleEmployment.be  This test is not yet approved or cleared by the [place] FDA and has been authorized for detection and/or diagnosis of SARS-CoV-2 by FDA under an Emergency Use Authorization (EUA). This EUA will remain in effect (meaning this test can be used) for the duration of the COVID-19 declaration under Section 564(b)(1) of the Act, 21 U.S.C. section 360bbb-3(b)(1), unless the authorization is terminated or revoked.  Performed at [facility], [place] [address],  [place], [place] [address]   Urine Culture     Status: Abnormal   Collection Time: 06/15/22  2:42 AM   Specimen: Urine, Catheterized  Result Value Ref Range Status   Specimen Description URINE, CATHETERIZED  Final   Special Requests   Final  NONE Performed at [facility], [place] [address], [place], [place] [address]    Culture (A)  Final    >=100,000 COLONIES/mL ENTEROBACTER AEROGENES 80,000 COLONIES/mL ESCHERICHIA COLI    Report Status 06/17/2022 FINAL  Final   Organism ID, Bacteria ENTEROBACTER AEROGENES (A)  Final   Organism ID, Bacteria ESCHERICHIA COLI (A)  Final      Susceptibility   Enterobacter aerogenes - MIC*    CEFAZOLIN RESISTANT Resistant     CEFEPIME <=0.12 SENSITIVE Sensitive     CEFTRIAXONE <=0.25 SENSITIVE Sensitive     CIPROFLOXACIN <=0.25 SENSITIVE Sensitive     GENTAMICIN <=1 SENSITIVE Sensitive     IMIPENEM 1 SENSITIVE Sensitive     NITROFURANTOIN 64 INTERMEDIATE Intermediate     TRIMETH/SULFA <=20 SENSITIVE Sensitive     PIP/TAZO <=4 SENSITIVE Sensitive     * >=100,000 COLONIES/mL ENTEROBACTER AEROGENES   Escherichia coli - MIC*    AMPICILLIN <=2 SENSITIVE Sensitive     CEFAZOLIN <=4 SENSITIVE Sensitive     CEFEPIME <=0.12 SENSITIVE Sensitive     CEFTRIAXONE <=0.25 SENSITIVE Sensitive     CIPROFLOXACIN <=0.25 SENSITIVE Sensitive     GENTAMICIN <=1 SENSITIVE Sensitive     IMIPENEM <=0.25 SENSITIVE Sensitive     NITROFURANTOIN <=16 SENSITIVE Sensitive     TRIMETH/SULFA <=20 SENSITIVE Sensitive     AMPICILLIN/SULBACTAM <=2 SENSITIVE Sensitive     PIP/TAZO <=4 SENSITIVE Sensitive     * 80,000 COLONIES/mL ESCHERICHIA COLI  MRSA Next Gen by PCR, Nasal     Status: None   Collection Time: 06/15/22  6:00 AM   Specimen: Nasal Mucosa; Nasal Swab  Result Value Ref Range Status   MRSA by PCR Next Gen NOT DETECTED NOT DETECTED Final    Comment: (NOTE) The GeneXpert MRSA Assay (FDA approved for NASAL specimens only), is one component of a comprehensive MRSA  colonization surveillance program. It is not intended to diagnose MRSA infection nor to guide or monitor treatment for MRSA infections. Test performance is not FDA approved in patients less than 68 years old. Performed at [facility], [place] [address], [place], [place] [address]          Radiology Studies: No results found.      Scheduled Meds:  enoxaparin (LOVENOX) injection  40 mg Subcutaneous Q24H   famotidine  20 mg Oral BID   guaiFENesin  600 mg Oral BID   hydrocerin   Topical Weekly   insulin aspart  0-5 Units Subcutaneous QHS   insulin aspart  0-9 Units Subcutaneous TID WC   insulin aspart  2 Units Subcutaneous TID WC   insulin glargine-yfgn  15 Units Subcutaneous Daily   lisinopril  40 mg Oral Daily   montelukast  10 mg Oral QHS   spironolactone  25 mg Oral Daily   Continuous Infusions:     LOS: 5 days    Time spent: 52 minutes spent on chart review, discussion with nursing staff, consultants, updating family and interview/physical exam; more than 50% of that time was spent in counseling and/or coordination of care.    Henok Heacock J [place] DO [facility] Available via Epic secure chat 7am-7pm After these hours, please refer to coverage provider listed on [email or web address] 06/20/2022, 11:18 AM

## 2022-06-21 DIAGNOSIS — A419 Sepsis, unspecified organism: Secondary | ICD-10-CM | POA: Diagnosis not present

## 2022-06-21 DIAGNOSIS — N390 Urinary tract infection, site not specified: Secondary | ICD-10-CM | POA: Diagnosis not present

## 2022-06-21 LAB — GLUCOSE, CAPILLARY
Glucose-Capillary: 236 mg/dL — ABNORMAL HIGH (ref 70–99)
Glucose-Capillary: 214 mg/dL — ABNORMAL HIGH (ref 70–99)
Glucose-Capillary: 201 mg/dL — ABNORMAL HIGH (ref 70–99)
Glucose-Capillary: 187 mg/dL — ABNORMAL HIGH (ref 70–99)

## 2022-06-21 MED ORDER — HYDRALAZINE HCL 50 MG PO TABS
75.0000 mg | ORAL_TABLET | Freq: Three times a day (TID) | ORAL | Status: DC
  Administered 2022-06-21 – 2022-06-22 (×3): 75 mg via ORAL
  Filled 2022-06-21 (×3): qty 1

## 2022-06-21 MED ORDER — INSULIN ASPART 100 UNIT/ML IJ SOLN
4.0000 [IU] | Freq: Three times a day (TID) | INTRAMUSCULAR | Status: DC
  Administered 2022-06-21 – 2022-06-22 (×4): 4 [IU] via SUBCUTANEOUS

## 2022-06-21 MED ORDER — INSULIN GLARGINE-YFGN 100 UNIT/ML ~~LOC~~ SOLN
20.0000 [IU] | Freq: Every day | SUBCUTANEOUS | Status: DC
  Administered 2022-06-21: 20 [IU] via SUBCUTANEOUS
  Filled 2022-06-21 (×2): qty 0.2

## 2022-06-21 NOTE — Progress Notes (Signed)
Occupational Therapy Treatment Patient Details Name: Jody Simpson MRN: [id number] DOB: 16-Nov-1950 Today's Date: 06/21/2022   History of present illness Jody Simpson is a 72 y.o. female with medical history significant for SVT, essential hypertension, hyperlipidemia, GERD, type 2 diabetes, who presented to [facility] ED from independent living facility via EMS due to generalized weakness. She slid out of her chair. Admitted for sepsis secondary to UTI and + flu.   OT comments  Therapist called back to patient's room to assist with transfer back to bed. Patient continues to require assistance of 2 for sit to stands and for safety with transfer back to bed. Patient required assistance to lift legs into bed to return to supine. Patient would benefit from further therapy services to increase independence with self care and transfers. Acute OT to continue to follow.    Recommendations for follow up therapy are one component of a multi-disciplinary discharge planning process, led by the attending physician.  Recommendations may be updated based on patient status, additional functional criteria and insurance authorization.    Follow Up Recommendations  Skilled nursing-short term rehab (<3 hours/day)     Assistance Recommended at Discharge Intermittent Supervision/Assistance  Patient can return home with the following  Assistance with cooking/housework;A lot of help with walking and/or transfers;A lot of help with bathing/dressing/bathroom   Equipment Recommendations  None recommended by OT    Recommendations for Other Services      Precautions / Restrictions Precautions Precautions: Fall Precaution Comments: watch O2 sats Restrictions Weight Bearing Restrictions: No       Mobility Bed Mobility Overal bed mobility: Needs Assistance Bed Mobility: Sit to Supine     Supine to sit: Mod assist Sit to supine: Mod assist   General bed mobility comments: assistance with getting BLEs into  bed    Transfers Overall transfer level: Needs assistance Equipment used: Rollator (4 wheels) Transfers: Sit to/from Stand, Bed to chair/wheelchair/BSC Sit to Stand: Mod assist, +2 physical assistance     Step pivot transfers: +2 physical assistance, +2 safety/equipment, Min assist     General transfer comment: mod assist +2 to power up from raised bed and min assist +2 for safety to transfer back to bed     Balance Overall balance assessment: Needs assistance Sitting-balance support: No upper extremity supported, Feet supported Sitting balance-[name]: Good     Standing balance support: Bilateral upper extremity supported, During functional activity, Reliant on assistive device for balance Standing balance-[name]: Poor Standing balance comment: reliant on rollator for balance                           ADL either performed or assessed with clinical judgement   ADL Overall ADL's : Needs assistance/impaired     Grooming: Wash/dry hands;Wash/dry face;Oral care;Brushing hair;Set up;Sitting Grooming Details (indicate cue type and reason): in recliner                               General ADL Comments: focused on transfer and bed mobility    Extremity/Trunk Assessment              Vision       Perception     Praxis      Cognition Arousal/Alertness: Awake/alert Behavior During Therapy: WFL for tasks assessed/performed Overall Cognitive Status: Within Functional Limits for tasks assessed  General Comments: patient more are of needs and now considering SNF for continued rehab        Exercises      Shoulder Instructions       General Comments      Pertinent Vitals/ Pain       Pain Assessment Pain Assessment: Faces Faces Pain Scale: Hurts little more Pain Location: back Pain Descriptors / Indicators: Grimacing Pain Intervention(s): Limited activity within patient's  tolerance, Monitored during session, Repositioned  Home Living                                          Prior Functioning/Environment              Frequency  Min 2X/week        Progress Toward Goals  OT Goals(current goals can now be found in the care plan section)  Progress towards OT goals: Progressing toward goals  Acute Rehab OT Goals Patient Stated Goal: get more rehab OT Goal Formulation: With patient Time For Goal Achievement: 06/29/22 Potential to Achieve Goals: Good ADL Goals Pt Will Perform Lower Body Dressing: with supervision;sit to/from stand Pt Will Transfer to Toilet: with supervision;ambulating;regular height toilet;grab bars;bedside commode Pt Will Perform Toileting - Clothing Manipulation and hygiene: with supervision;sit to/from stand  Plan Discharge plan remains appropriate    Co-evaluation                 AM-PAC OT "6 Clicks" Daily Activity     Outcome Measure   Help from another person eating meals?: None Help from another person taking care of personal grooming?: A Little Help from another person toileting, which includes using toliet, bedpan, or urinal?: A Lot Help from another person bathing (including washing, rinsing, drying)?: A Lot Help from another person to put on and taking off regular upper body clothing?: A Little Help from another person to put on and taking off regular lower body clothing?: A Lot 6 Click Score: 16    End of Session Equipment Utilized During Treatment: Rollator (4 wheels)  OT Visit Diagnosis: Muscle weakness (generalized) (M62.81)   Activity Tolerance Patient tolerated treatment well   Patient Left in bed;with call bell/phone within reach;with bed alarm set   Nurse Communication Mobility status        Time: 9826-4158 OT Time Calculation (min): 14 min  Charges: OT General Charges $OT Visit: 1 Visit OT Treatments $Therapeutic Activity: 8-22 mins  Lodema Hong, [place]  Office [phone]   Trixie Dredge 06/21/2022, 2:26 PM

## 2022-06-21 NOTE — Progress Notes (Signed)
PROGRESS NOTE    Jody CHRISTIANA  Simpson:[id number] DOB: 24-Mar-1951 DOA: 06/15/2022 PCP: Jinny Blossom, [facility]    Brief Narrative:   Jody Simpson is a 72 y.o. female with past medical history significant for HTN, HLD, SVT, DM2, GERD who presented to [facility] ED on 1/14 from independent living facility via EMS due to generalized weakness.  Patient endorsed feeling so weak that she slid out of her chair and ended up on the floor.  Reports was diagnosed with influenza Friday prior to admission.  At the facility, they cannot get her up per their policy.  Reportedly, per ILF staff patient was unresponsive on the floor and EMS was called.  On EMS arrival, vital signs notable for soft BPs, bradycardia with heart rate of 50.  In the ED, due to concern for sepsis with leukocytosis, urine culture and blood cultures obtained.  Patient was started on Rocephin empirically.  Tamiflu was started.  TRH consulted for admission for further evaluation and management.  Assessment & Plan:   Sepsis, POA Enterococcus/E. coli UTI Patient presenting to the ED with fever, temperature 101.2 F, leukocytosis 12.5.  Urinalysis consistent with UTI.  Urine culture positive for Enterococcus and E. coli.  Leukocytosis now resolved.  Completed 5-day course of ceftriaxone.  Generalized weakness, deconditioning, debility Seen by PT and OT with recommendations of SNF placement.  Patient now amenable for SNF placement. 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  Case was discussed with electrophysiology who recommended outpatient monitoring and follow-up.  Plan for 7-day Zio patch at time of discharge with outpatient follow-up with Dr. Davina Poke 4-6 weeks. -- Avoid all AV nodal blocking agents  Acute on chronic diastolic congestive  heart failure exacerbation Essential hypertension Cardiology was consulted and followed during hospital course.  Patient was supported with IV furosemide.  TTE with LVEF 60 to 65%.  Cardiology recommends Aldactone 25 mg p.o. daily, lisinopril 40 mg p.o. daily, hydralazine 75 mg p.o. every 8 hours.  Lasix 20 mg p.o. daily as needed for lower extremity edema.  Outpatient follow-up with cardiology.  Elevated troponin Etiology likely secondary to acute diastolic congestive heart failure exacerbation also with mild rhabdomyolysis.  Denies chest pain.  Echo with normal LV function with no wall motion abnormality.  EKG nonspecific.  LDL 78, close to goal.  Not consistent with acute coronary syndrome.  Cardiology was consulted with no need for further workup at this time.  Patient has allergies to statin and aspirin.  Plan outpatient follow-up with cardiology.  Acute renal failure Patient presenting with a creatinine of 1.3, baseline 1.0.  Etiology likely secondary to prerenal azotemia in setting of dehydration.  Supported with IV fluid hydration with improvement of creatinine to 0.95. --Continue to encourage increased oral intake  Type 2 diabetes mellitus with hyperglycemia He will go A1c 8.2, poorly controlled.  On glipizide 10 mg p.o. twice daily, Soliqua 28u Stuckey daily and insulin aspart 20 units twice daily before a meal at home. -- Semglee 20 units  daily -- NovoLog 4 units subcutaneously 3 times daily AC -- SSI for coverage -- CBGs qAC/HS  Questionable syncope Per ILF staff, patient was not responsive on the floor.  TTE with LVEF 60 to 65%, no regional wall motion normalities, mild LVH, mild aortic valve stenosis, IVC normal in size. -- Fall precautions  Elevated CPK CPK elevated 1000 on arrival.  Supported with IV fluid hydration.  Isolated hyperbilirubinemia AST, ALT and alkaline phosphatase normal.  Total bilirubin 1.5 at presentation, now resolved following IV fluid  hydration.  Hypomagnesemia Hypokalemia Repleted.  Morbid obesity Body mass index is 46.99 kg/m.  Discussed with patient needs for aggressive lifestyle changes/weight loss as this complicates all facets of care.  Outpatient follow-up with PCP.    Pressure Injury 06/15/22 Buttocks Right Stage 2 -  Partial thickness loss of dermis presenting as a shallow open injury with a red, pink wound bed without slough. (Active)  06/15/22 1427  Location: Buttocks  Location Orientation: Right  Staging: Stage 2 -  Partial thickness loss of dermis presenting as a shallow open injury with a red, pink wound bed without slough.  Wound Description (Comments):   Present on Admission: Yes       DVT prophylaxis: enoxaparin (LOVENOX) injection 40 mg Start: 06/15/22 1000    Code Status: Full Code Family Communication: No family present at bedside this morning  Disposition Plan:  Level of care: Telemetry Medical Status is: Inpatient Remains inpatient appropriate because: Medically stable for discharge to SNF once bed available  Consultants:  Cardiology  Procedures:  TTE  Antimicrobials:  Ceftriaxone 1/13 - 1/17   Subjective: Patient seen at bedside, resting comfortably.  Lying in bed.  No specific complaints this morning.  Awaiting SNF placement.  Denies headache, no dizziness, no chest pain, no palpitations, no abdominal pain, no focal weakness, no fatigue, no fever/chills/night sweats, no nausea/vomiting/diarrhea, no paresthesias.  No acute events overnight per nurse.  Medically stable for discharge to SNF once bed available.  Objective: Vitals:   06/20/22 1800 06/20/22 2041 06/21/22 0536 06/21/22 0938  BP: (!) 168/69 (!) 156/74 (!) 156/81 (!) 155/65  Pulse: 60 (!) 56 (!) 56 (!) 46  Resp: (!) '24 18 18 18  '$ Temp: 97.6 F (36.4 C) 97.7 F (36.5 C) (!) 97.4 F (36.3 C) (!) 97.4 F (36.3 C)  TempSrc:  Oral Oral Oral  SpO2: 95% 95% 96% 96%  Weight:      Height:        Intake/Output  Summary (Last 24 hours) at 06/21/2022 1049 Last data filed at 06/21/2022 9628 Gross per 24 hour  Intake 360 ml  Output 800 ml  Net -440 ml   Filed Weights   06/15/22 0047  Weight: 136.1 kg    Examination:  Physical Exam: GEN: NAD, alert and oriented x 3, obese, chronically ill in appearance HEENT: NCAT, PERRL, EOMI, sclera clear, MMM PULM: CTAB w/o wheezes/crackles, normal respiratory effort, on room air CV: RRR w/o M/G/R GI: abd soft, NTND, NABS, no R/G/M MSK: Moves all extremities independently NEURO: CN II-XII intact, no focal deficits, sensation to light touch intact PSYCH: normal mood/affect Integumentary: Chronic skin changes noted to bilateral lower extremities, otherwise no other concerning rashes/lesions/wounds noted on exposed skin surfaces     Data Reviewed: I have personally reviewed following labs and imaging studies  CBC: Recent Labs  Lab 06/15/22 0055 06/15/22 0115 06/15/22 0600 06/16/22 0454 06/17/22 0438 06/18/22 0914  WBC 12.5*  --  10.3 5.7 5.8 7.1  NEUTROABS 11.4*  --   --   --   --   --   HGB 11.1* 12.2  11.9* 10.4* 9.2* 10.8* 11.6*  HCT 35.8* 36.0  35.0* 31.7* 30.0* 34.0* 37.7  MCV 96.2  --  93.8 96.8 95.0 94.7  PLT 213  --  189 175 174 193  Basic Metabolic Panel: Recent Labs  Lab 06/15/22 0055 06/15/22 0115 06/15/22 0600 06/16/22 0454 06/17/22 0438 06/18/22 0914  NA 134* 134*  134*  --  136 138 139  K 4.7 4.8  4.8  --  3.4* 4.1 3.7  CL 98 99  --  102 100 100  CO2 22  --   --  '26 29 28  '$ GLUCOSE 361* 347*  --  148* 212* 198*  BUN 15 19  --  '17 18 11  '$ CREATININE 1.33* 1.20*  --  1.23* 1.15* 0.95  CALCIUM 7.7*  --   --  7.6* 7.8* 8.1*  MG  --   --  1.6* 1.9  --   --   PHOS  --   --  4.6  --   --   --    GFR: Estimated Creatinine Clearance: 79.5 mL/min (by C-G formula based on SCr of 0.95 mg/dL). Liver Function Tests: Recent Labs  Lab 06/15/22 0055 06/16/22 0454 06/17/22 0438 06/18/22 0914  AST '28 24 22 21  '$ ALT '10 11 10  14  '$ ALKPHOS 92 76 84 88  BILITOT 1.5* 0.3 0.4 0.6  PROT 5.9* 5.2* 5.5* 6.1*  ALBUMIN 2.6* 2.1* 2.3* 2.4*   No results for input(s): "LIPASE", "AMYLASE" in the last 168 hours. No results for input(s): "AMMONIA" in the last 168 hours. Coagulation Profile: Recent Labs  Lab 06/15/22 0055  INR 1.1   Cardiac Enzymes: Recent Labs  Lab 06/15/22 0055  CKTOTAL 1,000*   BNP (last 3 results) No results for input(s): "PROBNP" in the last 8760 hours. HbA1C: No results for input(s): "HGBA1C" in the last 72 hours. CBG: Recent Labs  Lab 06/20/22 0858 06/20/22 1229 06/20/22 1649 06/20/22 2043 06/21/22 0718  GLUCAP 244* 213* 258* 213* 236*   Lipid Profile: No results for input(s): "CHOL", "HDL", "LDLCALC", "TRIG", "CHOLHDL", "LDLDIRECT" in the last 72 hours.  Thyroid Function Tests: No results for input(s): "TSH", "T4TOTAL", "FREET4", "T3FREE", "THYROIDAB" in the last 72 hours. Anemia Panel: No results for input(s): "VITAMINB12", "FOLATE", "FERRITIN", "TIBC", "IRON", "RETICCTPCT" in the last 72 hours. Sepsis Labs: Recent Labs  Lab 06/15/22 0054 06/15/22 0600  LATICACIDVEN 3.3* 1.7    Recent Results (from the past 240 hour(s))  Blood Culture (routine x 2)     Status: None   Collection Time: 06/15/22  1:30 AM   Specimen: BLOOD  Result Value Ref Range Status   Specimen Description BLOOD LEFT ANTECUBITAL  Final   Special Requests   Final    BOTTLES DRAWN AEROBIC AND ANAEROBIC Blood Culture results may not be optimal due to an excessive volume of blood received in culture bottles   Culture   Final    NO GROWTH 5 DAYS Performed at [facility], [place] [address], [place], [place] [address]    Report Status 06/20/2022 FINAL  Final  Blood Culture (routine x 2)     Status: None   Collection Time: 06/15/22  1:30 AM   Specimen: BLOOD  Result Value Ref Range Status   Specimen Description BLOOD RIGHT ANTECUBITAL  Final   Special Requests   Final    BOTTLES DRAWN AEROBIC AND  ANAEROBIC Blood Culture results may not be optimal due to an excessive volume of blood received in culture bottles   Culture   Final    NO GROWTH 5 DAYS Performed at [facility], [place] [address] [place], [place] [address]    Report Status 06/20/2022 FINAL  Final  Resp panel by RT-PCR (RSV, Flu A&B, Covid) Anterior Nasal Swab     Status: Abnormal   Collection Time: 06/15/22  1:30 AM   Specimen: Anterior Nasal Swab  Result Value Ref Range Status   SARS Coronavirus 2 by RT PCR NEGATIVE NEGATIVE Final    Comment: (NOTE) SARS-CoV-2 target nucleic acids are NOT DETECTED.  The SARS-CoV-2 RNA is generally detectable in upper respiratory specimens during the acute phase of infection. The lowest concentration of SARS-CoV-2 viral copies this assay can detect is 138 copies/mL. A negative result does not preclude SARS-Cov-2 infection and should not be used as the sole basis for treatment or other patient management decisions. A negative result may occur with  improper specimen collection/handling, submission of specimen other than nasopharyngeal swab, presence of viral mutation(s) within the areas targeted by this assay, and inadequate number of viral copies(<138 copies/mL). A negative result must be combined with clinical observations, patient history, and epidemiological information. The expected result is Negative.  Fact Sheet for Patients:  EntrepreneurPulse.com.au  Fact Sheet for Healthcare Providers:  IncredibleEmployment.be  This test is no t yet approved or cleared by the [place] FDA and  has been authorized for detection and/or diagnosis of SARS-CoV-2 by FDA under an Emergency Use Authorization (EUA). This EUA will remain  in effect (meaning this test can be used) for the duration of the COVID-19 declaration under Section 564(b)(1) of the Act, 21 U.S.C.section 360bbb-3(b)(1), unless the authorization is terminated  or revoked sooner.        Influenza A by PCR POSITIVE (A) NEGATIVE Final   Influenza B by PCR NEGATIVE NEGATIVE Final    Comment: (NOTE) The Xpert Xpress SARS-CoV-2/FLU/RSV plus assay is intended as an aid in the diagnosis of influenza from Nasopharyngeal swab specimens and should not be used as a sole basis for treatment. Nasal washings and aspirates are unacceptable for Xpert Xpress SARS-CoV-2/FLU/RSV testing.  Fact Sheet for Patients: EntrepreneurPulse.com.au  Fact Sheet for Healthcare Providers: IncredibleEmployment.be  This test is not yet approved or cleared by the [place] FDA and has been authorized for detection and/or diagnosis of SARS-CoV-2 by FDA under an Emergency Use Authorization (EUA). This EUA will remain in effect (meaning this test can be used) for the duration of the COVID-19 declaration under Section 564(b)(1) of the Act, 21 U.S.C. section 360bbb-3(b)(1), unless the authorization is terminated or revoked.     Resp Syncytial Virus by PCR NEGATIVE NEGATIVE Final    Comment: (NOTE) Fact Sheet for Patients: EntrepreneurPulse.com.au  Fact Sheet for Healthcare Providers: IncredibleEmployment.be  This test is not yet approved or cleared by the [place] FDA and has been authorized for detection and/or diagnosis of SARS-CoV-2 by FDA under an Emergency Use Authorization (EUA). This EUA will remain in effect (meaning this test can be used) for the duration of the COVID-19 declaration under Section 564(b)(1) of the Act, 21 U.S.C. section 360bbb-3(b)(1), unless the authorization is terminated or revoked.  Performed at [facility], [place] [address], [place], [place] [address]   Urine Culture     Status: Abnormal   Collection Time: 06/15/22  2:42 AM   Specimen: Urine, Catheterized  Result Value Ref Range Status   Specimen Description URINE, CATHETERIZED  Final   Special Requests   Final     NONE Performed at [facility], [place] [address], [place], [place] [address]    Culture (A)  Final    >=100,000 COLONIES/mL ENTEROBACTER AEROGENES 80,000 COLONIES/mL ESCHERICHIA COLI  Report Status 06/17/2022 FINAL  Final   Organism ID, Bacteria ENTEROBACTER AEROGENES (A)  Final   Organism ID, Bacteria ESCHERICHIA COLI (A)  Final      Susceptibility   Enterobacter aerogenes - MIC*    CEFAZOLIN RESISTANT Resistant     CEFEPIME <=0.12 SENSITIVE Sensitive     CEFTRIAXONE <=0.25 SENSITIVE Sensitive     CIPROFLOXACIN <=0.25 SENSITIVE Sensitive     GENTAMICIN <=1 SENSITIVE Sensitive     IMIPENEM 1 SENSITIVE Sensitive     NITROFURANTOIN 64 INTERMEDIATE Intermediate     TRIMETH/SULFA <=20 SENSITIVE Sensitive     PIP/TAZO <=4 SENSITIVE Sensitive     * >=100,000 COLONIES/mL ENTEROBACTER AEROGENES   Escherichia coli - MIC*    AMPICILLIN <=2 SENSITIVE Sensitive     CEFAZOLIN <=4 SENSITIVE Sensitive     CEFEPIME <=0.12 SENSITIVE Sensitive     CEFTRIAXONE <=0.25 SENSITIVE Sensitive     CIPROFLOXACIN <=0.25 SENSITIVE Sensitive     GENTAMICIN <=1 SENSITIVE Sensitive     IMIPENEM <=0.25 SENSITIVE Sensitive     NITROFURANTOIN <=16 SENSITIVE Sensitive     TRIMETH/SULFA <=20 SENSITIVE Sensitive     AMPICILLIN/SULBACTAM <=2 SENSITIVE Sensitive     PIP/TAZO <=4 SENSITIVE Sensitive     * 80,000 COLONIES/mL ESCHERICHIA COLI  MRSA Next Gen by PCR, Nasal     Status: None   Collection Time: 06/15/22  6:00 AM   Specimen: Nasal Mucosa; Nasal Swab  Result Value Ref Range Status   MRSA by PCR Next Gen NOT DETECTED NOT DETECTED Final    Comment: (NOTE) The GeneXpert MRSA Assay (FDA approved for NASAL specimens only), is one component of a comprehensive MRSA colonization surveillance program. It is not intended to diagnose MRSA infection nor to guide or monitor treatment for MRSA infections. Test performance is not FDA approved in patients less than 72 years old. Performed at [facility], [place] [address], [place], [place] [address]          Radiology Studies: No results found.      Scheduled Meds:  allopurinol  100 mg Oral Daily   enoxaparin (LOVENOX) injection  40 mg Subcutaneous Q24H   famotidine  20 mg Oral BID   fluticasone  2 spray Each Nare Daily   guaiFENesin  600 mg Oral BID   hydrALAZINE  75 mg Oral Q8H   hydrocerin   Topical Weekly   insulin aspart  0-5 Units Subcutaneous QHS   insulin aspart  0-9 Units Subcutaneous TID WC   insulin aspart  4 Units Subcutaneous TID WC   insulin glargine-yfgn  20 Units Subcutaneous Daily   lisinopril  40 mg Oral Daily   montelukast  10 mg Oral QHS   spironolactone  25 mg Oral Daily   Continuous Infusions:     LOS: 6 days    Time spent: 52 minutes spent on chart review, discussion with nursing staff, consultants, updating family and interview/physical exam; more than 50% of that time was spent in counseling and/or coordination of care.    Robbie Nangle J [place] DO [facility] Available via Epic secure chat 7am-7pm After these hours, please refer to coverage provider listed on [email or web address] 06/21/2022, 10:49 AM

## 2022-06-21 NOTE — Progress Notes (Signed)
Occupational Therapy Treatment Patient Details Name: Jody Simpson MRN: [id number] DOB: [birth year] Today's Date: 06/21/2022   History of present illness Jody Simpson is a 72 y.o. female with medical history significant for SVT, essential hypertension, hyperlipidemia, GERD, type 2 diabetes, who presented to [facility] ED from independent living facility via EMS due to generalized weakness. She slid out of her chair. Admitted for sepsis secondary to UTI and + flu.   OT comments  Patient requiring increased assistance with supine to sitting on EOB with mod assist from min assist. Patient required increased time to stand, assist of 2 and 2 attempts at standing before being able to transfer to recliner with rollator. Patient is now considering SNF for continuing rehab instead of returning to ILF due to increased assistance needed with mobility and transfers and limited assistance provided at [facility]. Discharge recommendations changed to SNF for continued rehab to increase independence with bed mobility, sit to stand, transfers, and self care to allow patient for safe return back to ILF. Acute OT to continue to follow.    Recommendations for follow up therapy are one component of a multi-disciplinary discharge planning process, led by the attending physician.  Recommendations may be updated based on patient status, additional functional criteria and insurance authorization.    Follow Up Recommendations  Skilled nursing-short term rehab (<3 hours/day)     Assistance Recommended at Discharge Intermittent Supervision/Assistance  Patient can return home with the following  A little help with walking and/or transfers;A little help with bathing/dressing/bathroom;Assistance with cooking/housework   Equipment Recommendations  None recommended by OT    Recommendations for Other Services      Precautions / Restrictions Precautions Precautions: Fall Precaution Comments: watch O2 sats Restrictions Weight  Bearing Restrictions: No       Mobility Bed Mobility Overal bed mobility: Needs Assistance Bed Mobility: Supine to Sit     Supine to sit: Mod assist     General bed mobility comments: increased time and mod assist with assistance to scoot hips forward and with BLEs    Transfers Overall transfer level: Needs assistance Equipment used: Rollator (4 wheels) Transfers: Sit to/from Stand, Bed to chair/wheelchair/BSC Sit to Stand: Mod assist, +2 physical assistance     Step pivot transfers: +2 physical assistance, +2 safety/equipment, Min assist     General transfer comment: mod assist +2 to power up from raised bed and min assist +2 for safety to transfer to recliner     Balance Overall balance assessment: Needs assistance Sitting-balance support: No upper extremity supported, Feet supported Sitting balance-[name]: Good     Standing balance support: Bilateral upper extremity supported, During functional activity, Reliant on assistive device for balance Standing balance-[name]: Poor Standing balance comment: reliant on rollator for balance                           ADL either performed or assessed with clinical judgement   ADL Overall ADL's : Needs assistance/impaired     Grooming: Wash/dry hands;Wash/dry face;Oral care;Brushing hair;Set up;Sitting Grooming Details (indicate cue type and reason): in recliner                                    Extremity/Trunk Assessment              Vision       Perception     Praxis  Cognition Arousal/Alertness: Awake/alert Behavior During Therapy: WFL for tasks assessed/performed Overall Cognitive Status: Within Functional Limits for tasks assessed                                 General Comments: patient more are of needs and now considering SNF for continued rehab        Exercises      Shoulder Instructions       General Comments      Pertinent Vitals/  Pain       Pain Assessment Pain Assessment: Faces Faces Pain Scale: Hurts little more Pain Location: RLE with movement Pain Descriptors / Indicators: Grimacing, Guarding Pain Intervention(s): Limited activity within patient's tolerance, Monitored during session, Repositioned  Home Living                                          Prior Functioning/Environment              Frequency  Min 2X/week        Progress Toward Goals  OT Goals(current goals can now be found in the care plan section)  Progress towards OT goals: Progressing toward goals  Acute Rehab OT Goals Patient Stated Goal: get more rehab OT Goal Formulation: With patient Time For Goal Achievement: 06/29/22 Potential to Achieve Goals: Good ADL Goals Pt Will Perform Lower Body Dressing: with supervision;sit to/from stand Pt Will Transfer to Toilet: with supervision;ambulating;regular height toilet;grab bars;bedside commode Pt Will Perform Toileting - Clothing Manipulation and hygiene: with supervision;sit to/from stand  Plan Discharge plan remains appropriate    Co-evaluation                 AM-PAC OT "6 Clicks" Daily Activity     Outcome Measure   Help from another person eating meals?: None Help from another person taking care of personal grooming?: A Little Help from another person toileting, which includes using toliet, bedpan, or urinal?: A Lot Help from another person bathing (including washing, rinsing, drying)?: A Lot Help from another person to put on and taking off regular upper body clothing?: A Little Help from another person to put on and taking off regular lower body clothing?: A Lot 6 Click Score: 16    End of Session Equipment Utilized During Treatment: Rollator (4 wheels)  OT Visit Diagnosis: Muscle weakness (generalized) (M62.81)   Activity Tolerance Patient tolerated treatment well   Patient Left in chair;with call bell/phone within reach;with chair alarm  set   Nurse Communication Mobility status        Time: 8592-9244 OT Time Calculation (min): 22 min  Charges:    Lodema Hong, [place]  Office [place] 06/21/2022, 1:09 PM

## 2022-06-21 NOTE — Progress Notes (Signed)
At times confused

## 2022-06-22 DIAGNOSIS — N390 Urinary tract infection, site not specified: Secondary | ICD-10-CM | POA: Diagnosis not present

## 2022-06-22 DIAGNOSIS — A419 Sepsis, unspecified organism: Secondary | ICD-10-CM | POA: Diagnosis not present

## 2022-06-22 LAB — GLUCOSE, CAPILLARY
Glucose-Capillary: 233 mg/dL — ABNORMAL HIGH (ref 70–99)
Glucose-Capillary: 207 mg/dL — ABNORMAL HIGH (ref 70–99)
Glucose-Capillary: 164 mg/dL — ABNORMAL HIGH (ref 70–99)
Glucose-Capillary: 171 mg/dL — ABNORMAL HIGH (ref 70–99)

## 2022-06-22 MED ORDER — INSULIN ASPART 100 UNIT/ML IJ SOLN
6.0000 [IU] | Freq: Three times a day (TID) | INTRAMUSCULAR | Status: DC
  Administered 2022-06-22 – 2022-06-26 (×10): 6 [IU] via SUBCUTANEOUS

## 2022-06-22 MED ORDER — INSULIN GLARGINE-YFGN 100 UNIT/ML ~~LOC~~ SOLN
25.0000 [IU] | Freq: Every day | SUBCUTANEOUS | Status: DC
  Administered 2022-06-22 – 2022-06-26 (×5): 25 [IU] via SUBCUTANEOUS
  Filled 2022-06-22 (×5): qty 0.25

## 2022-06-22 MED ORDER — HYDRALAZINE HCL 50 MG PO TABS
100.0000 mg | ORAL_TABLET | Freq: Three times a day (TID) | ORAL | Status: DC
  Administered 2022-06-22 – 2022-06-26 (×12): 100 mg via ORAL
  Filled 2022-06-22 (×14): qty 2

## 2022-06-22 NOTE — Progress Notes (Signed)
PROGRESS NOTE    Jody Simpson  KDX:[id number] DOB: 1950-11-25 DOA: 06/15/2022 PCP: Jinny Blossom, [facility]    Brief Narrative:   Jody Simpson is a 72 y.o. female with past medical history significant for HTN, HLD, SVT, DM2, GERD who presented to [facility] ED on 1/14 from independent living facility via EMS due to generalized weakness.  Patient endorsed feeling so weak that she slid out of her chair and ended up on the floor.  Reports was diagnosed with influenza Friday prior to admission.  At the facility, they cannot get her up per their policy.  Reportedly, per ILF staff patient was unresponsive on the floor and EMS was called.  On EMS arrival, vital signs notable for soft BPs, bradycardia with heart rate of 50.  In the ED, due to concern for sepsis with leukocytosis, urine culture and blood cultures obtained.  Patient was started on Rocephin empirically.  Tamiflu was started.  TRH consulted for admission for further evaluation and management.  Assessment & Plan:   Sepsis, POA Enterococcus/E. coli UTI Patient presenting to the ED with fever, temperature 101.2 F, leukocytosis 12.5.  Urinalysis consistent with UTI.  Urine culture positive for Enterococcus and E. coli.  Leukocytosis now resolved.  Completed 5-day course of ceftriaxone.  Generalized weakness, deconditioning, debility Seen by PT and OT with recommendations of SNF placement.  Patient now amenable for SNF placement. 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  Case was discussed with electrophysiology who recommended outpatient monitoring and follow-up.  Plan for 7-day Zio patch at time of discharge with outpatient follow-up with Dr. Davina Poke 4-6 weeks. -- Avoid  all AV nodal blocking agents  Acute on chronic diastolic congestive heart failure exacerbation Essential hypertension Cardiology was consulted and followed during hospital course.  Patient was supported with IV furosemide.  TTE with LVEF 60 to 65%.   -- Aldactone 25 mg p.o. daily -- Lisinopril 40 mg p.o. daily -- Hydralazine 100 mg p.o. every 8 hours -- Furosemide 20 mg p.o. daily as needed for lower extremity edema -- Outpatient follow-up with cardiology  Elevated troponin Etiology likely secondary to acute diastolic congestive heart failure exacerbation also with mild rhabdomyolysis.  Denies chest pain.  Echo with normal LV function with no wall motion abnormality.  EKG nonspecific.  LDL 78, close to goal.  Not consistent with acute coronary syndrome.  Cardiology was consulted with no need for further workup at this time.  Patient has allergies to statin and aspirin.  Plan outpatient follow-up with cardiology.  Acute renal failure Patient presenting with a creatinine of 1.3, baseline 1.0.  Etiology likely secondary to prerenal azotemia in setting of dehydration.  Supported with IV fluid hydration with improvement of creatinine to 0.95. --Continue to encourage increased oral intake  Type 2 diabetes mellitus with hyperglycemia He will go A1c 8.2, poorly controlled.  On glipizide 10 mg p.o. twice daily, Soliqua 28u [place] daily and insulin aspart 20 units twice daily before a meal at home. -- Semglee 25 units Gage daily -- NovoLog 6 Pastura TIDAC -- SSI for coverage -- CBGs qAC/HS  Questionable syncope Per ILF staff, patient was not responsive on the floor.  TTE with LVEF 60 to 65%, no regional wall motion normalities, mild LVH, mild aortic valve  stenosis, IVC normal in size. -- Fall precautions  Elevated CPK CPK elevated 1000 on arrival.  Supported with IV fluid hydration.  Isolated hyperbilirubinemia AST, ALT and alkaline phosphatase normal.  Total bilirubin 1.5 at presentation, now resolved  following IV fluid hydration.  Hypomagnesemia Hypokalemia Repleted.  Morbid obesity Body mass index is 51.45 kg/m.  Discussed with patient needs for aggressive lifestyle changes/weight loss as this complicates all facets of care.  Outpatient follow-up with PCP.    Pressure Injury 06/15/22 Buttocks Right Stage 2 -  Partial thickness loss of dermis presenting as a shallow open injury with a red, pink wound bed without slough. (Active)  06/15/22 1427  Location: Buttocks  Location Orientation: Right  Staging: Stage 2 -  Partial thickness loss of dermis presenting as a shallow open injury with a red, pink wound bed without slough.  Wound Description (Comments):   Present on Admission: Yes       DVT prophylaxis: enoxaparin (LOVENOX) injection 40 mg Start: 06/15/22 1000    Code Status: Full Code Family Communication: No family present at bedside this morning  Disposition Plan:  Level of care: Med-Surg Status is: Inpatient Remains inpatient appropriate because: Medically stable for discharge to SNF once bed available  Consultants:  Cardiology  Procedures:  TTE  Antimicrobials:  Ceftriaxone 1/13 - 1/17   Subjective: Patient seen at bedside, resting comfortably.  Lying in bed.  Patient with some mild shortness of breath and anxiety this morning, improved with albuterol neb.  No other specific complaints this morning.  Awaiting SNF placement.  Denies headache, no dizziness, no chest pain, no palpitations, no abdominal pain, no focal weakness, no fatigue, no fever/chills/night sweats, no nausea/vomiting/diarrhea, no paresthesias.  No acute events overnight per nurse.  Medically stable for discharge to SNF once bed available.  Objective: Vitals:   06/21/22 2027 06/22/22 0440 06/22/22 0748 06/22/22 0937  BP: (!) 161/57 (!) 184/76 (!) 179/74 (!) 178/54  Pulse: 71 73 97 (!) 55  Resp: 19 18 (!) 22 18  Temp: 97.7 F (36.5 C) 97.8 F (36.6 C) (!) 97.4 F (36.3 C) 98 F (36.7 C)   TempSrc: Oral Oral Oral   SpO2: 93% 93% 94% 94%  Weight: (!) 144.6 kg     Height: '5\' 6"'$  (1.676 m)       Intake/Output Summary (Last 24 hours) at 06/22/2022 0951 Last data filed at 06/22/2022 0900 Gross per 24 hour  Intake 480 ml  Output 650 ml  Net -170 ml   Filed Weights   06/15/22 0047 06/21/22 2027  Weight: 136.1 kg (!) 144.6 kg    Examination:  Physical Exam: GEN: NAD, alert and oriented x 3, obese, chronically ill in appearance HEENT: NCAT, PERRL, EOMI, sclera clear, MMM PULM: CTAB w/o wheezes/crackles, normal respiratory effort, on room air CV: RRR w/o M/G/R GI: abd soft, NTND, NABS, no R/G/M MSK: Moves all extremities independently NEURO: CN II-XII intact, no focal deficits, sensation to light touch intact PSYCH: normal mood/affect Integumentary: Chronic skin changes noted to bilateral lower extremities, otherwise no other concerning rashes/lesions/wounds noted on exposed skin surfaces     Data Reviewed: I have personally reviewed following labs and imaging studies  CBC: Recent Labs  Lab 06/16/22 0454 06/17/22 0438 06/18/22 0914  WBC 5.7 5.8 7.1  HGB 9.2* 10.8* 11.6*  HCT 30.0* 34.0* 37.7  MCV 96.8 95.0 94.7  PLT 175 174 378   Basic Metabolic Panel: Recent Labs  Lab 06/16/22 0454 06/17/22 0438 06/18/22 0914  NA  136 138 139  K 3.4* 4.1 3.7  CL 102 100 100  CO2 '26 29 28  '$ GLUCOSE 148* 212* 198*  BUN '17 18 11  '$ CREATININE 1.23* 1.15* 0.95  CALCIUM 7.6* 7.8* 8.1*  MG 1.9  --   --    GFR: Estimated Creatinine Clearance: 81.2 mL/min (by C-G formula based on SCr of 0.95 mg/dL). Liver Function Tests: Recent Labs  Lab 06/16/22 0454 06/17/22 0438 06/18/22 0914  AST '24 22 21  '$ ALT '11 10 14  '$ ALKPHOS 76 84 88  BILITOT 0.3 0.4 0.6  PROT 5.2* 5.5* 6.1*  ALBUMIN 2.1* 2.3* 2.4*   No results for input(s): "LIPASE", "AMYLASE" in the last 168 hours. No results for input(s): "AMMONIA" in the last 168 hours. Coagulation Profile: No results for input(s):  "INR", "PROTIME" in the last 168 hours.  Cardiac Enzymes: No results for input(s): "CKTOTAL", "CKMB", "CKMBINDEX", "TROPONINI" in the last 168 hours.  BNP (last 3 results) No results for input(s): "PROBNP" in the last 8760 hours. HbA1C: No results for input(s): "HGBA1C" in the last 72 hours. CBG: Recent Labs  Lab 06/21/22 0718 06/21/22 1140 06/21/22 1621 06/21/22 2027 06/22/22 0714  GLUCAP 236* 214* 201* 187* 233*   Lipid Profile: No results for input(s): "CHOL", "HDL", "LDLCALC", "TRIG", "CHOLHDL", "LDLDIRECT" in the last 72 hours.  Thyroid Function Tests: No results for input(s): "TSH", "T4TOTAL", "FREET4", "T3FREE", "THYROIDAB" in the last 72 hours. Anemia Panel: No results for input(s): "VITAMINB12", "FOLATE", "FERRITIN", "TIBC", "IRON", "RETICCTPCT" in the last 72 hours. Sepsis Labs: No results for input(s): "PROCALCITON", "LATICACIDVEN" in the last 168 hours.   Recent Results (from the past 240 hour(s))  Blood Culture (routine x 2)     Status: None   Collection Time: 06/15/22  1:30 AM   Specimen: BLOOD  Result Value Ref Range Status   Specimen Description BLOOD LEFT ANTECUBITAL  Final   Special Requests   Final    BOTTLES DRAWN AEROBIC AND ANAEROBIC Blood Culture results may not be optimal due to an excessive volume of blood received in culture bottles   Culture   Final    NO GROWTH 5 DAYS Performed at [facility], [employer] [address], [place], [place] [address]    Report Status 06/20/2022 FINAL  Final  Blood Culture (routine x 2)     Status: None   Collection Time: 06/15/22  1:30 AM   Specimen: BLOOD  Result Value Ref Range Status   Specimen Description BLOOD RIGHT ANTECUBITAL  Final   Special Requests   Final    BOTTLES DRAWN AEROBIC AND ANAEROBIC Blood Culture results may not be optimal due to an excessive volume of blood received in culture bottles   Culture   Final    NO GROWTH 5 DAYS Performed at [facility], [place] [address], [place],   [address]    Report Status 06/20/2022 FINAL  Final  Resp panel by RT-PCR (RSV, Flu A&B, Covid) Anterior Nasal Swab     Status: Abnormal   Collection Time: 06/15/22  1:30 AM   Specimen: Anterior Nasal Swab  Result Value Ref Range Status   SARS Coronavirus 2 by RT PCR NEGATIVE NEGATIVE Final    Comment: (NOTE) SARS-CoV-2 target nucleic acids are NOT DETECTED.  The SARS-CoV-2 RNA is generally detectable in upper respiratory specimens during the acute phase of infection. The lowest concentration of SARS-CoV-2 viral copies this assay can detect is 138 copies/mL. A negative result does not preclude SARS-Cov-2 infection and  should not be used as the sole basis for treatment or other patient management decisions. A negative result may occur with  improper specimen collection/handling, submission of specimen other than nasopharyngeal swab, presence of viral mutation(s) within the areas targeted by this assay, and inadequate number of viral copies(<138 copies/mL). A negative result must be combined with clinical observations, patient history, and epidemiological information. The expected result is Negative.  Fact Sheet for Patients:  EntrepreneurPulse.com.au  Fact Sheet for Healthcare Providers:  IncredibleEmployment.be  This test is no t yet approved or cleared by the [place] FDA and  has been authorized for detection and/or diagnosis of SARS-CoV-2 by FDA under an Emergency Use Authorization (EUA). This EUA will remain  in effect (meaning this test can be used) for the duration of the COVID-19 declaration under Section 564(b)(1) of the Act, 21 U.S.C.section 360bbb-3(b)(1), unless the authorization is terminated  or revoked sooner.       Influenza A by PCR POSITIVE (A) NEGATIVE Final   Influenza B by PCR NEGATIVE NEGATIVE Final    Comment: (NOTE) The Xpert Xpress SARS-CoV-2/FLU/RSV plus assay is intended as an aid in the diagnosis of influenza  from Nasopharyngeal swab specimens and should not be used as a sole basis for treatment. Nasal washings and aspirates are unacceptable for Xpert Xpress SARS-CoV-2/FLU/RSV testing.  Fact Sheet for Patients: EntrepreneurPulse.com.au  Fact Sheet for Healthcare Providers: IncredibleEmployment.be  This test is not yet approved or cleared by the [place] FDA and has been authorized for detection and/or diagnosis of SARS-CoV-2 by FDA under an Emergency Use Authorization (EUA). This EUA will remain in effect (meaning this test can be used) for the duration of the COVID-19 declaration under Section 564(b)(1) of the Act, 21 U.S.C. section 360bbb-3(b)(1), unless the authorization is terminated or revoked.     Resp Syncytial Virus by PCR NEGATIVE NEGATIVE Final    Comment: (NOTE) Fact Sheet for Patients: EntrepreneurPulse.com.au  Fact Sheet for Healthcare Providers: IncredibleEmployment.be  This test is not yet approved or cleared by the [place] FDA and has been authorized for detection and/or diagnosis of SARS-CoV-2 by FDA under an Emergency Use Authorization (EUA). This EUA will remain in effect (meaning this test can be used) for the duration of the COVID-19 declaration under Section 564(b)(1) of the Act, 21 U.S.C. section 360bbb-3(b)(1), unless the authorization is terminated or revoked.  Performed at [facility], [place] [address], [place], [place] [address]   Urine Culture     Status: Abnormal   Collection Time: 06/15/22  2:42 AM   Specimen: Urine, Catheterized  Result Value Ref Range Status   Specimen Description URINE, CATHETERIZED  Final   Special Requests   Final    NONE Performed at [facility], [place] [address], [place], [place] [address]    Culture (A)  Final    >=100,000 COLONIES/mL ENTEROBACTER AEROGENES 80,000 COLONIES/mL ESCHERICHIA COLI    Report Status 06/17/2022 FINAL   Final   Organism ID, Bacteria ENTEROBACTER AEROGENES (A)  Final   Organism ID, Bacteria ESCHERICHIA COLI (A)  Final      Susceptibility   Enterobacter aerogenes - MIC*    CEFAZOLIN RESISTANT Resistant     CEFEPIME <=0.12 SENSITIVE Sensitive     CEFTRIAXONE <=0.25 SENSITIVE Sensitive     CIPROFLOXACIN <=0.25 SENSITIVE Sensitive     GENTAMICIN <=1 SENSITIVE Sensitive     IMIPENEM 1 SENSITIVE Sensitive     NITROFURANTOIN 64 INTERMEDIATE Intermediate     TRIMETH/SULFA <=20  SENSITIVE Sensitive     PIP/TAZO <=4 SENSITIVE Sensitive     * >=100,000 COLONIES/mL ENTEROBACTER AEROGENES   Escherichia coli - MIC*    AMPICILLIN <=2 SENSITIVE Sensitive     CEFAZOLIN <=4 SENSITIVE Sensitive     CEFEPIME <=0.12 SENSITIVE Sensitive     CEFTRIAXONE <=0.25 SENSITIVE Sensitive     CIPROFLOXACIN <=0.25 SENSITIVE Sensitive     GENTAMICIN <=1 SENSITIVE Sensitive     IMIPENEM <=0.25 SENSITIVE Sensitive     NITROFURANTOIN <=16 SENSITIVE Sensitive     TRIMETH/SULFA <=20 SENSITIVE Sensitive     AMPICILLIN/SULBACTAM <=2 SENSITIVE Sensitive     PIP/TAZO <=4 SENSITIVE Sensitive     * 80,000 COLONIES/mL ESCHERICHIA COLI  MRSA Next Gen by PCR, Nasal     Status: None   Collection Time: 06/15/22  6:00 AM   Specimen: Nasal Mucosa; Nasal Swab  Result Value Ref Range Status   MRSA by PCR Next Gen NOT DETECTED NOT DETECTED Final    Comment: (NOTE) The GeneXpert MRSA Assay (FDA approved for NASAL specimens only), is one component of a comprehensive MRSA colonization surveillance program. It is not intended to diagnose MRSA infection nor to guide or monitor treatment for MRSA infections. Test performance is not FDA approved in patients less than 74 years old. Performed at [facility], [place] [address] [place], [place] [address]          Radiology Studies: No results found.      Scheduled Meds:  allopurinol  100 mg Oral Daily   enoxaparin (LOVENOX) injection  40 mg Subcutaneous Q24H    famotidine  20 mg Oral BID   fluticasone  2 spray Each Nare Daily   guaiFENesin  600 mg Oral BID   hydrALAZINE  100 mg Oral Q8H   hydrocerin   Topical Weekly   insulin aspart  0-5 Units Subcutaneous QHS   insulin aspart  0-9 Units Subcutaneous TID WC   insulin aspart  4 Units Subcutaneous TID WC   insulin glargine-yfgn  20 Units Subcutaneous Daily   lisinopril  40 mg Oral Daily   montelukast  10 mg Oral QHS   spironolactone  25 mg Oral Daily   Continuous Infusions:     LOS: 7 days    Time spent: 49 minutes spent on chart review, discussion with nursing staff, consultants, updating family and interview/physical exam; more than 50% of that time was spent in counseling and/or coordination of care.    Zacariah Belue J [place] DO [facility] Available via Epic secure chat 7am-7pm After these hours, please refer to coverage provider listed on [email or web address] 06/22/2022, 9:51 AM

## 2022-06-23 DIAGNOSIS — N390 Urinary tract infection, site not specified: Secondary | ICD-10-CM | POA: Diagnosis not present

## 2022-06-23 DIAGNOSIS — A419 Sepsis, unspecified organism: Secondary | ICD-10-CM | POA: Diagnosis not present

## 2022-06-23 LAB — CBC
HCT: 40.2 % (ref 36.0–46.0)
Hemoglobin: 13.1 g/dL (ref 12.0–15.0)
MCH: 30 pg (ref 26.0–34.0)
MCHC: 32.6 g/dL (ref 30.0–36.0)
MCV: 92 fL (ref 80.0–100.0)
Platelets: 325 10*3/uL (ref 150–400)
RBC: 4.37 MIL/uL (ref 3.87–5.11)
RDW: 14.5 % (ref 11.5–15.5)
WBC: 11.4 10*3/uL — ABNORMAL HIGH (ref 4.0–10.5)
nRBC: 0 % (ref 0.0–0.2)

## 2022-06-23 LAB — BASIC METABOLIC PANEL
Anion gap: 12 (ref 5–15)
BUN: 24 mg/dL — ABNORMAL HIGH (ref 8–23)
CO2: 29 mmol/L (ref 22–32)
Calcium: 7.8 mg/dL — ABNORMAL LOW (ref 8.9–10.3)
Chloride: 96 mmol/L — ABNORMAL LOW (ref 98–111)
Creatinine, Ser: 1.21 mg/dL — ABNORMAL HIGH (ref 0.44–1.00)
GFR, Estimated: 48 mL/min — ABNORMAL LOW (ref >60)
Glucose, Bld: 167 mg/dL — ABNORMAL HIGH (ref 70–99)
Potassium: 3 mmol/L — ABNORMAL LOW (ref 3.5–5.1)
Sodium: 137 mmol/L (ref 135–145)

## 2022-06-23 LAB — MAGNESIUM: Magnesium: 1.7 mg/dL (ref 1.7–2.4)

## 2022-06-23 LAB — GLUCOSE, CAPILLARY
Glucose-Capillary: 158 mg/dL — ABNORMAL HIGH (ref 70–99)
Glucose-Capillary: 170 mg/dL — ABNORMAL HIGH (ref 70–99)
Glucose-Capillary: 211 mg/dL — ABNORMAL HIGH (ref 70–99)
Glucose-Capillary: 151 mg/dL — ABNORMAL HIGH (ref 70–99)

## 2022-06-23 MED ORDER — POTASSIUM CHLORIDE CRYS ER 20 MEQ PO TBCR
40.0000 meq | EXTENDED_RELEASE_TABLET | ORAL | Status: AC
  Administered 2022-06-23 (×2): 40 meq via ORAL
  Filled 2022-06-23 (×2): qty 2

## 2022-06-23 MED ORDER — POTASSIUM CHLORIDE IN NACL 20-0.9 MEQ/L-% IV SOLN
INTRAVENOUS | Status: AC
  Filled 2022-06-23 (×2): qty 1000

## 2022-06-23 MED ORDER — MAGNESIUM SULFATE 2 GM/50ML IV SOLN
2.0000 g | Freq: Once | INTRAVENOUS | Status: AC
  Administered 2022-06-23: 2 g via INTRAVENOUS
  Filled 2022-06-23: qty 50

## 2022-06-23 NOTE — Progress Notes (Signed)
PROGRESS NOTE    Jody Simpson  HUT:[id number] DOB: 02/02/1951 DOA: 06/15/2022 PCP: Jinny Blossom, [facility]    Brief Narrative:   Jody Simpson is a 72 y.o. female with past medical history significant for HTN, HLD, SVT, DM2, GERD who presented to [facility] ED on 1/14 from independent living facility via EMS due to generalized weakness.  Patient endorsed feeling so weak that she slid out of her chair and ended up on the floor.  Reports was diagnosed with influenza Friday prior to admission.  At the facility, they cannot get her up per their policy.  Reportedly, per ILF staff patient was unresponsive on the floor and EMS was called.  On EMS arrival, vital signs notable for soft BPs, bradycardia with heart rate of 50.  In the ED, due to concern for sepsis with leukocytosis, urine culture and blood cultures obtained.  Patient was started on Rocephin empirically.  Tamiflu was started.  TRH consulted for admission for further evaluation and management.  Assessment & Plan:   Sepsis, POA Enterococcus/E. coli UTI Patient presenting to the ED with fever, temperature 101.2 F, leukocytosis 12.5.  Urinalysis consistent with UTI.  Urine culture positive for Enterococcus and E. coli.  Leukocytosis now resolved.  Completed 5-day course of ceftriaxone.  Generalized weakness, deconditioning, debility Seen by PT and OT with recommendations of SNF placement.  Patient now amenable for SNF placement. 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  Case was discussed with electrophysiology who recommended outpatient monitoring and follow-up.  Plan for 7-day Zio patch at time of discharge with outpatient follow-up with Dr. Davina Poke 4-6 weeks. -- Avoid  all AV nodal blocking agents  Acute on chronic diastolic congestive heart failure exacerbation Essential hypertension Cardiology was consulted and followed during hospital course.  Patient was supported with IV furosemide.  TTE with LVEF 60 to 65%.   -- Aldactone 25 mg p.o. daily -- Lisinopril 40 mg p.o. daily -- Hydralazine 100 mg p.o. every 8 hours -- Furosemide 20 mg p.o. daily as needed for lower extremity edema -- Outpatient follow-up with cardiology  Elevated troponin Etiology likely secondary to acute diastolic congestive heart failure exacerbation also with mild rhabdomyolysis.  Denies chest pain.  Echo with normal LV function with no wall motion abnormality.  EKG nonspecific.  LDL 78, close to goal.  Not consistent with acute coronary syndrome.  Cardiology was consulted with no need for further workup at this time.  Patient has allergies to statin and aspirin.  Plan outpatient follow-up with cardiology.  Acute renal failure Patient presenting with a creatinine of 1.3, baseline 1.0.  Etiology likely secondary to prerenal azotemia in setting of dehydration.  Supported with IV fluid hydration with improvement of creatinine to 0.95. --Continue to encourage increased oral intake  Type 2 diabetes mellitus with hyperglycemia He will go A1c 8.2, poorly controlled.  On glipizide 10 mg p.o. twice daily, Soliqua 28u [place] daily and insulin aspart 20 units twice daily before a meal at home. -- Semglee 25 units [place] daily -- NovoLog 6 [place] TIDAC -- SSI for coverage -- CBGs qAC/HS  Questionable syncope Per ILF staff, patient was not responsive on the floor.  TTE with LVEF 60 to 65%, no regional wall motion normalities, mild LVH, mild aortic valve  stenosis, IVC normal in size. -- Fall precautions  Elevated CPK CPK elevated 1000 on arrival.  Supported with IV fluid hydration.  Isolated hyperbilirubinemia AST, ALT and alkaline phosphatase normal.  Total bilirubin 1.5 at presentation, now resolved  following IV fluid hydration.  Hypomagnesemia Hypokalemia Potassium 3.0, magnesium 1.7 this morning, will replete. -- Repeat electrolytes in a.m.  Morbid obesity Body mass index is 51.45 kg/m.  Discussed with patient needs for aggressive lifestyle changes/weight loss as this complicates all facets of care.  Outpatient follow-up with PCP.    Pressure Injury 06/15/22 Buttocks Right Stage 2 -  Partial thickness loss of dermis presenting as a shallow open injury with a red, pink wound bed without slough. (Active)  06/15/22 1427  Location: Buttocks  Location Orientation: Right  Staging: Stage 2 -  Partial thickness loss of dermis presenting as a shallow open injury with a red, pink wound bed without slough.  Wound Description (Comments):   Present on Admission: Yes       DVT prophylaxis: enoxaparin (LOVENOX) injection 40 mg Start: 06/15/22 1000    Code Status: Full Code Family Communication: No family present at bedside this morning  Disposition Plan:  Level of care: Med-Surg Status is: Inpatient Remains inpatient appropriate because: Medically stable for discharge to SNF once bed available  Consultants:  Cardiology  Procedures:  TTE  Antimicrobials:  Ceftriaxone 1/13 - 1/17   Subjective: Patient seen at bedside, resting comfortably.  Lying in bed.  Continues to complain of intermittent dizziness, overall feeling "unwell".  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  Replating electrolytes today.  Awaiting SNF placement.  Denies headache, no chest pain, no palpitations, no abdominal pain, no focal weakness, no fatigue, no fever/chills/night sweats, no nausea/vomiting/diarrhea, no paresthesias.  No acute events overnight per nurse.  Medically stable for discharge to SNF once bed available.  Objective: Vitals:   06/22/22 1638 06/22/22 2051 06/23/22 0544  06/23/22 0748  BP: (!) 166/75 (!) 151/67 (!) 141/89 (!) 157/72  Pulse: (!) 52 (!) 103 95 (!) 58  Resp: '18 18 18 20  '$ Temp: 98.4 F (36.9 C) (!) 97.5 F (36.4 C) 98 F (36.7 C) 97.6 F (36.4 C)  TempSrc:  Oral Oral Oral  SpO2: 95% 94% 93% 93%  Weight:  (!) 144.6 kg    Height:  '5\' 6"'$  (1.676 m)      Intake/Output Summary (Last 24 hours) at 06/23/2022 1026 Last data filed at 06/23/2022 6378 Gross per 24 hour  Intake 480 ml  Output 500 ml  Net -20 ml   Filed Weights   06/15/22 0047 06/21/22 2027 06/22/22 2051  Weight: 136.1 kg (!) 144.6 kg (!) 144.6 kg    Examination:  Physical Exam: GEN: NAD, alert and oriented x 3, obese, chronically ill in appearance, appears older than stated age HEENT: NCAT, PERRL, EOMI, sclera clear, MMM PULM: CTAB w/o wheezes/crackles, normal respiratory effort, on room air CV: RRR w/o M/G/R GI: abd soft, NTND, NABS, no R/G/M MSK: Moves all extremities independently NEURO: CN II-XII intact, no focal deficits, sensation to light touch intact PSYCH: normal mood/affect Integumentary: Chronic skin changes noted to bilateral lower extremities with nonpitting edema with [place] boot wraps on, otherwise no other concerning rashes/lesions/wounds noted on exposed skin surfaces     Data Reviewed: I have personally reviewed following labs and imaging studies  CBC: Recent Labs  Lab 06/17/22 0438 06/18/22 0914 06/23/22 0428  WBC 5.8 7.1 11.4*  HGB 10.8* 11.6* 13.1  HCT 34.0* 37.7 40.2  MCV 95.0 94.7 92.0  PLT 174 193 867   Basic Metabolic Panel: Recent Labs  Lab 06/17/22 0438 06/18/22 0914 06/23/22 0428  NA 138 139 137  K 4.1 3.7 3.0*  CL 100 100 96*  CO2 '29 28 29  '$ GLUCOSE 212* 198* 167*  BUN 18 11 24*  CREATININE 1.15* 0.95 1.21*  CALCIUM 7.8* 8.1* 7.8*  MG  --   --  1.7   GFR: Estimated Creatinine Clearance: 63.8 mL/min (A) (by C-G formula based on SCr of 1.21 mg/dL (H)). Liver Function Tests: Recent Labs  Lab 06/17/22 0438 06/18/22 0914   AST 22 21  ALT 10 14  ALKPHOS 84 88  BILITOT 0.4 0.6  PROT 5.5* 6.1*  ALBUMIN 2.3* 2.4*   No results for input(s): "LIPASE", "AMYLASE" in the last 168 hours. No results for input(s): "AMMONIA" in the last 168 hours. Coagulation Profile: No results for input(s): "INR", "PROTIME" in the last 168 hours.  Cardiac Enzymes: No results for input(s): "CKTOTAL", "CKMB", "CKMBINDEX", "TROPONINI" in the last 168 hours.  BNP (last 3 results) No results for input(s): "PROBNP" in the last 8760 hours. HbA1C: No results for input(s): "HGBA1C" in the last 72 hours. CBG: Recent Labs  Lab 06/22/22 0714 06/22/22 1120 06/22/22 1637 06/22/22 2047 06/23/22 0746  GLUCAP 233* 207* 164* 171* 158*   Lipid Profile: No results for input(s): "CHOL", "HDL", "LDLCALC", "TRIG", "CHOLHDL", "LDLDIRECT" in the last 72 hours.  Thyroid Function Tests: No results for input(s): "TSH", "T4TOTAL", "FREET4", "T3FREE", "THYROIDAB" in the last 72 hours. Anemia Panel: No results for input(s): "VITAMINB12", "FOLATE", "FERRITIN", "TIBC", "IRON", "RETICCTPCT" in the last 72 hours. Sepsis Labs: No results for input(s): "PROCALCITON", "LATICACIDVEN" in the last 168 hours.   Recent Results (from the past 240 hour(s))  Blood Culture (routine x 2)     Status: None   Collection Time: 06/15/22  1:30 AM   Specimen: BLOOD  Result Value Ref Range Status   Specimen Description BLOOD LEFT ANTECUBITAL  Final   Special Requests   Final    BOTTLES DRAWN AEROBIC AND ANAEROBIC Blood Culture results may not be optimal due to an excessive volume of blood received in culture bottles   Culture   Final    NO GROWTH 5 DAYS Performed at [facility], [place] [address], [place], [place] [address]    Report Status 06/20/2022 FINAL  Final  Blood Culture (routine x 2)     Status: None   Collection Time: 06/15/22  1:30 AM   Specimen: BLOOD  Result Value Ref Range Status   Specimen Description BLOOD RIGHT ANTECUBITAL  Final    Special Requests   Final    BOTTLES DRAWN AEROBIC AND ANAEROBIC Blood Culture results may not be optimal due to an excessive volume of blood received in culture bottles   Culture   Final    NO GROWTH 5 DAYS Performed at [facility], [place] [address] [place], [place] [address]    Report Status 06/20/2022 FINAL  Final  Resp panel by RT-PCR (RSV, Flu A&B, Covid) Anterior Nasal Swab     Status: Abnormal   Collection Time: 06/15/22  1:30 AM   Specimen: Anterior Nasal Swab  Result Value Ref Range Status   SARS Coronavirus 2 by RT PCR NEGATIVE NEGATIVE Final    Comment: (NOTE) SARS-CoV-2 target nucleic acids are NOT DETECTED.  The SARS-CoV-2 RNA is  generally detectable in upper respiratory specimens during the acute phase of infection. The lowest concentration of SARS-CoV-2 viral copies this assay can detect is 138 copies/mL. A negative result does not preclude SARS-Cov-2 infection and should not be used as the sole basis for treatment or other patient management decisions. A negative result may occur with  improper specimen collection/handling, submission of specimen other than nasopharyngeal swab, presence of viral mutation(s) within the areas targeted by this assay, and inadequate number of viral copies(<138 copies/mL). A negative result must be combined with clinical observations, patient history, and epidemiological information. The expected result is Negative.  Fact Sheet for Patients:  EntrepreneurPulse.com.au  Fact Sheet for Healthcare Providers:  IncredibleEmployment.be  This test is no t yet approved or cleared by the [place] FDA and  has been authorized for detection and/or diagnosis of SARS-CoV-2 by FDA under an Emergency Use Authorization (EUA). This EUA will remain  in effect (meaning this test can be used) for the duration of the COVID-19 declaration under Section 564(b)(1) of the Act, 21 U.S.C.section 360bbb-3(b)(1),  unless the authorization is terminated  or revoked sooner.       Influenza A by PCR POSITIVE (A) NEGATIVE Final   Influenza B by PCR NEGATIVE NEGATIVE Final    Comment: (NOTE) The Xpert Xpress SARS-CoV-2/FLU/RSV plus assay is intended as an aid in the diagnosis of influenza from Nasopharyngeal swab specimens and should not be used as a sole basis for treatment. Nasal washings and aspirates are unacceptable for Xpert Xpress SARS-CoV-2/FLU/RSV testing.  Fact Sheet for Patients: EntrepreneurPulse.com.au  Fact Sheet for Healthcare Providers: IncredibleEmployment.be  This test is not yet approved or cleared by the [place] FDA and has been authorized for detection and/or diagnosis of SARS-CoV-2 by FDA under an Emergency Use Authorization (EUA). This EUA will remain in effect (meaning this test can be used) for the duration of the COVID-19 declaration under Section 564(b)(1) of the Act, 21 U.S.C. section 360bbb-3(b)(1), unless the authorization is terminated or revoked.     Resp Syncytial Virus by PCR NEGATIVE NEGATIVE Final    Comment: (NOTE) Fact Sheet for Patients: EntrepreneurPulse.com.au  Fact Sheet for Healthcare Providers: IncredibleEmployment.be  This test is not yet approved or cleared by the [place] FDA and has been authorized for detection and/or diagnosis of SARS-CoV-2 by FDA under an Emergency Use Authorization (EUA). This EUA will remain in effect (meaning this test can be used) for the duration of the COVID-19 declaration under Section 564(b)(1) of the Act, 21 U.S.C. section 360bbb-3(b)(1), unless the authorization is terminated or revoked.  Performed at [facility], [place] [address] [place], [place] [address]   Urine Culture     Status: Abnormal   Collection Time: 06/15/22  2:42 AM   Specimen: Urine, Catheterized  Result Value Ref Range Status   Specimen Description  URINE, CATHETERIZED  Final   Special Requests   Final    NONE Performed at [facility], [place] [address], [place], [place] [address]    Culture (A)  Final    >=100,000 COLONIES/mL ENTEROBACTER AEROGENES 80,000 COLONIES/mL ESCHERICHIA COLI    Report Status 06/17/2022 FINAL  Final   Organism ID, Bacteria ENTEROBACTER AEROGENES (A)  Final   Organism ID, Bacteria ESCHERICHIA COLI (A)  Final      Susceptibility   Enterobacter aerogenes - MIC*    CEFAZOLIN RESISTANT Resistant     CEFEPIME <=0.12 SENSITIVE Sensitive     CEFTRIAXONE <=0.25 SENSITIVE Sensitive  CIPROFLOXACIN <=0.25 SENSITIVE Sensitive     GENTAMICIN <=1 SENSITIVE Sensitive     IMIPENEM 1 SENSITIVE Sensitive     NITROFURANTOIN 64 INTERMEDIATE Intermediate     TRIMETH/SULFA <=20 SENSITIVE Sensitive     PIP/TAZO <=4 SENSITIVE Sensitive     * >=100,000 COLONIES/mL ENTEROBACTER AEROGENES   Escherichia coli - MIC*    AMPICILLIN <=2 SENSITIVE Sensitive     CEFAZOLIN <=4 SENSITIVE Sensitive     CEFEPIME <=0.12 SENSITIVE Sensitive     CEFTRIAXONE <=0.25 SENSITIVE Sensitive     CIPROFLOXACIN <=0.25 SENSITIVE Sensitive     GENTAMICIN <=1 SENSITIVE Sensitive     IMIPENEM <=0.25 SENSITIVE Sensitive     NITROFURANTOIN <=16 SENSITIVE Sensitive     TRIMETH/SULFA <=20 SENSITIVE Sensitive     AMPICILLIN/SULBACTAM <=2 SENSITIVE Sensitive     PIP/TAZO <=4 SENSITIVE Sensitive     * 80,000 COLONIES/mL ESCHERICHIA COLI  MRSA Next Gen by PCR, Nasal     Status: None   Collection Time: 06/15/22  6:00 AM   Specimen: Nasal Mucosa; Nasal Swab  Result Value Ref Range Status   MRSA by PCR Next Gen NOT DETECTED NOT DETECTED Final    Comment: (NOTE) The GeneXpert MRSA Assay (FDA approved for NASAL specimens only), is one component of a comprehensive MRSA colonization surveillance program. It is not intended to diagnose MRSA infection nor to guide or monitor treatment for MRSA infections. Test performance is not FDA approved in patients  less than 83 years old. Performed at [facility], [place] [address] [place], [place] [address]          Radiology Studies: No results found.      Scheduled Meds:  allopurinol  100 mg Oral Daily   enoxaparin (LOVENOX) injection  40 mg Subcutaneous Q24H   famotidine  20 mg Oral BID   fluticasone  2 spray Each Nare Daily   guaiFENesin  600 mg Oral BID   hydrALAZINE  100 mg Oral Q8H   hydrocerin   Topical Weekly   insulin aspart  0-5 Units Subcutaneous QHS   insulin aspart  0-9 Units Subcutaneous TID WC   insulin aspart  6 Units Subcutaneous TID WC   insulin glargine-yfgn  25 Units Subcutaneous Daily   lisinopril  40 mg Oral Daily   montelukast  10 mg Oral QHS   potassium chloride  40 mEq Oral Q3H   spironolactone  25 mg Oral Daily   Continuous Infusions:  0.9 % NaCl with KCl 20 mEq / L 75 mL/hr at 06/23/22 0822      LOS: 8 days    Time spent: 49 minutes spent on chart review, discussion with nursing staff, consultants, updating family and interview/physical exam; more than 50% of that time was spent in counseling and/or coordination of care.    Che Below J [place] DO [facility] Available via Epic secure chat 7am-7pm After these hours, please refer to coverage provider listed on [email or web address] 06/23/2022, 10:26 AM

## 2022-06-23 NOTE — Progress Notes (Signed)
Mobility Specialist Progress Note:    06/23/22 1520  Mobility  Activity Transferred from chair to bed  Level of Assistance +2 (takes two people)  Games developer wheel walker  Distance Ambulated (ft) 2 ft  Activity Response Tolerated fair  Mobility Referral Yes  $Mobility charge 1 Mobility   Pt requested a transfer back to bed. Required +2 assistance with a RW. Pt c/o SOB throughout. Left pt in bed with alarm on and all needs met.   Royetta Crochet Mobility Specialist Please contact via Solicitor or  Rehab office at [phone]

## 2022-06-23 NOTE — TOC Progression Note (Signed)
Transition of Care [facility]) - Initial/Assessment Note    Patient Details  Name: Jody Simpson MRN: [id number] Date of Birth: 1950/10/13  Transition of Care [facility]) CM/SW Contact:    Milinda Antis, [place] Phone Number: 06/23/2022, 11:10 AM  Clinical Narrative:                 LCSW met with the patient at bedside to present bed offers. The patient reports that she would rather go back to her apartment than go to the facilities available.   LCSW encouraged patient to weigh options of SNF vs home health as the patient reports that she has no one to "stay" with her at the apartment.  TOC following.    Expected Discharge Plan: [place] Barriers to Discharge: Continued Medical Work up   Patient Goals and CMS Choice Patient states their goals for this hospitalization and ongoing recovery are:: To return home CMS Medicare.gov Compare Post Acute Care list provided to:: Patient Choice offered to / list presented to : Patient      Expected Discharge Plan and Services   Discharge Planning Services: CM Consult Post Acute Care Choice: [place] arrangements for the past 2 months: [place]                 DME Arranged: N/A DME Agency: NA       HH Arranged: PT, OT [place] Agency: [place] Date [place]: 06/16/22 Time HH Agency Contacted: 75 Representative spoke with at [place]: Tommi Rumps  Prior Living Arrangements/Services Living arrangements for the past 2 months: Materials engineer Lives with:: Self Patient language and need for interpreter reviewed:: Yes Do you feel safe going back to the place where you live?: Yes      Need for Family Participation in Patient Care: Yes (Comment) Care giver support system in place?: Yes (comment) Current home services: DME ([facility], w/c handicapped bathroom) Criminal Activity/Legal Involvement Pertinent to Current Situation/Hospitalization: No - Comment as  needed  Activities of Daily Living Home Assistive Devices/Equipment: Environmental consultant (specify type) ADL Screening (condition at time of admission) Patient's cognitive ability adequate to safely complete daily activities?: Yes Is the patient deaf or have difficulty hearing?: No Does the patient have difficulty seeing, even when wearing glasses/contacts?: No Does the patient have difficulty concentrating, remembering, or making decisions?: No Patient able to express need for assistance with ADLs?: Yes Does the patient have difficulty dressing or bathing?: No Independently performs ADLs?: Yes (appropriate for developmental age) Does the patient have difficulty walking or climbing stairs?: Yes Weakness of Legs: Both Weakness of Arms/Hands: None  Permission Sought/Granted Permission sought to share information with : Case Manager, Customer service manager, Family Supports Permission granted to share information with : Yes, Verbal Permission Granted              Emotional Assessment Appearance:: Appears stated age Attitude/Demeanor/Rapport: Engaged, Gracious Affect (typically observed): Accepting, Hopeful, Appropriate, Pleasant   Alcohol / Substance Use: Not Applicable Psych Involvement: No (comment)  Admission diagnosis:  Hypoxia [R09.02] Acute UTI [N39.0] AKI (acute kidney injury) ([place]) [N17.9] Sepsis secondary to UTI ([place]) [A41.9, N39.0] Influenza [J11.1] Sepsis, due to unspecified organism, unspecified whether acute organ dysfunction present [facility]) [A41.9] Patient Active Problem List   Diagnosis Date Noted   Sepsis secondary to UTI ([place]) 06/15/2022   CAP (community acquired pneumonia) 07/03/2016   Obesity-BMI 51 07/03/2016   Fall-down at home x 48 hrs 07/03/2016   PAT (  paroxysmal atrial tachycardia) 07/03/2016   COLONIC POLYPS 09/08/2007   Gout 09/08/2007   GASTROESOPHAGEAL REFLUX DISEASE 09/08/2007   IRRITABLE BOWEL SYNDROME 09/08/2007   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/28/2006   Hyperlipemia 07/28/2006   SOLITARY KIDNEY 07/28/2006   Diabetes mellitus, type 2 ([place]) 07/21/2006   Essential hypertension 07/21/2006   DERMATITIS, ATOPIC 07/21/2006   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 [place] [address] Phone: [phone] Fax: [phone]     Social Determinants of Health (SDOH) Social History: [place]: No Food Insecurity (06/15/2022)  Housing: Low Risk  (06/15/2022)  Transportation Needs: No Transportation Needs (06/15/2022)  Utilities: Not At Risk (06/15/2022)  Tobacco Use: Low Risk  (06/15/2022)   SDOH Interventions: Transportation Interventions: Intervention Not Indicated, Inpatient TOC, Patient Resources (Friends/Family)   Readmission Risk Interventions     No data to display

## 2022-06-23 NOTE — Progress Notes (Signed)
Physical Therapy Treatment Patient Details Name: Jody Simpson MRN: [id number] DOB: 07-16-1950 Today's Date: 06/23/2022   History of Present Illness Jody Simpson is a 72 y.o. female with medical history significant for SVT, essential hypertension, hyperlipidemia, GERD, type 2 diabetes, who presented to [facility] ED from independent living facility via EMS due to generalized weakness. She slid out of her chair. Admitted for sepsis secondary to UTI and + flu.    PT Comments    Continuing work on functional mobility and activity tolerance;  Session focused on functional transfers, with efforts to more approximate her apartment (bed to same height, seat of recliner bolstered to be higher like a lift chair); Lengthy conversation re: managing at home in her apt, and what she must be able to do in order to dc home; Requested pt see about getting her wheelchair brought in to practice with;   Noteworthy improvement in bed mobility, heavy use of rails, but no need for physical assist; Pt still needs mod assist for sit to stand and step pivot transfers; limited by bouts of SOB during session; Continue to recommend post-acute rehab to maximize independence and safety with mobility and ADLs prio to getting home  Recommendations for follow up therapy are one component of a multi-disciplinary discharge planning process, led by the attending physician.  Recommendations may be updated based on patient status, additional functional criteria and insurance authorization.  Follow Up Recommendations  Skilled nursing-short term rehab (<3 hours/day) (Must be modified independent to dc back to independent living apt; Will monitor for progress; at this point, recommend post-acute rehab) Can patient physically be transported by private vehicle: No   Assistance Recommended at Discharge Intermittent Supervision/Assistance  Patient can return home with the following A lot of help with walking and/or transfers;Help with stairs  or ramp for entrance;Assist for transportation;A lot of help with bathing/dressing/bathroom   Equipment Recommendations  None recommended by PT    Recommendations for Other Services OT consult     Precautions / Restrictions Precautions Precautions: Fall Precaution Comments: watch O2 sats     Mobility  Bed Mobility Overal bed mobility: Needs Assistance Bed Mobility: Supine to Sit     Supine to sit: Min guard     General bed mobility comments: Incr time; minguard for safety    Transfers Overall transfer level: Needs assistance Equipment used: Rollator (4 wheels) Transfers: Sit to/from Stand, Bed to chair/wheelchair/BSC Sit to Stand: Mod assist, +2 physical assistance   Step pivot transfers: +2 physical assistance, +2 safety/equipment, Min assist       General transfer comment: Stood from a lower bed to approximate home; 2 unsuccessful tries before successfully standing; heavily dependent on momentum; Once standing to rollator, able to take pivot steps bed to recliner    Ambulation/Gait               General Gait Details: Once in the recliner, offered pt teh opportunity to walk with recliner pushed behind her; pt declined, too fatigued   Stairs             Wheelchair Mobility    Modified Rankin (Stroke Patients Only)       Balance     Sitting balance-[name]: Good     Standing balance support: Bilateral upper extremity supported, During functional activity, Reliant on assistive device for balance Standing balance-[name]: Poor Standing balance comment: reliant on rollator for balance  Cognition Arousal/Alertness: Awake/alert Behavior During Therapy: WFL for tasks assessed/performed Overall Cognitive Status: Within Functional Limits for tasks assessed                                 General Comments: patient more aware of needs and now considering SNF for continued rehab         Exercises      General Comments General comments (skin integrity, edema, etc.): Session conducted on room air; pt experienced a few bouts of SOB and difficulty cathcing her breath throughout session; O2 sats 96%, HR 96bpm end of session; elevated set height with bedspreads to approximate her recliner at home      Pertinent Vitals/Pain Pain Assessment Pain Assessment: Faces Faces Pain Scale: Hurts a little bit Pain Location: back Pain Descriptors / Indicators: Grimacing Pain Intervention(s): Monitored during session    Home Living                          Prior Function            PT Goals (current goals can now be found in the care plan section) Acute Rehab PT Goals Patient Stated Goal: would like to return to her independent living apt Time For Goal Achievement: 06/30/22 Potential to Achieve Goals: Fair Progress towards PT goals: Progressing toward goals (slowly)    Frequency    Min 2X/week      PT Plan Current plan remains appropriate;Frequency needs to be updated    Co-evaluation              AM-PAC PT "6 Clicks" Mobility   Outcome Measure  Help needed turning from your back to your side while in a flat bed without using bedrails?: A Lot Help needed moving from lying on your back to sitting on the side of a flat bed without using bedrails?: A Little Help needed moving to and from a bed to a chair (including a wheelchair)?: A Lot Help needed standing up from a chair using your arms (e.g., wheelchair or bedside chair)?: A Lot Help needed to walk in hospital room?: Total Help needed climbing 3-5 steps with a railing? : Total 6 Click Score: 11    End of Session Equipment Utilized During Treatment: Gait belt Activity Tolerance: Patient tolerated treatment well (though seems anxious) Patient left: in chair;with call bell/phone within reach;with chair alarm set Nurse Communication: Mobility status PT Visit Diagnosis: Muscle weakness (generalized)  (M62.81);Other abnormalities of gait and mobility (R26.89)     Time: 2542-7062 PT Time Calculation (min) (ACUTE ONLY): 41 min  Charges:  $Therapeutic Activity: 38-52 mins                     Jody Simpson, PT  Acute Rehabilitation Services Office [phone]    Jody Simpson 06/23/2022, 3:13 PM

## 2022-06-24 DIAGNOSIS — N390 Urinary tract infection, site not specified: Secondary | ICD-10-CM | POA: Diagnosis not present

## 2022-06-24 DIAGNOSIS — A419 Sepsis, unspecified organism: Secondary | ICD-10-CM | POA: Diagnosis not present

## 2022-06-24 LAB — BASIC METABOLIC PANEL
Anion gap: 12 (ref 5–15)
BUN: 27 mg/dL — ABNORMAL HIGH (ref 8–23)
CO2: 26 mmol/L (ref 22–32)
Calcium: 7.7 mg/dL — ABNORMAL LOW (ref 8.9–10.3)
Chloride: 101 mmol/L (ref 98–111)
Creatinine, Ser: 1.1 mg/dL — ABNORMAL HIGH (ref 0.44–1.00)
GFR, Estimated: 54 mL/min — ABNORMAL LOW (ref >60)
Glucose, Bld: 145 mg/dL — ABNORMAL HIGH (ref 70–99)
Potassium: 3.9 mmol/L (ref 3.5–5.1)
Sodium: 139 mmol/L (ref 135–145)

## 2022-06-24 LAB — MAGNESIUM: Magnesium: 2 mg/dL (ref 1.7–2.4)

## 2022-06-24 LAB — GLUCOSE, CAPILLARY
Glucose-Capillary: 154 mg/dL — ABNORMAL HIGH (ref 70–99)
Glucose-Capillary: 163 mg/dL — ABNORMAL HIGH (ref 70–99)
Glucose-Capillary: 111 mg/dL — ABNORMAL HIGH (ref 70–99)
Glucose-Capillary: 112 mg/dL — ABNORMAL HIGH (ref 70–99)
Glucose-Capillary: 131 mg/dL — ABNORMAL HIGH (ref 70–99)

## 2022-06-24 MED ORDER — SPIRONOLACTONE 25 MG PO TABS: 25.0000 mg | ORAL_TABLET | Freq: Every day | ORAL | 2 refills | Status: AC

## 2022-06-24 MED ORDER — HYDRALAZINE HCL 100 MG PO TABS: 100.0000 mg | ORAL_TABLET | Freq: Three times a day (TID) | ORAL | 2 refills | Status: DC

## 2022-06-24 MED ORDER — LISINOPRIL 40 MG PO TABS: 40.0000 mg | ORAL_TABLET | Freq: Every day | ORAL | 2 refills | Status: DC

## 2022-06-24 MED ORDER — FUROSEMIDE 20 MG PO TABS: 20.0000 mg | ORAL_TABLET | Freq: Every day | ORAL | 2 refills | Status: DC | PRN

## 2022-06-24 NOTE — Plan of Care (Signed)
  Problem: Education: Goal: Ability to describe self-care measures that may prevent or decrease complications (Diabetes Survival Skills Education) will improve Outcome: Progressing Goal: Individualized Educational Video(s) Outcome: Progressing   Problem: Coping: Goal: Ability to adjust to condition or change in health will improve Outcome: Progressing   Problem: Fluid Volume: Goal: Ability to maintain a balanced intake and output will improve Outcome: Progressing   Problem: Health Behavior/Discharge Planning: Goal: Ability to identify and utilize available resources and services will improve Outcome: Progressing Goal: Ability to manage health-related needs will improve Outcome: Progressing   Problem: Metabolic: Goal: Ability to maintain appropriate glucose levels will improve Outcome: Progressing   Problem: Nutritional: Goal: Maintenance of adequate nutrition will improve Outcome: Progressing Goal: Progress toward achieving an optimal weight will improve Outcome: Progressing   Problem: Skin Integrity: Goal: Risk for impaired skin integrity will decrease Outcome: Progressing   Problem: Tissue Perfusion: Goal: Adequacy of tissue perfusion will improve Outcome: Progressing   Problem: Urinary Elimination: Goal: Signs and symptoms of infection will decrease Outcome: Progressing

## 2022-06-24 NOTE — Progress Notes (Addendum)
PTAR arrived to transport patient for discharge home. Patient was unable to get to edge of bed or stand. Transporter expressed concern for patient safety leaving her home alone and refused to transport. MD [place] made aware.

## 2022-06-24 NOTE — Progress Notes (Signed)
DISCHARGE NOTE HOME Jody Simpson to be discharged Home per MD order. Diagnosis, treatment, prescriptions and follow up appointments discussed  with the patient. Medication list explained in detail. Patient verbalized knowledge and an understanding. Patient advised to return to ED if symptoms return.  Skin clean, dry and intact without evidence of skin break down, no evidence of skin tears noted. IV catheter discontinued intact. Site without signs and symptoms of complications. Pt denies pain at the site currently. No complaints noted.  An After Visit Summary (AVS) was printed and given to the patient. Patient escorted via wheelchair, and discharged home via private auto.  Virgina Jock, RN

## 2022-06-24 NOTE — Progress Notes (Signed)
Patient is discharging to short term rehab.  Levora Dredge RN

## 2022-06-24 NOTE — Progress Notes (Signed)
Occupational Therapy Treatment Patient Details Name: AMILLION SCOBEE MRN: [id number] DOB: [birth year] Today's Date: 06/24/2022   History of present illness Jody Simpson is a 72 y.o. female with medical history significant for SVT, essential hypertension, hyperlipidemia, GERD, type 2 diabetes, who presented to [facility] ED from independent living facility via EMS due to generalized weakness. She slid out of her chair. Admitted for sepsis secondary to UTI and + flu.   OT comments  Patient required increased time and min guard to get to EOB. Patient required frequent rest breaks while seated on EOB to perform grooming and UB dressing. Patient agreed to attempt standing from EOB but required time to attempt and ultimately declined standing and asked to return to supine due to possible discharge back to apartment today. Continue to recommend SNF for discharge due to assistance needed with sit to stands, transfers, and self care but patient has declined and is returning home to receive HHOT/PT.    Recommendations for follow up therapy are one component of a multi-disciplinary discharge planning process, led by the attending physician.  Recommendations may be updated based on patient status, additional functional criteria and insurance authorization.    Follow Up Recommendations  Skilled nursing-short term rehab (<3 hours/day)     Assistance Recommended at Discharge Intermittent Supervision/Assistance  Patient can return home with the following  Assistance with cooking/housework;A lot of help with walking and/or transfers;A lot of help with bathing/dressing/bathroom   Equipment Recommendations  None recommended by OT    Recommendations for Other Services      Precautions / Restrictions Precautions Precautions: Fall Precaution Comments: watch O2 sats Restrictions Weight Bearing Restrictions: No       Mobility Bed Mobility Overal bed mobility: Needs Assistance Bed Mobility: Supine to Sit, Sit  to Supine     Supine to sit: Min guard Sit to supine: Mod assist   General bed mobility comments: increased time to get to EOB. Required assistance with LLE to return to supine    Transfers Overall transfer level: Needs assistance                 General transfer comment: declined standing from EOB     Balance Overall balance assessment: Needs assistance Sitting-balance support: No upper extremity supported, Feet supported Sitting balance-[name]: Good                                     ADL either performed or assessed with clinical judgement   ADL Overall ADL's : Needs assistance/impaired     Grooming: Wash/dry hands;Wash/dry face;Oral care;Set up;Sitting Grooming Details (indicate cue type and reason): on EOB         Upper Body Dressing : Set up;Sitting Upper Body Dressing Details (indicate cue type and reason): donned house gown while seated on EOB                   General ADL Comments: treated on EOB due to expected to discharge today via PTAR    Extremity/Trunk Assessment              Vision       Perception     Praxis      Cognition Arousal/Alertness: Awake/alert Behavior During Therapy: WFL for tasks assessed/performed Overall Cognitive Status: Within Functional Limits for tasks assessed  Exercises      Shoulder Instructions       General Comments      Pertinent Vitals/ Pain       Pain Assessment Pain Assessment: Faces Pain Score: 2  Pain Location: back Pain Descriptors / Indicators: Grimacing Pain Intervention(s): Limited activity within patient's tolerance, Monitored during session, Repositioned  Home Living                                          Prior Functioning/Environment              Frequency  Min 2X/week        Progress Toward Goals  OT Goals(current goals can now be found in the care plan  section)  Progress towards OT goals: Progressing toward goals  Acute Rehab OT Goals Patient Stated Goal: go back to her apartment OT Goal Formulation: With patient Time For Goal Achievement: 06/29/22 Potential to Achieve Goals: Good ADL Goals Pt Will Perform Lower Body Dressing: with supervision;sit to/from stand Pt Will Transfer to Toilet: with supervision;ambulating;regular height toilet;grab bars;bedside commode Pt Will Perform Toileting - Clothing Manipulation and hygiene: with supervision;sit to/from stand  Plan Discharge plan remains appropriate    Co-evaluation                 AM-PAC OT "6 Clicks" Daily Activity     Outcome Measure   Help from another person eating meals?: None Help from another person taking care of personal grooming?: A Little Help from another person toileting, which includes using toliet, bedpan, or urinal?: A Lot Help from another person bathing (including washing, rinsing, drying)?: A Lot Help from another person to put on and taking off regular upper body clothing?: A Little Help from another person to put on and taking off regular lower body clothing?: A Lot 6 Click Score: 16    End of Session    OT Visit Diagnosis: Muscle weakness (generalized) (M62.81)   Activity Tolerance Patient tolerated treatment well   Patient Left in bed;with call bell/phone within reach;with bed alarm set   Nurse Communication Mobility status        Time: 8338-2505 OT Time Calculation (min): 48 min  Charges: OT General Charges $OT Visit: 1 Visit OT Treatments $Self Care/Home Management : 23-37 mins $Therapeutic Activity: 8-22 mins  Lodema Hong, OTA Acute Rehabilitation Services  Office [phone]   Trixie Dredge 06/24/2022, 11:04 AM

## 2022-06-24 NOTE — Plan of Care (Signed)
  Problem: Education: Goal: Ability to describe self-care measures that may prevent or decrease complications (Diabetes Survival Skills Education) will improve 06/24/2022 1124 by Virgina Jock, RN Outcome: Adequate for Discharge 06/24/2022 0910 by Virgina Jock, RN Outcome: Progressing Goal: Individualized Educational Video(s) 06/24/2022 1124 by Virgina Jock, RN Outcome: Adequate for Discharge 06/24/2022 0910 by Virgina Jock, RN Outcome: Progressing   Problem: Coping: Goal: Ability to adjust to condition or change in health will improve 06/24/2022 1124 by Virgina Jock, RN Outcome: Adequate for Discharge 06/24/2022 0910 by Virgina Jock, RN Outcome: Progressing   Problem: Fluid Volume: Goal: Ability to maintain a balanced intake and output will improve 06/24/2022 1124 by Virgina Jock, RN Outcome: Adequate for Discharge 06/24/2022 0910 by Virgina Jock, RN Outcome: Progressing   Problem: Health Behavior/Discharge Planning: Goal: Ability to identify and utilize available resources and services will improve 06/24/2022 1124 by Virgina Jock, RN Outcome: Adequate for Discharge 06/24/2022 0910 by Virgina Jock, RN Outcome: Progressing Goal: Ability to manage health-related needs will improve 06/24/2022 1124 by Virgina Jock, RN Outcome: Adequate for Discharge 06/24/2022 0910 by Virgina Jock, RN Outcome: Progressing   Problem: Metabolic: Goal: Ability to maintain appropriate glucose levels will improve 06/24/2022 1124 by Virgina Jock, RN Outcome: Adequate for Discharge 06/24/2022 0910 by Virgina Jock, RN Outcome: Progressing   Problem: Nutritional: Goal: Maintenance of adequate nutrition will improve 06/24/2022 1124 by Virgina Jock, RN Outcome: Adequate for Discharge 06/24/2022 0910 by Virgina Jock, RN Outcome: Progressing Goal: Progress toward achieving an optimal weight will improve 06/24/2022 1124 by Virgina Jock, RN Outcome: Adequate for Discharge 06/24/2022  0910 by Virgina Jock, RN Outcome: Progressing   Problem: Skin Integrity: Goal: Risk for impaired skin integrity will decrease 06/24/2022 1124 by Virgina Jock, RN Outcome: Adequate for Discharge 06/24/2022 0910 by Virgina Jock, RN Outcome: Progressing   Problem: Tissue Perfusion: Goal: Adequacy of tissue perfusion will improve 06/24/2022 1124 by Virgina Jock, RN Outcome: Adequate for Discharge 06/24/2022 0910 by Virgina Jock, RN Outcome: Progressing   Problem: Urinary Elimination: Goal: Signs and symptoms of infection will decrease 06/24/2022 1124 by Virgina Jock, RN Outcome: Adequate for Discharge 06/24/2022 0910 by Virgina Jock, RN Outcome: Progressing

## 2022-06-24 NOTE — TOC Transition Note (Signed)
Transition of Care [facility]) - CM/SW Discharge Note   Patient Details  Name: Jody Simpson MRN: [id number] Date of Birth: 08-18-1950  [facility]) CM/SW Contact:  Tom-Johnson, Renea Ee, RN Phone Number: 06/24/2022, 1:00 PM   Clinical Narrative:     Patient is scheduled for discharge today. Home health info on AVS. CM notified by RN that patient's sister in-law informed her that patient's apartment is condemned. Patient is staying at [facility]. CM spoke with patient about her apartment being condemned and patient denied it being condemned. Gave CM permission to verify with facility. CM called [facility] [phone]) and spoke with Elmyra Ricks. Elmyra Ricks states patient's apartment is not condemned but her living condition is questionable. CM spoke with patient about her living condition and patient states her apartment is fine and livable and wants to return.  [facility] scheduled for discharge transportation. No further TOC needs noted.      Final next level of care: Home w Home Health Services Barriers to Discharge: Barriers Resolved   Patient Goals and CMS Choice CMS Medicare.gov Compare Post Acute Care list provided to:: Patient Choice offered to / list presented to : Patient  Discharge Placement                  Patient to be transferred to facility by: [place]      Discharge Plan and Services Additional resources added to the After Visit Summary for     Discharge Planning Services: CM Consult Post Acute Care Choice: Home Health          DME Arranged: N/A DME Agency: NA       HH Arranged: PT, OT HH Agency: [place] Date [place]: 06/16/22 Time [place]: [id number] Representative spoke with at [place]: [place] Determinants of Health ([place]) Interventions SDOH Screenings   Food Insecurity: No Food Insecurity (06/15/2022)  Housing: Low Risk  (06/15/2022)  Transportation  Needs: No Transportation Needs (06/15/2022)  Utilities: Not At Risk (06/15/2022)  Tobacco Use: Low Risk  (06/15/2022)     Readmission Risk Interventions     No data to display

## 2022-06-24 NOTE — Discharge Summary (Signed)
Physician Discharge Summary  GAYATHRI FUTRELL NAT:[id number] DOB: March 09, 1951 DOA: 06/15/2022  PCP: Jinny Blossom, [facility]  Admit date: 06/15/2022 Discharge date: 06/24/2022  Admitted From: [facility] independent [facility] facility Disposition: [facility] independent living facility  Recommendations for Outpatient Follow-up:  Follow up with PCP in 1-2 weeks Follow-up with cardiology, Dr. Audie Box in 1-2 weeks Discontinue atenolol, need to avoid all AV nodal blocking agents for tachybradycardia syndrome Started on lisinopril 40 mg p.o. daily, hydralazine 100 mg p.o. 3 times daily, spironolactone 25 mg p.o. daily, and furosemide 20 mg p.o. daily as needed for lower extremity edema Please obtain BMP in one week to reassess potassium and renal function  Patient with high bounce back potential given her overall weakness and debility, she was instructed for SNF placement by multiple providers and therapists during hospitalization but she ultimately declined placement.  If patient returns to the ED for evaluation and no acute medical problems identified, recommend placement from the emergency department and no need for acute hospitalization.  Home Health: PT/OT/aide/RN/social work (declined SNF placement) Equipment/Devices: None  Discharge Condition: Stable CODE STATUS: Full code Diet recommendation: Heart healthy/consistent carbohydrate diet  History of present illness:  NAN MAYA is a 72 y.o. female with past medical history significant for HTN, HLD, SVT, DM2, GERD who presented to [facility] ED on 1/14 from independent living facility via EMS due to generalized weakness.  Patient endorsed feeling so weak that she slid out of her chair and ended up on the floor.  Reports was diagnosed with influenza Friday prior to admission.  At the facility, they cannot get her up per their policy.  Reportedly, per ILF staff patient was unresponsive on the floor and EMS was called.  On EMS arrival, vital signs  notable for soft BPs, bradycardia with heart rate of 50.   In the ED, due to concern for sepsis with leukocytosis, urine culture and blood cultures obtained.  Patient was started on Rocephin empirically.  Tamiflu was started.  TRH consulted for admission for further evaluation and management.  Hospital course:  Sepsis, POA Enterococcus/E. coli UTI Patient presenting to the ED with fever, temperature 101.2 F, leukocytosis 12.5.  Urinalysis consistent with UTI.  Urine culture positive for Enterococcus and E. coli.  Leukocytosis now resolved.  Completed 5-day course of ceftriaxone.   Generalized weakness, deconditioning, debility Seen by PT and OT with recommendations of SNF placement.  Patient was initially amenable for SNF placement but ultimately declined at time of discharge.  Discharging with home health.  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  Case was discussed with electrophysiology who recommended outpatient monitoring and follow-up.  Plan for 7-day Zio patch at time of discharge with outpatient follow-up with Dr. Davina Poke 4-6 weeks.Avoid all AV nodal blocking agents   Acute on chronic diastolic congestive heart failure exacerbation Essential hypertension Cardiology was consulted and followed during hospital course.  Patient was supported with IV furosemide and then discontinued by cardiology.  TTE with LVEF 60 to 65%. Continue Aldactone 25 mg p.o. daily, Lisinopril 40 mg p.o. daily, Hydralazine 100 mg p.o. every 8 hours,  Furosemide 20 mg p.o. daily as needed for lower extremity edema. Outpatient follow-up with cardiology   Elevated troponin Etiology likely secondary to acute diastolic congestive heart failure exacerbation also with mild rhabdomyolysis.  Denies chest pain.  Echo with normal LV function with no  wall motion  abnormality.  EKG nonspecific.  LDL 78, close to goal.  Not consistent with acute coronary syndrome.  Cardiology was consulted with no need for further workup at this time.  Patient has allergies to statin and aspirin.  Plan outpatient follow-up with cardiology.   Acute renal failure Patient presenting with a creatinine of 1.3, baseline 1.0.  Etiology likely secondary to prerenal azotemia in setting of dehydration.  Supported with IV fluid hydration with improvement of creatinine to 1.10 at discharge.  Recommend repeat BMP 1 week.   Type 2 diabetes mellitus with hyperglycemia He will go A1c 8.2, poorly controlled.  On glipizide 10 mg p.o. twice daily, Soliqua 28u [place] daily and insulin aspart 20 units twice daily before a meal at home.  Outpatient follow-up with [facility].   Questionable syncope Per ILF staff, patient was not responsive on the floor.  TTE with LVEF 60 to 65%, no regional wall motion normalities, mild LVH, mild aortic valve stenosis, IVC normal in size.   Elevated CPK CPK elevated 1000 on arrival.  Supported with IV fluid hydration.   Isolated hyperbilirubinemia AST, ALT and alkaline phosphatase normal.  Total bilirubin 1.5 at presentation, now resolved following IV fluid hydration.   Hypomagnesemia Hypokalemia Repleted during hospitalization.  Repeat BMP 1 week.   Morbid obesity Body mass index is 51.45 kg/m.  Discussed with patient needs for aggressive lifestyle changes/weight loss as this complicates all facets of care.  Outpatient follow-up with PCP.      Pressure Injury 06/15/22 Buttocks Right Stage 2 -  Partial thickness loss of dermis presenting as a shallow open injury with a red, pink wound bed without slough. (Active)  06/15/22 1427  Location: Buttocks  Location Orientation: Right  Staging: Stage 2 -  Partial thickness loss of dermis presenting as a shallow open injury with a red, pink wound bed without slough.  Wound Description (Comments):   Present on Admission:  Yes    Discharge Diagnoses:  Principal Problem:   Sepsis secondary to UTI [facility])    Discharge Instructions  Discharge Instructions     Call MD for:  difficulty breathing, headache or visual disturbances   Complete by: As directed    Call MD for:  extreme fatigue   Complete by: As directed    Call MD for:  persistant dizziness or light-headedness   Complete by: As directed    Call MD for:  persistant nausea and vomiting   Complete by: As directed    Call MD for:  severe uncontrolled pain   Complete by: As directed    Call MD for:  temperature >100.4   Complete by: As directed    Diet - low sodium heart healthy   Complete by: As directed    Discharge wound care:   Complete by: As directed    Continue Unna boots changed weekly   Increase activity slowly   Complete by: As directed       Allergies as of 06/24/2022       Reactions   Aspirin    REACTION: stomach irritation   Banana    Cholestyramine    REACTION: Diarrhea and nausea   Codeine    REACTION: stomach irritant   Diazepam    REACTION: hyper   Hydrocodone    Ibuprofen Other (See Comments)   Iodine    Latex    Meperidine Hcl    Metformin    REACTION: Diarrhea   Naproxen Sodium    REACTION: stomach irritant  Penicillins    Pravastatin Sodium    REACTION: Flu like symptoms   Sitagliptin Phosphate    REACTION: Caused elevated CBGs   Tape Rash   Tegaderm transparent plastic film        Medication List     STOP taking these medications    atenolol 50 MG tablet Commonly known as: TENORMIN   lisinopril-hydrochlorothiazide 20-25 MG tablet Commonly known as: ZESTORETIC       TAKE these medications    acetaminophen 325 MG tablet Commonly known as: TYLENOL Take 650 mg by mouth every 4 (four) hours as needed for mild pain.   allopurinol 300 MG tablet Commonly known as: ZYLOPRIM Take 300 mg by mouth daily.   baclofen 10 MG tablet Commonly known as: LIORESAL Take 10 mg by mouth daily as  needed for muscle spasms.   benzonatate 100 MG capsule Commonly known as: TESSALON Take 100 mg by mouth 3 (three) times daily as needed for cough.   famotidine 20 MG tablet Commonly known as: PEPCID Take 20 mg by mouth 2 (two) times daily.   fluticasone 50 MCG/ACT nasal spray Commonly known as: FLONASE Place 2 sprays into both nostrils daily as needed for allergies.   glipiZIDE 10 MG tablet Commonly known as: GLUCOTROL Take 10 mg by mouth 2 (two) times daily.   hydrALAZINE 100 MG tablet Commonly known as: APRESOLINE Take 1 tablet (100 mg total) by mouth every 8 (eight) hours.   Insulin Aspart FlexPen 100 UNIT/ML Commonly known as: NOVOLOG Inject 28 Units into the skin 2 (two) times daily before a meal.   lisinopril 40 MG tablet Commonly known as: ZESTRIL Take 1 tablet (40 mg total) by mouth daily. Start taking on: June 25, 2022   loratadine 10 MG tablet Commonly known as: CLARITIN Take 10 mg by mouth daily.   montelukast 10 MG tablet Commonly known as: SINGULAIR Take 10 mg by mouth at bedtime.   Soliqua 100-33 UNT-MCG/ML Sopn Generic drug: Insulin Glargine-Lixisenatide Inject 28 Units into the skin daily.   spironolactone 25 MG tablet Commonly known as: ALDACTONE Take 1 tablet (25 mg total) by mouth daily. Start taking on: June 25, 2022   Ventolin HFA 108 (90 Base) MCG/ACT inhaler Generic drug: albuterol Inhale 2 puffs into the lungs every 4 (four) hours as needed for wheezing or shortness of breath.               Discharge Care Instructions  (From admission, onward)           Start     Ordered   06/24/22 0000  Discharge wound care:       Comments: Continue Unna boots changed weekly   06/24/22 1006            Follow-up Information     Equiptment, [facility]. Schedule an appointment as soon as possible for a visit in 1 week(s).   Contact information: [address] [place] [address] [phone]         Geralynn Rile, MD. Schedule an appointment as soon as possible for a visit.   Specialties: Cardiology, Internal Medicine, Radiology Contact information: [place] [address] [phone]                Allergies  Allergen Reactions   Aspirin     REACTION: stomach irritation   Banana    Cholestyramine     REACTION: Diarrhea and nausea   Codeine  REACTION: stomach irritant   Diazepam     REACTION: hyper   Hydrocodone    Ibuprofen Other (See Comments)   Iodine    Latex    Meperidine Hcl    Metformin     REACTION: Diarrhea   Naproxen Sodium     REACTION: stomach irritant   Penicillins    Pravastatin Sodium     REACTION: Flu like symptoms   Sitagliptin Phosphate     REACTION: Caused elevated CBGs   Tape Rash    Tegaderm transparent plastic film    Consultations: Cardiology   Procedures/Studies: ECHOCARDIOGRAM COMPLETE  Result Date: 06/16/2022    ECHOCARDIOGRAM REPORT   Patient Name:   AREANNA GENGLER Branca Date of Exam: 06/16/2022 Medical Rec #:  [id number]       Height:       67.0 in Accession #:    [id number]      Weight:       300.0 lb Date of Birth:  14-Jan-1951       BSA:          2.403 m Patient Age:    72 years        BP:           151/68 mmHg Patient Gender: F               HR:           55 bpm. Exam Location:  Inpatient Procedure: 2D Echo, Color Doppler and Cardiac Doppler Indications:    elevated troponin  History:        Patient has no prior history of Echocardiogram examinations.                 Risk Factors:Diabetes, Hypertension and Dyslipidemia.  Sonographer:    Phineas Douglas Referring Phys: [id number] [place]  1. Left ventricular ejection fraction, by estimation, is 60 to 65%. The left ventricle has normal function. The left ventricle has no regional wall motion abnormalities. There is mild left ventricular hypertrophy. Left ventricular diastolic parameters are indeterminate.  2. Right ventricular systolic function is normal.  The right ventricular size is normal. There is moderately elevated pulmonary artery systolic pressure.  3. Left atrial size was moderately dilated.  4. The mitral valve is degenerative. Mild mitral valve regurgitation. No evidence of mitral stenosis. Severe mitral annular calcification.  5. The aortic valve was not well visualized. There is moderate calcification of the aortic valve. There is moderate thickening of the aortic valve. Aortic valve regurgitation is not visualized. Mild aortic valve stenosis.  6. The inferior vena cava is normal in size with greater than 50% respiratory variability, suggesting right atrial pressure of 3 mmHg. FINDINGS  Left Ventricle: Left ventricular ejection fraction, by estimation, is 60 to 65%. The left ventricle has normal function. The left ventricle has no regional wall motion abnormalities. The left ventricular internal cavity size was normal in size. There is  mild left ventricular hypertrophy. Left ventricular diastolic parameters are indeterminate. Right Ventricle: The right ventricular size is normal. No increase in right ventricular wall thickness. Right ventricular systolic function is normal. There is moderately elevated pulmonary artery systolic pressure. The tricuspid regurgitant velocity is 3.12 m/s, and with an assumed right atrial pressure of 15 mmHg, the estimated right ventricular systolic pressure is 66.2 mmHg. Left Atrium: Left atrial size was moderately dilated. Right Atrium: Right atrial size was normal in size. Pericardium: There is no evidence of pericardial effusion. Mitral Valve: The mitral  valve is degenerative in appearance. There is severe thickening of the mitral valve leaflet(s). There is severe calcification of the mitral valve leaflet(s). Severe mitral annular calcification. Mild mitral valve regurgitation. No evidence of mitral valve stenosis. Tricuspid Valve: The tricuspid valve is normal in structure. Tricuspid valve regurgitation is mild . No  evidence of tricuspid stenosis. Aortic Valve: The aortic valve was not well visualized. There is moderate calcification of the aortic valve. There is moderate thickening of the aortic valve. Aortic valve regurgitation is not visualized. Mild aortic stenosis is present. Aortic valve mean gradient measures 12.5 mmHg. Aortic valve peak gradient measures 22.0 mmHg. Aortic valve area, by VTI measures 1.66 cm. Pulmonic Valve: The pulmonic valve was normal in structure. Pulmonic valve regurgitation is trivial. No evidence of pulmonic stenosis. Aorta: The aortic root is normal in size and structure. Venous: The inferior vena cava is normal in size with greater than 50% respiratory variability, suggesting right atrial pressure of 3 mmHg. IAS/Shunts: No atrial level shunt detected by color flow Doppler.  LEFT VENTRICLE PLAX 2D LVIDd:         5.20 cm      Diastology LVIDs:         3.20 cm      LV e' medial:    8.49 cm/s LV PW:         1.30 cm      LV E/e' medial:  25.0 LV IVS:        1.20 cm      LV e' lateral:   11.00 cm/s LVOT diam:     1.90 cm      LV E/e' lateral: 19.3 LV SV:         75 LV SV Index:   31 LVOT Area:     2.84 cm  LV Volumes (MOD) LV vol d, MOD A2C: 125.0 ml LV vol d, MOD A4C: 137.0 ml LV vol s, MOD A2C: 49.2 ml LV vol s, MOD A4C: 45.9 ml LV SV MOD A2C:     75.8 ml LV SV MOD A4C:     137.0 ml LV SV MOD BP:      85.5 ml RIGHT VENTRICLE          IVC RV Basal diam:  4.40 cm  IVC diam: 2.90 cm TAPSE (M-mode): 2.0 cm LEFT ATRIUM             Index        RIGHT ATRIUM           Index LA diam:        4.70 cm 1.96 cm/m   RA Area:     18.30 cm LA Vol (A2C):   70.4 ml 29.30 ml/m  RA Volume:   46.70 ml  19.44 ml/m LA Vol (A4C):   76.3 ml 31.76 ml/m LA Biplane Vol: 73.5 ml 30.59 ml/m  AORTIC VALVE                     PULMONIC VALVE AV Area (Vmax):    1.69 cm      PR End Diast Vel: 6.25 msec AV Area (Vmean):   1.79 cm AV Area (VTI):     1.66 cm AV Vmax:           234.75 cm/s AV Vmean:          163.500 cm/s AV  VTI:            0.452 m AV Peak Grad:  22.0 mmHg AV Mean Grad:      12.5 mmHg LVOT Vmax:         140.00 cm/s LVOT Vmean:        103.000 cm/s LVOT VTI:          0.265 m LVOT/AV VTI ratio: 0.59  AORTA Ao Root diam: 3.00 cm Ao Asc diam:  3.20 cm MITRAL VALVE                TRICUSPID VALVE MV Area (PHT): 3.79 cm     TR Peak grad:   38.9 mmHg MV Decel Time: 200 msec     TR Vmax:        312.00 cm/s MR Peak grad: 118.8 mmHg MR Mean grad: 80.0 mmHg     SHUNTS MR Vmax:      545.00 cm/s   Systemic VTI:  0.26 m MR Vmean:     431.0 cm/s    Systemic Diam: 1.90 cm MV E velocity: 212.00 cm/s Jenkins Rouge MD Electronically signed by Jenkins Rouge MD Signature Date/Time: 06/16/2022/8:47:50 AM    Final    CT Head Wo Contrast  Result Date: 06/15/2022 CLINICAL DATA:  Head trauma, minor (Age >= 65y) EXAM: CT HEAD WITHOUT CONTRAST TECHNIQUE: Contiguous axial images were obtained from the base of the skull through the vertex without intravenous contrast. RADIATION DOSE REDUCTION: This exam was performed according to the departmental dose-optimization program which includes automated exposure control, adjustment of the mA and/or kV according to patient size and/or use of iterative reconstruction technique. COMPARISON:  None Available. FINDINGS: Brain: No evidence of large-territorial acute infarction. No parenchymal hemorrhage. No mass lesion. No extra-axial collection. No mass effect or midline shift. No hydrocephalus. Basilar cisterns are patent. Vascular: No hyperdense vessel. Skull: No acute fracture or focal lesion. Sinuses/Orbits: Paranasal sinuses and mastoid air cells are clear. Left lens replacement. Otherwise the orbits are unremarkable. Other: None. IMPRESSION: No acute intracranial abnormality. Electronically Signed   By: Iven Finn M.D.   On: 06/15/2022 02:28   DG Chest Port 1 View  Result Date: 06/15/2022 CLINICAL DATA:  Questionable sepsis. EXAM: PORTABLE CHEST 1 VIEW COMPARISON:  AP Lat 07/04/2016. FINDINGS:  The heart is moderately enlarged. There is mild central vascular prominence without overt edema. The lungs are clear of infiltrates. There mild chronic interstitial changes. The mediastinum is normally outlined. There is calcification in the aortic arch. Degenerative changes and slight dextroscoliosis thoracic spine. IMPRESSION: 1. Cardiomegaly with mild central vascular prominence but no overt edema. 2. No other evidence of acute chest disease.  Chronic change. Electronically Signed   By: Telford Nab M.D.   On: 06/15/2022 01:38   DG Pelvis Portable  Result Date: 06/15/2022 CLINICAL DATA:  Fall EXAM: PORTABLE PELVIS 1-2 VIEWS COMPARISON:  04/09/2010 FINDINGS: Symmetric degenerative changes in the hips. No acute bony abnormality. Specifically, no fracture, subluxation, or dislocation. IMPRESSION: No acute bony abnormality. Electronically Signed   By: Rolm Baptise M.D.   On: 06/15/2022 01:35     Subjective: Patient seen examined bedside, resting calmly.  Lying in bed.  Patient declined SNF placement yesterday, discussed with patient that she will be discharged back to her independent living facility with home health.  Patient with no other complaints or concerns at this time.  Denies headache, no dizziness, no chest pain, no palpitations, no shortness of breath, no abdominal pain.  No acute events overnight per nursing staff.  Discussed with patient that she is really weak and would prefer SNF  placement but she declines at this time.  Discharge Exam: Vitals:   06/24/22 0600 06/24/22 0829  BP: (!) 165/77 130/78  Pulse: (!) 106 (!) 105  Resp: 18 16  Temp: 97.8 F (36.6 C) 98.3 F (36.8 C)  SpO2: 92% 91%   Vitals:   06/23/22 1618 06/23/22 2012 06/24/22 0600 06/24/22 0829  BP: (!) 163/109 (!) 167/69 (!) 165/77 130/78  Pulse: 100 81 (!) 106 (!) 105  Resp: (!) '22 20 18 16  '$ Temp: 97.8 F (36.6 C) (!) 97.5 F (36.4 C) 97.8 F (36.6 C) 98.3 F (36.8 C)  TempSrc:  Oral Oral Oral  SpO2: 97%   92% 91%  Weight:      Height:        Physical Exam: GEN: NAD, alert and oriented x 3, obese, appears older than stated age, chronically ill in appearance HEENT: NCAT, PERRL, EOMI, sclera clear, MMM PULM: CTAB w/o wheezes/crackles, normal respiratory effort, on room air CV: RRR w/o M/G/R GI: abd soft, NTND, NABS, no R/G/M MSK: Nonpitting bilateral peripheral edema to lower extremities with Unna boots in place, muscle strength globally intact 5/5 bilateral upper/lower extremities NEURO: CN II-XII intact, no focal deficits, sensation to light touch intact PSYCH: normal mood/affect Integumentary: Chronic skin changes noted to bilateral lower extremities with nonpitting edema with Unna boots in place, otherwise no other concerning rashes/lesions/wounds noted on exposed skin surfaces     The results of significant diagnostics from this hospitalization (including imaging, microbiology, ancillary and laboratory) are listed below for reference.     Microbiology: Recent Results (from the past 240 hour(s))  Blood Culture (routine x 2)     Status: None   Collection Time: 06/15/22  1:30 AM   Specimen: BLOOD  Result Value Ref Range Status   Specimen Description BLOOD LEFT ANTECUBITAL  Final   Special Requests   Final    BOTTLES DRAWN AEROBIC AND ANAEROBIC Blood Culture results may not be optimal due to an excessive volume of blood received in culture bottles   Culture   Final    NO GROWTH 5 DAYS Performed at [facility], [place] [address] [place], [place] [address]    Report Status 06/20/2022 FINAL  Final  Blood Culture (routine x 2)     Status: None   Collection Time: 06/15/22  1:30 AM   Specimen: BLOOD  Result Value Ref Range Status   Specimen Description BLOOD RIGHT ANTECUBITAL  Final   Special Requests   Final    BOTTLES DRAWN AEROBIC AND ANAEROBIC Blood Culture results may not be optimal due to an excessive volume of blood received in culture bottles   Culture   Final    NO  GROWTH 5 DAYS Performed at [facility], [place] [address], [place], [place] [address]    Report Status 06/20/2022 FINAL  Final  Resp panel by RT-PCR (RSV, Flu A&B, Covid) Anterior Nasal Swab     Status: Abnormal   Collection Time: 06/15/22  1:30 AM   Specimen: Anterior Nasal Swab  Result Value Ref Range Status   SARS Coronavirus 2 by RT PCR NEGATIVE NEGATIVE Final    Comment: (NOTE) SARS-CoV-2 target nucleic acids are NOT DETECTED.  The SARS-CoV-2 RNA is generally detectable in upper respiratory specimens during the acute phase of infection. The lowest concentration of SARS-CoV-2 viral copies this assay can detect is 138 copies/mL. A negative result does not preclude SARS-Cov-2 infection and should not be used as the sole basis for  treatment or other patient management decisions. A negative result may occur with  improper specimen collection/handling, submission of specimen other than nasopharyngeal swab, presence of viral mutation(s) within the areas targeted by this assay, and inadequate number of viral copies(<138 copies/mL). A negative result must be combined with clinical observations, patient history, and epidemiological information. The expected result is Negative.  Fact Sheet for Patients:  EntrepreneurPulse.com.au  Fact Sheet for Healthcare Providers:  IncredibleEmployment.be  This test is no t yet approved or cleared by the [place] FDA and  has been authorized for detection and/or diagnosis of SARS-CoV-2 by FDA under an Emergency Use Authorization (EUA). This EUA will remain  in effect (meaning this test can be used) for the duration of the COVID-19 declaration under Section 564(b)(1) of the Act, 21 U.S.C.section 360bbb-3(b)(1), unless the authorization is terminated  or revoked sooner.       Influenza A by PCR POSITIVE (A) NEGATIVE Final   Influenza B by PCR NEGATIVE NEGATIVE Final    Comment: (NOTE) The Xpert Xpress  SARS-CoV-2/FLU/RSV plus assay is intended as an aid in the diagnosis of influenza from Nasopharyngeal swab specimens and should not be used as a sole basis for treatment. Nasal washings and aspirates are unacceptable for Xpert Xpress SARS-CoV-2/FLU/RSV testing.  Fact Sheet for Patients: EntrepreneurPulse.com.au  Fact Sheet for Healthcare Providers: IncredibleEmployment.be  This test is not yet approved or cleared by the [place] FDA and has been authorized for detection and/or diagnosis of SARS-CoV-2 by FDA under an Emergency Use Authorization (EUA). This EUA will remain in effect (meaning this test can be used) for the duration of the COVID-19 declaration under Section 564(b)(1) of the Act, 21 U.S.C. section 360bbb-3(b)(1), unless the authorization is terminated or revoked.     Resp Syncytial Virus by PCR NEGATIVE NEGATIVE Final    Comment: (NOTE) Fact Sheet for Patients: EntrepreneurPulse.com.au  Fact Sheet for Healthcare Providers: IncredibleEmployment.be  This test is not yet approved or cleared by the [place] FDA and has been authorized for detection and/or diagnosis of SARS-CoV-2 by FDA under an Emergency Use Authorization (EUA). This EUA will remain in effect (meaning this test can be used) for the duration of the COVID-19 declaration under Section 564(b)(1) of the Act, 21 U.S.C. section 360bbb-3(b)(1), unless the authorization is terminated or revoked.  Performed at [facility], [place] [address] [place], [place] [address]   Urine Culture     Status: Abnormal   Collection Time: 06/15/22  2:42 AM   Specimen: Urine, Catheterized  Result Value Ref Range Status   Specimen Description URINE, CATHETERIZED  Final   Special Requests   Final    NONE Performed at [facility], [place] [address], [place], [place] [address]    Culture (A)  Final    >=100,000 COLONIES/mL ENTEROBACTER  AEROGENES 80,000 COLONIES/mL ESCHERICHIA COLI    Report Status 06/17/2022 FINAL  Final   Organism ID, Bacteria ENTEROBACTER AEROGENES (A)  Final   Organism ID, Bacteria ESCHERICHIA COLI (A)  Final      Susceptibility   Enterobacter aerogenes - MIC*    CEFAZOLIN RESISTANT Resistant     CEFEPIME <=0.12 SENSITIVE Sensitive     CEFTRIAXONE <=0.25 SENSITIVE Sensitive     CIPROFLOXACIN <=0.25 SENSITIVE Sensitive     GENTAMICIN <=1 SENSITIVE Sensitive     IMIPENEM 1 SENSITIVE Sensitive     NITROFURANTOIN 64 INTERMEDIATE Intermediate     TRIMETH/SULFA <=20 SENSITIVE Sensitive     PIP/TAZO <=4 SENSITIVE  Sensitive     * >=100,000 COLONIES/mL ENTEROBACTER AEROGENES   Escherichia coli - MIC*    AMPICILLIN <=2 SENSITIVE Sensitive     CEFAZOLIN <=4 SENSITIVE Sensitive     CEFEPIME <=0.12 SENSITIVE Sensitive     CEFTRIAXONE <=0.25 SENSITIVE Sensitive     CIPROFLOXACIN <=0.25 SENSITIVE Sensitive     GENTAMICIN <=1 SENSITIVE Sensitive     IMIPENEM <=0.25 SENSITIVE Sensitive     NITROFURANTOIN <=16 SENSITIVE Sensitive     TRIMETH/SULFA <=20 SENSITIVE Sensitive     AMPICILLIN/SULBACTAM <=2 SENSITIVE Sensitive     PIP/TAZO <=4 SENSITIVE Sensitive     * 80,000 COLONIES/mL ESCHERICHIA COLI  MRSA Next Gen by PCR, Nasal     Status: None   Collection Time: 06/15/22  6:00 AM   Specimen: Nasal Mucosa; Nasal Swab  Result Value Ref Range Status   MRSA by PCR Next Gen NOT DETECTED NOT DETECTED Final    Comment: (NOTE) The GeneXpert MRSA Assay (FDA approved for NASAL specimens only), is one component of a comprehensive MRSA colonization surveillance program. It is not intended to diagnose MRSA infection nor to guide or monitor treatment for MRSA infections. Test performance is not FDA approved in patients less than 91 years old. Performed at [facility], [place] [address], [place], [place] [address]      Labs: BNP (last 3 results) Recent Labs    06/15/22 0055  BNP 696.2*   Basic Metabolic  Panel: Recent Labs  Lab 06/18/22 0914 06/23/22 0428 06/24/22 0703  NA 139 137 139  K 3.7 3.0* 3.9  CL 100 96* 101  CO2 '28 29 26  '$ GLUCOSE 198* 167* 145*  BUN 11 24* 27*  CREATININE 0.95 1.21* 1.10*  CALCIUM 8.1* 7.8* 7.7*  MG  --  1.7 2.0   Liver Function Tests: Recent Labs  Lab 06/18/22 0914  AST 21  ALT 14  ALKPHOS 88  BILITOT 0.6  PROT 6.1*  ALBUMIN 2.4*   No results for input(s): "LIPASE", "AMYLASE" in the last 168 hours. No results for input(s): "AMMONIA" in the last 168 hours. CBC: Recent Labs  Lab 06/18/22 0914 06/23/22 0428  WBC 7.1 11.4*  HGB 11.6* 13.1  HCT 37.7 40.2  MCV 94.7 92.0  PLT 193 325   Cardiac Enzymes: No results for input(s): "CKTOTAL", "CKMB", "CKMBINDEX", "TROPONINI" in the last 168 hours. BNP: Invalid input(s): "POCBNP" CBG: Recent Labs  Lab 06/23/22 0746 06/23/22 1130 06/23/22 1623 06/23/22 2012 06/24/22 0709  GLUCAP 158* 170* 211* 151* 154*   D-Dimer No results for input(s): "DDIMER" in the last 72 hours. Hgb A1c No results for input(s): "HGBA1C" in the last 72 hours. Lipid Profile No results for input(s): "CHOL", "HDL", "LDLCALC", "TRIG", "CHOLHDL", "LDLDIRECT" in the last 72 hours. Thyroid function studies No results for input(s): "TSH", "T4TOTAL", "T3FREE", "THYROIDAB" in the last 72 hours.  Invalid input(s): "FREET3" Anemia work up No results for input(s): "VITAMINB12", "FOLATE", "FERRITIN", "TIBC", "IRON", "RETICCTPCT" in the last 72 hours. Urinalysis    Component Value Date/Time   COLORURINE YELLOW 06/15/2022 0130   APPEARANCEUR CLOUDY (A) 06/15/2022 0130   LABSPEC 1.019 06/15/2022 0130   PHURINE 5.0 06/15/2022 0130   GLUCOSEU 150 (A) 06/15/2022 0130   HGBUR LARGE (A) 06/15/2022 0130   HGBUR negative 12/10/2006 1513   BILIRUBINUR NEGATIVE 06/15/2022 0130   KETONESUR 5 (A) 06/15/2022 0130   PROTEINUR >=300 (A) 06/15/2022 0130   UROBILINOGEN 0.2 12/10/2006 1513   NITRITE POSITIVE (A) 06/15/2022 0130  LEUKOCYTESUR LARGE (A) 06/15/2022 0130   Sepsis Labs Recent Labs  Lab 06/18/22 0914 06/23/22 0428  WBC 7.1 11.4*   Microbiology Recent Results (from the past 240 hour(s))  Blood Culture (routine x 2)     Status: None   Collection Time: 06/15/22  1:30 AM   Specimen: BLOOD  Result Value Ref Range Status   Specimen Description BLOOD LEFT ANTECUBITAL  Final   Special Requests   Final    BOTTLES DRAWN AEROBIC AND ANAEROBIC Blood Culture results may not be optimal due to an excessive volume of blood received in culture bottles   Culture   Final    NO GROWTH 5 DAYS Performed at [facility], [place] [address] [place], [place] [address]    Report Status 06/20/2022 FINAL  Final  Blood Culture (routine x 2)     Status: None   Collection Time: 06/15/22  1:30 AM   Specimen: BLOOD  Result Value Ref Range Status   Specimen Description BLOOD RIGHT ANTECUBITAL  Final   Special Requests   Final    BOTTLES DRAWN AEROBIC AND ANAEROBIC Blood Culture results may not be optimal due to an excessive volume of blood received in culture bottles   Culture   Final    NO GROWTH 5 DAYS Performed at [facility], [place] [address] [place], [place] [address]    Report Status 06/20/2022 FINAL  Final  Resp panel by RT-PCR (RSV, Flu A&B, Covid) Anterior Nasal Swab     Status: Abnormal   Collection Time: 06/15/22  1:30 AM   Specimen: Anterior Nasal Swab  Result Value Ref Range Status   SARS Coronavirus 2 by RT PCR NEGATIVE NEGATIVE Final    Comment: (NOTE) SARS-CoV-2 target nucleic acids are NOT DETECTED.  The SARS-CoV-2 RNA is generally detectable in upper respiratory specimens during the acute phase of infection. The lowest concentration of SARS-CoV-2 viral copies this assay can detect is 138 copies/mL. A negative result does not preclude SARS-Cov-2 infection and should not be used as the sole basis for treatment or other patient management decisions. A negative result may occur with  improper  specimen collection/handling, submission of specimen other than nasopharyngeal swab, presence of viral mutation(s) within the areas targeted by this assay, and inadequate number of viral copies(<138 copies/mL). A negative result must be combined with clinical observations, patient history, and epidemiological information. The expected result is Negative.  Fact Sheet for Patients:  EntrepreneurPulse.com.au  Fact Sheet for Healthcare Providers:  IncredibleEmployment.be  This test is no t yet approved or cleared by the [place] FDA and  has been authorized for detection and/or diagnosis of SARS-CoV-2 by FDA under an Emergency Use Authorization (EUA). This EUA will remain  in effect (meaning this test can be used) for the duration of the COVID-19 declaration under Section 564(b)(1) of the Act, 21 U.S.C.section 360bbb-3(b)(1), unless the authorization is terminated  or revoked sooner.       Influenza A by PCR POSITIVE (A) NEGATIVE Final   Influenza B by PCR NEGATIVE NEGATIVE Final    Comment: (NOTE) The Xpert Xpress SARS-CoV-2/FLU/RSV plus assay is intended as an aid in the diagnosis of influenza from Nasopharyngeal swab specimens and should not be used as a sole basis for treatment. Nasal washings and aspirates are unacceptable for Xpert Xpress SARS-CoV-2/FLU/RSV testing.  Fact Sheet for Patients: EntrepreneurPulse.com.au  Fact Sheet for Healthcare Providers: IncredibleEmployment.be  This test is not yet approved or cleared by the [place] FDA  and has been authorized for detection and/or diagnosis of SARS-CoV-2 by FDA under an Emergency Use Authorization (EUA). This EUA will remain in effect (meaning this test can be used) for the duration of the COVID-19 declaration under Section 564(b)(1) of the Act, 21 U.S.C. section 360bbb-3(b)(1), unless the authorization is terminated or revoked.     Resp  Syncytial Virus by PCR NEGATIVE NEGATIVE Final    Comment: (NOTE) Fact Sheet for Patients: EntrepreneurPulse.com.au  Fact Sheet for Healthcare Providers: IncredibleEmployment.be  This test is not yet approved or cleared by the [place] FDA and has been authorized for detection and/or diagnosis of SARS-CoV-2 by FDA under an Emergency Use Authorization (EUA). This EUA will remain in effect (meaning this test can be used) for the duration of the COVID-19 declaration under Section 564(b)(1) of the Act, 21 U.S.C. section 360bbb-3(b)(1), unless the authorization is terminated or revoked.  Performed at [facility], [place] [address], [place], [place] [address]   Urine Culture     Status: Abnormal   Collection Time: 06/15/22  2:42 AM   Specimen: Urine, Catheterized  Result Value Ref Range Status   Specimen Description URINE, CATHETERIZED  Final   Special Requests   Final    NONE Performed at [facility], [place] [address], [place], [place] [address]    Culture (A)  Final    >=100,000 COLONIES/mL ENTEROBACTER AEROGENES 80,000 COLONIES/mL ESCHERICHIA COLI    Report Status 06/17/2022 FINAL  Final   Organism ID, Bacteria ENTEROBACTER AEROGENES (A)  Final   Organism ID, Bacteria ESCHERICHIA COLI (A)  Final      Susceptibility   Enterobacter aerogenes - MIC*    CEFAZOLIN RESISTANT Resistant     CEFEPIME <=0.12 SENSITIVE Sensitive     CEFTRIAXONE <=0.25 SENSITIVE Sensitive     CIPROFLOXACIN <=0.25 SENSITIVE Sensitive     GENTAMICIN <=1 SENSITIVE Sensitive     IMIPENEM 1 SENSITIVE Sensitive     NITROFURANTOIN 64 INTERMEDIATE Intermediate     TRIMETH/SULFA <=20 SENSITIVE Sensitive     PIP/TAZO <=4 SENSITIVE Sensitive     * >=100,000 COLONIES/mL ENTEROBACTER AEROGENES   Escherichia coli - MIC*    AMPICILLIN <=2 SENSITIVE Sensitive     CEFAZOLIN <=4 SENSITIVE Sensitive     CEFEPIME <=0.12 SENSITIVE Sensitive     CEFTRIAXONE <=0.25  SENSITIVE Sensitive     CIPROFLOXACIN <=0.25 SENSITIVE Sensitive     GENTAMICIN <=1 SENSITIVE Sensitive     IMIPENEM <=0.25 SENSITIVE Sensitive     NITROFURANTOIN <=16 SENSITIVE Sensitive     TRIMETH/SULFA <=20 SENSITIVE Sensitive     AMPICILLIN/SULBACTAM <=2 SENSITIVE Sensitive     PIP/TAZO <=4 SENSITIVE Sensitive     * 80,000 COLONIES/mL ESCHERICHIA COLI  MRSA Next Gen by PCR, Nasal     Status: None   Collection Time: 06/15/22  6:00 AM   Specimen: Nasal Mucosa; Nasal Swab  Result Value Ref Range Status   MRSA by PCR Next Gen NOT DETECTED NOT DETECTED Final    Comment: (NOTE) The GeneXpert MRSA Assay (FDA approved for NASAL specimens only), is one component of a comprehensive MRSA colonization surveillance program. It is not intended to diagnose MRSA infection nor to guide or monitor treatment for MRSA infections. Test performance is not FDA approved in patients less than 56 years old. Performed at [facility], [place] [address] [place], [place] [address]      Time coordinating discharge: Over 30 minutes  SIGNED:   Madisin Hasan J [place]  DO  [facility] 06/24/2022, 10:06 AM

## 2022-06-25 DIAGNOSIS — A419 Sepsis, unspecified organism: Secondary | ICD-10-CM | POA: Diagnosis not present

## 2022-06-25 DIAGNOSIS — N390 Urinary tract infection, site not specified: Secondary | ICD-10-CM | POA: Diagnosis not present

## 2022-06-25 LAB — GLUCOSE, CAPILLARY
Glucose-Capillary: 157 mg/dL — ABNORMAL HIGH (ref 70–99)
Glucose-Capillary: 213 mg/dL — ABNORMAL HIGH (ref 70–99)
Glucose-Capillary: 120 mg/dL — ABNORMAL HIGH (ref 70–99)
Glucose-Capillary: 156 mg/dL — ABNORMAL HIGH (ref 70–99)

## 2022-06-25 NOTE — TOC Progression Note (Signed)
Transition of Care [facility]) - Initial/Assessment Note    Patient Details  Name: Jody Simpson MRN: [id number] Date of Birth: 10/08/1950  [facility] CM/SW Contact:    Milinda Antis, LCSWA Phone Number: 06/25/2022, 10:24 AM  Clinical Narrative:                 LCSW met with patient at bedside.  Patient expressed that EMS would not take her home yesterday and now she is agreeable to SNF.  The patient agreed to go to [facility]. LCSW informed patient of the current COVID status at the SNF and patient is in agreement with going.   LCSW contacted admissions at [facility]).  The facility verified that they can accept the patient and will request insurance authorization today.  TOC following.  Expected Discharge Plan: [place] Barriers to Discharge: Barriers Resolved   Patient Goals and CMS Choice Patient states their goals for this hospitalization and ongoing recovery are:: To return to [facility] CMS Medicare.gov Compare Post Acute Care list provided to:: Patient Choice offered to / list presented to : Patient      Expected Discharge Plan and Services   Discharge Planning Services: CM Consult Post Acute Care Choice: [place] arrangements for the past 2 months: [place] Expected Discharge Date: 06/24/22               DME Arranged: N/A DME Agency: NA       HH Arranged: PT, OT HH Agency: [place] Date [place]: 06/16/22 Time HH Agency Contacted: 63 Representative spoke with at [place]: Tommi Rumps  Prior Living Arrangements/Services Living arrangements for the past 2 months: Materials engineer Lives with:: Self Patient language and need for interpreter reviewed:: Yes Do you feel safe going back to the place where you live?: Yes      Need for Family Participation in Patient Care: Yes (Comment) Care giver support system in place?: Yes (comment) Current home services: DME ([employer],  w/c handicapped bathroom) Criminal Activity/Legal Involvement Pertinent to Current Situation/Hospitalization: No - Comment as needed  Activities of Daily Living Home Assistive Devices/Equipment: Environmental consultant (specify type) ADL Screening (condition at time of admission) Patient's cognitive ability adequate to safely complete daily activities?: Yes Is the patient deaf or have difficulty hearing?: No Does the patient have difficulty seeing, even when wearing glasses/contacts?: No Does the patient have difficulty concentrating, remembering, or making decisions?: No Patient able to express need for assistance with ADLs?: Yes Does the patient have difficulty dressing or bathing?: No Independently performs ADLs?: Yes (appropriate for developmental age) Does the patient have difficulty walking or climbing stairs?: Yes Weakness of Legs: Both Weakness of Arms/Hands: None  Permission Sought/Granted Permission sought to share information with : Case Manager, Customer service manager, Family Supports Permission granted to share information with : Yes, Verbal Permission Granted              Emotional Assessment Appearance:: Appears stated age Attitude/Demeanor/Rapport: Engaged, Gracious Affect (typically observed): Accepting, Hopeful, Appropriate, Pleasant   Alcohol / Substance Use: Not Applicable Psych Involvement: No (comment)  Admission diagnosis:  Hypoxia [R09.02] Acute UTI [N39.0] AKI (acute kidney injury) ([place]) [N17.9] Sepsis secondary to UTI ([place]) [A41.9, N39.0] Influenza [J11.1] Sepsis, due to unspecified organism, unspecified whether acute organ dysfunction present [facility]) [A41.9] Patient Active Problem List   Diagnosis Date Noted   Sepsis secondary to UTI ([place]) 06/15/2022   CAP (community acquired pneumonia) 07/03/2016  Obesity-BMI 51 07/03/2016   Fall-down at home x 48 hrs 07/03/2016   PAT (paroxysmal atrial tachycardia) 07/03/2016   COLONIC POLYPS 09/08/2007   Gout  09/08/2007   GASTROESOPHAGEAL REFLUX DISEASE 09/08/2007   IRRITABLE BOWEL SYNDROME 09/08/2007   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[place]) 07/28/2006   Hyperlipemia 07/28/2006   SOLITARY KIDNEY 07/28/2006   Diabetes mellitus, type 2 ([place]) 07/21/2006   Essential hypertension 07/21/2006   DERMATITIS, ATOPIC 07/21/2006   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 [place] [address] Phone: [phone] Fax: [phone]     Social Determinants of Health ([place]) Social History: [place]: No Food Insecurity (06/15/2022)  Housing: Low Risk  (06/15/2022)  Transportation Needs: No Transportation Needs (06/15/2022)  Utilities: Not At Risk (06/15/2022)  Tobacco Use: Low Risk  (06/15/2022)   SDOH Interventions: Transportation Interventions: Intervention Not Indicated, Inpatient TOC, [facility])   Readmission Risk Interventions     No data to display

## 2022-06-25 NOTE — Progress Notes (Signed)
PROGRESS NOTE    Jody Simpson  YTK:[id number] DOB: 01-09-1951 DOA: 06/15/2022 PCP: Jinny Blossom, [facility]   Brief Narrative: 72 year old with past medical history significant for hypertension, hyperlipidemia, SVT, diabetes type 2, GERD who presented to [facility] ED on 1/14 from independent living facility via EMS due to generalized weakness.  Patient endorsed feeling so weak that she slid out of her chair and ended up on the floor.  Patient also reports she was diagnosed with influenza on Friday prior to admission.  At the facility they cannot get her up per their policy.  Reportedly.  I have the staff patient was unresponsive on the floor and EMS was called.  On EMS arrival, vitals signs notable for soft blood pressure, bradycardia with heart rate in the 50s.  In the ED concern for sepsis with leukocytosis, urine culture and blood cultures obtained.  She was started on antibiotics.  She was also started on Tamiflu.  She was admitted for further management of sepsis, Enterococcus E. coli UTI.  She was supposed to be discharged home on 8/23 to independent living.  Patient declined initially skilled nursing facility.  Discharge was canceled patient was not able to stand up for discharge   Assessment & Plan:   Principal Problem:   Sepsis secondary to UTI ([place])   1-Sepsis, POA Enterococcus/E. coli UTI Send with fever 101, leukocytosis 12.5, UA consistent with UTI.  Urine culture grew out Enterococcus and E. Coli Patient has completed 5 days of IV ceftriaxone  2-Deconditioning, debility generalized weakness: PT OT recommending skilled nursing facility.  Patient now agreed for rehab  3-Influenza A viral infection: Completed 5 days of Tamiflu  2-1 Block,  High-grade AV conduction disease transient, first-degree block: Was evaluated by cardiology, atenolol was discontinued. Patient will need to  follow-up with  EP. Need Zio patch at discharge.  Follow-up with Dr. Davina Poke in 4  to 6 weeks.  Avoid AV nodal blocking agents.  Acute on chronic diastolic heart failure exacerbation: Hypertension: Patient was treated by IV Lasix.  Cardiology was consulted and assisting with management. Echo ejection fraction 60 to 65%. Continue with spironolactone, lisinopril, hydralazine.  Furosemide 20 daily.  Elevated troponin: In the setting of acute diastolic heart failure exacerbation. Patient follow-up with cardiology  AKI: Baseline creatinine 1.0, present with a creatinine of 1.3.  Secondary to prerenal azotemia in the setting of dehydration she received IV fluids.  Diabetes type 2 with hyperglycemia: Poorly controlled A1c 8.2  Questionable syncope: Per ILF staff patient was not responsive on the floor.  Echo was normal.  Follow-up as an outpatient with cardiology  Elevated CPK 1000  on arrival.  Received IV fluids  Isolated hyperbilirubinemia: 1.5 on admission.  Now resolved  Hypomagnesemia, Hypokalemia: Replaced.   Morbid obesity: Needs lifestyle modification    Pressure Injury 06/15/22 Buttocks Right Stage 2 -  Partial thickness loss of dermis presenting as a shallow open injury with a red, pink wound bed without slough. (Active)  06/15/22 1427  Location: Buttocks  Location Orientation: Right  Staging: Stage 2 -  Partial thickness loss of dermis presenting as a shallow open injury with a red, pink wound bed without slough.  Wound Description (Comments):   Present on Admission: Yes  Dressing Type Foam - Lift dressing to assess site every shift 06/25/22 0848      Estimated body mass index is 51.45 kg/m as calculated from the following:   Height as of this encounter: '5\' 6"'$  (1.676 m).  Weight as of this encounter: 144.6 kg.   DVT prophylaxis: Lovenox Code Status: Full code Family Communication: care discussed with patient Disposition Plan:  Status is: Inpatient Remains inpatient appropriate because: awaiting SNF    Consultants:  Cardiology    Procedures:  ECHO  Antimicrobials:    Subjective: She is feeling ok. She now agrees to go to rehab.   Objective: Vitals:   06/24/22 1611 06/24/22 2119 06/25/22 0817 06/25/22 1649  BP: (!) 149/54 (!) 167/78 (!) 153/75 (!) 144/58  Pulse: (!) 103 94 (!) 103 (!) 103  Resp: '16 18 17 17  '$ Temp: 98.8 F (37.1 C) 98 F (36.7 C) 98 F (36.7 C) 97.6 F (36.4 C)  TempSrc: Oral Oral Oral Oral  SpO2: 93% 92% 91% 93%  Weight:      Height:        Intake/Output Summary (Last 24 hours) at 06/25/2022 1712 Last data filed at 06/25/2022 1300 Gross per 24 hour  Intake 680 ml  Output 300 ml  Net 380 ml   Filed Weights   06/15/22 0047 06/21/22 2027 06/22/22 2051  Weight: 136.1 kg (!) 144.6 kg (!) 144.6 kg    Examination:  General exam: Appears calm and comfortable  Respiratory system: Clear to auscultation. Respiratory effort normal. Cardiovascular system: S1 & S2 heard, RRR.  Gastrointestinal system: Abdomen is nondistended, soft and nontender. No organomegaly or masses felt. Normal bowel sounds heard. Central nervous system: Alert and oriented.  Extremities: Symmetric 5 x 5 power.   Data Reviewed: I have personally reviewed following labs and imaging studies  CBC: Recent Labs  Lab 06/23/22 0428  WBC 11.4*  HGB 13.1  HCT 40.2  MCV 92.0  PLT 892   Basic Metabolic Panel: Recent Labs  Lab 06/23/22 0428 06/24/22 0703  NA 137 139  K 3.0* 3.9  CL 96* 101  CO2 29 26  GLUCOSE 167* 145*  BUN 24* 27*  CREATININE 1.21* 1.10*  CALCIUM 7.8* 7.7*  MG 1.7 2.0   GFR: Estimated Creatinine Clearance: 70.2 mL/min (A) (by C-G formula based on SCr of 1.1 mg/dL (H)). Liver Function Tests: No results for input(s): "AST", "ALT", "ALKPHOS", "BILITOT", "PROT", "ALBUMIN" in the last 168 hours. No results for input(s): "LIPASE", "AMYLASE" in the last 168 hours. No results for input(s): "AMMONIA" in the last 168 hours. Coagulation Profile: No results for input(s): "INR", "PROTIME" in  the last 168 hours. Cardiac Enzymes: No results for input(s): "CKTOTAL", "CKMB", "CKMBINDEX", "TROPONINI" in the last 168 hours. BNP (last 3 results) No results for input(s): "PROBNP" in the last 8760 hours. HbA1C: No results for input(s): "HGBA1C" in the last 72 hours. CBG: Recent Labs  Lab 06/24/22 1825 06/24/22 2118 06/25/22 0719 06/25/22 1113 06/25/22 1618  GLUCAP 112* 131* 157* 213* 120*   Lipid Profile: No results for input(s): "CHOL", "HDL", "LDLCALC", "TRIG", "CHOLHDL", "LDLDIRECT" in the last 72 hours. Thyroid Function Tests: No results for input(s): "TSH", "T4TOTAL", "FREET4", "T3FREE", "THYROIDAB" in the last 72 hours. Anemia Panel: No results for input(s): "VITAMINB12", "FOLATE", "FERRITIN", "TIBC", "IRON", "RETICCTPCT" in the last 72 hours. Sepsis Labs: No results for input(s): "PROCALCITON", "LATICACIDVEN" in the last 168 hours.  No results found for this or any previous visit (from the past 240 hour(s)).       Radiology Studies: No results found.      Scheduled Meds:  allopurinol  100 mg Oral Daily   enoxaparin (LOVENOX) injection  40 mg Subcutaneous Q24H   famotidine  20 mg Oral BID  fluticasone  2 spray Each Nare Daily   guaiFENesin  600 mg Oral BID   hydrALAZINE  100 mg Oral Q8H   hydrocerin   Topical Weekly   insulin aspart  0-5 Units Subcutaneous QHS   insulin aspart  0-9 Units Subcutaneous TID WC   insulin aspart  6 Units Subcutaneous TID WC   insulin glargine-yfgn  25 Units Subcutaneous Daily   lisinopril  40 mg Oral Daily   montelukast  10 mg Oral QHS   spironolactone  25 mg Oral Daily   Continuous Infusions:   LOS: 10 days    Time spent: 35 minutes    Lenita Peregrina A Singleton Hickox, MD [facility]   If 7PM-7AM, please contact night-coverage [email or web address]  06/25/2022, 5:12 PM

## 2022-06-25 NOTE — Care Management Important Message (Signed)
Important Message  Patient Details  Name: Jody Simpson MRN: [id number] Date of Birth: Jan 26, 1951   Medicare Important Message Given:  Yes     Shelda Altes 06/25/2022, 1:35 PM

## 2022-06-26 DIAGNOSIS — A419 Sepsis, unspecified organism: Secondary | ICD-10-CM | POA: Diagnosis not present

## 2022-06-26 DIAGNOSIS — N390 Urinary tract infection, site not specified: Secondary | ICD-10-CM | POA: Diagnosis not present

## 2022-06-26 LAB — GLUCOSE, CAPILLARY
Glucose-Capillary: 147 mg/dL — ABNORMAL HIGH (ref 70–99)
Glucose-Capillary: 140 mg/dL — ABNORMAL HIGH (ref 70–99)
Glucose-Capillary: 82 mg/dL (ref 70–99)

## 2022-06-26 MED ORDER — INSULIN GLARGINE-YFGN 100 UNIT/ML ~~LOC~~ SOLN: 25.0000 [IU] | Freq: Every day | SUBCUTANEOUS | 11 refills | Status: DC

## 2022-06-26 MED ORDER — INSULIN ASPART 100 UNIT/ML IJ SOLN: 6.0000 [IU] | Freq: Three times a day (TID) | INTRAMUSCULAR | 11 refills | Status: AC

## 2022-06-26 MED ORDER — ENOXAPARIN SODIUM 80 MG/0.8ML IJ SOSY: 0.5000 mg/kg | PREFILLED_SYRINGE | INTRAMUSCULAR | Status: DC

## 2022-06-26 NOTE — Discharge Summary (Addendum)
Physician Discharge Summary   Patient: Jody Simpson MRN: [id number] DOB: 04/25/1951  Admit date:     06/15/2022  Discharge date: 06/26/22  Discharge Physician: Elmarie Shiley   PCP: Jinny Blossom, [facility]   Recommendations at discharge:   Follow up with PCP in 1-2 weeks Follow-up with cardiology, Dr. Audie Box in 1-2 weeks Discontinue atenolol, need to avoid all AV nodal blocking agents for tachybradycardia syndrome Started on lisinopril 40 mg p.o. daily, hydralazine 100 mg p.o. 3 times daily, spironolactone 25 mg p.o. daily, and furosemide 20 mg p.o. daily as needed for lower extremity edema Please obtain BMP in one week to reassess potassium and renal function  Discharge Diagnoses: Principal Problem:   Sepsis secondary to UTI [facility])  Resolved Problems:   * No resolved hospital problems. [facility] Hospital Course: 72 year old with past medical history significant for hypertension, hyperlipidemia, SVT, diabetes type 2, GERD who [facility] ED on 1/14 from independent living facility via EMS due to generalized weakness.  Patient endorsed feeling so weak that she slid out of her chair and ended up on the floor.  Patient also reports she was diagnosed with influenza on Friday prior to admission.  At the facility they cannot get her up per their policy.  Reportedly.  I have the staff patient was unresponsive on the floor and EMS was called.  On EMS arrival, vitals signs notable for soft blood pressure, bradycardia with heart rate in the 50s.   In the ED concern for sepsis with leukocytosis, urine culture and blood cultures obtained.  She was started on antibiotics.  She was also started on Tamiflu.  She was admitted for further management of sepsis, Enterococcus E. coli UTI.  She was supposed to be discharged home on 8/23 to independent living.  Patient declined initially skilled nursing facility.  Discharge was canceled patient was not able to stand up for discharge    Assessment and Plan: No notes have been filed under this hospital service. Service: Hospitalist 1-Sepsis, [place] Enterococcus/E. coli UTI Send with fever 101, leukocytosis 12.5, UA consistent with UTI.  Urine culture grew out Enterococcus and E. Coli Patient has completed 5 days of IV ceftriaxone  Severe sepsis ruled out.   2-Deconditioning, debility generalized weakness: PT OT recommending skilled nursing facility.  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 Patient will need to  follow-up with  EP. Need Zio patch at discharge.  Follow-up with Dr. Davina Poke in 4 to 6 weeks.  Avoid AV nodal blocking agents.   Acute on chronic diastolic heart failure exacerbation: Hypertension: Patient was treated by IV Lasix.  Cardiology was consulted and assisting with management. Echo ejection fraction 60 to 65%. Continue with spironolactone, lisinopril, hydralazine.  Furosemide 20 daily.   Elevated troponin: In the setting of acute diastolic heart failure exacerbation. Patient follow-up with cardiology   AKI: Baseline creatinine 1.0, present with a creatinine of 1.3.  Secondary to prerenal azotemia in the setting of dehydration she received IV fluids.   Diabetes type 2 with hyperglycemia: Poorly controlled A1c 8.2   Questionable syncope: Per ILF staff patient was not responsive on the floor.  Echo was normal.  Follow-up as an outpatient with cardiology   Elevated CPK 1000  on arrival.  Received IV fluids   Isolated hyperbilirubinemia: 1.5 on admission.  Now resolved   Hypomagnesemia, Hypokalemia: Replaced.    Morbid obesity: Needs lifestyle modification           Consultants: Cardiology Procedures performed: ECHO Disposition: Skilled nursing facility Diet recommendation:  Discharge Diet Orders (From  admission, onward)     Start     Ordered   06/26/22 0000  Diet - low sodium heart healthy        06/26/22 1309   06/24/22 0000  Diet - low sodium heart healthy        06/24/22 1006           Cardiac diet DISCHARGE MEDICATION: Allergies as of 06/26/2022       Reactions   Aspirin    REACTION: stomach irritation   Banana    Cholestyramine    REACTION: Diarrhea and nausea   Codeine    REACTION: stomach irritant   Diazepam    REACTION: hyper   Hydrocodone    Ibuprofen Other (See Comments)   Iodine    Latex    Meperidine Hcl    Metformin    REACTION: Diarrhea   Naproxen Sodium    REACTION: stomach irritant   Penicillins    Pravastatin Sodium    REACTION: Flu like symptoms   Sitagliptin Phosphate    REACTION: Caused elevated CBGs   Tape Rash   Tegaderm transparent plastic film        Medication List     STOP taking these medications    atenolol 50 MG tablet Commonly known as: TENORMIN   Insulin Aspart FlexPen 100 UNIT/ML Commonly known as: NOVOLOG Replaced by: insulin aspart 100 UNIT/ML injection   lisinopril-hydrochlorothiazide 20-25 MG tablet Commonly known as: ZESTORETIC   Soliqua 100-33 UNT-MCG/ML Sopn Generic drug: Insulin Glargine-Lixisenatide       TAKE these medications    acetaminophen 325 MG tablet Commonly known as: TYLENOL Take 650 mg by mouth every 4 (four) hours as needed for mild pain.   allopurinol 300 MG tablet Commonly known as: ZYLOPRIM Take 300 mg by mouth daily.   baclofen 10 MG tablet Commonly known as: LIORESAL Take 10 mg by mouth daily as needed for muscle spasms.   benzonatate 100 MG capsule Commonly known as: TESSALON Take 100 mg by mouth 3 (three) times daily as needed for cough.   famotidine 20 MG tablet Commonly known as: PEPCID Take 20 mg by mouth 2 (two) times daily.   fluticasone 50 MCG/ACT nasal spray Commonly known as: FLONASE Place 2 sprays into both nostrils daily as needed for allergies.    furosemide 20 MG tablet Commonly known as: Lasix Take 1 tablet (20 mg total) by mouth daily as needed for edema.   glipiZIDE 10 MG tablet Commonly known as: GLUCOTROL Take 10 mg by mouth 2 (two) times daily.   hydrALAZINE 100 MG tablet Commonly known as: APRESOLINE Take 1 tablet (100 mg total) by mouth every 8 (eight) hours.   insulin aspart 100 UNIT/ML injection Commonly known as: novoLOG Inject 6 Units into the skin 3 (three) times daily with meals. Replaces: Insulin Aspart FlexPen 100 UNIT/ML   insulin glargine-yfgn 100 UNIT/ML injection Commonly known as: SEMGLEE Inject 0.25 mLs (25 Units total) into the skin daily. Start taking on: June 27, 2022   lisinopril 40 MG tablet Commonly known as: ZESTRIL Take 1 tablet (40 mg total) by mouth daily.   loratadine 10 MG tablet Commonly known as: CLARITIN Take 10 mg by mouth daily.   montelukast 10 MG tablet Commonly  known as: SINGULAIR Take 10 mg by mouth at bedtime.   spironolactone 25 MG tablet Commonly known as: ALDACTONE Take 1 tablet (25 mg total) by mouth daily.   Ventolin HFA 108 (90 Base) MCG/ACT inhaler Generic drug: albuterol Inhale 2 puffs into the lungs every 4 (four) hours as needed for wheezing or shortness of breath.               Discharge Care Instructions  (From admission, onward)           Start     Ordered   06/26/22 0000  Discharge wound care:       Comments: See above   06/26/22 1309   06/24/22 0000  Discharge wound care:       Comments: Continue Unna boots changed weekly   06/24/22 1006            Contact information for follow-up providers     Equiptment, [facility]. Schedule an appointment as soon as possible for a visit in 1 week(s).   Contact information: [address] [place] [address] [phone]         Geralynn Rile, MD. Schedule an appointment as soon as possible for a visit.   Specialties: Cardiology, Internal Medicine,  Radiology Contact information: [place] [address] [place], [facility] Follow up.   Specialty: Home Health Services Why: Someone will call you to schedule first home visit. If you have not received a call after two days of discharging home, call their number listed. If no one comes to assess, call Case Manager at [phone] Contact information: [place] [address] [phone]              Contact information for after-discharge care     Destination     [facility] .   Service: Skilled Nursing Contact information: [address] Test Update Address [place] [address] [phone]                    Discharge Exam: Danley Danker Weights   06/15/22 0047 06/21/22 2027 06/22/22 2051  Weight: 136.1 kg (!) 144.6 kg (!) 144.6 kg   General; NAD  Condition at discharge: stable  The results of significant diagnostics from this hospitalization (including imaging, microbiology, ancillary and laboratory) are listed below for reference.   Imaging Studies: ECHOCARDIOGRAM COMPLETE  Result Date: 06/16/2022    ECHOCARDIOGRAM REPORT   Patient Name:   Jody Simpson Lippe Date of Exam: 06/16/2022 Medical Rec #:  [id number]       Height:       67.0 in Accession #:    [id number]      Weight:       300.0 lb Date of Birth:  May 20, 1951       BSA:          2.403 m Patient Age:    72 years        BP:           151/68 mmHg Patient Gender: F               HR:           55 bpm. Exam Location:  Inpatient Procedure: 2D Echo, Color Doppler and Cardiac Doppler Indications:    elevated troponin  History:        Patient has no  prior history of Echocardiogram examinations.                 Risk Factors:Diabetes, Hypertension and Dyslipidemia.  Sonographer:    Phineas Douglas Referring Phys: [id number] [place]  1. Left ventricular ejection fraction, by estimation, is 60 to 65%.  The left ventricle has normal function. The left ventricle has no regional wall motion abnormalities. There is mild left ventricular hypertrophy. Left ventricular diastolic parameters are indeterminate.  2. Right ventricular systolic function is normal. The right ventricular size is normal. There is moderately elevated pulmonary artery systolic pressure.  3. Left atrial size was moderately dilated.  4. The mitral valve is degenerative. Mild mitral valve regurgitation. No evidence of mitral stenosis. Severe mitral annular calcification.  5. The aortic valve was not well visualized. There is moderate calcification of the aortic valve. There is moderate thickening of the aortic valve. Aortic valve regurgitation is not visualized. Mild aortic valve stenosis.  6. The inferior vena cava is normal in size with greater than 50% respiratory variability, suggesting right atrial pressure of 3 mmHg. FINDINGS  Left Ventricle: Left ventricular ejection fraction, by estimation, is 60 to 65%. The left ventricle has normal function. The left ventricle has no regional wall motion abnormalities. The left ventricular internal cavity size was normal in size. There is  mild left ventricular hypertrophy. Left ventricular diastolic parameters are indeterminate. Right Ventricle: The right ventricular size is normal. No increase in right ventricular wall thickness. Right ventricular systolic function is normal. There is moderately elevated pulmonary artery systolic pressure. The tricuspid regurgitant velocity is 3.12 m/s, and with an assumed right atrial pressure of 15 mmHg, the estimated right ventricular systolic pressure is 25.4 mmHg. Left Atrium: Left atrial size was moderately dilated. Right Atrium: Right atrial size was normal in size. Pericardium: There is no evidence of pericardial effusion. Mitral Valve: The mitral valve is degenerative in appearance. There is severe thickening of the mitral valve leaflet(s). There is severe  calcification of the mitral valve leaflet(s). Severe mitral annular calcification. Mild mitral valve regurgitation. No evidence of mitral valve stenosis. Tricuspid Valve: The tricuspid valve is normal in structure. Tricuspid valve regurgitation is mild . No evidence of tricuspid stenosis. Aortic Valve: The aortic valve was not well visualized. There is moderate calcification of the aortic valve. There is moderate thickening of the aortic valve. Aortic valve regurgitation is not visualized. Mild aortic stenosis is present. Aortic valve mean gradient measures 12.5 mmHg. Aortic valve peak gradient measures 22.0 mmHg. Aortic valve area, by VTI measures 1.66 cm. Pulmonic Valve: The pulmonic valve was normal in structure. Pulmonic valve regurgitation is trivial. No evidence of pulmonic stenosis. Aorta: The aortic root is normal in size and structure. Venous: The inferior vena cava is normal in size with greater than 50% respiratory variability, suggesting right atrial pressure of 3 mmHg. IAS/Shunts: No atrial level shunt detected by color flow Doppler.  LEFT VENTRICLE PLAX 2D LVIDd:         5.20 cm      Diastology LVIDs:         3.20 cm      LV e' medial:    8.49 cm/s LV PW:         1.30 cm      LV E/e' medial:  25.0 LV IVS:        1.20 cm      LV e' lateral:   11.00 cm/s LVOT diam:     1.90 cm  LV E/e' lateral: 19.3 LV SV:         75 LV SV Index:   31 LVOT Area:     2.84 cm  LV Volumes (MOD) LV vol d, MOD A2C: 125.0 ml LV vol d, MOD A4C: 137.0 ml LV vol s, MOD A2C: 49.2 ml LV vol s, MOD A4C: 45.9 ml LV SV MOD A2C:     75.8 ml LV SV MOD A4C:     137.0 ml LV SV MOD BP:      85.5 ml RIGHT VENTRICLE          IVC RV Basal diam:  4.40 cm  IVC diam: 2.90 cm TAPSE (M-mode): 2.0 cm LEFT ATRIUM             Index        RIGHT ATRIUM           Index LA diam:        4.70 cm 1.96 cm/m   RA Area:     18.30 cm LA Vol (A2C):   70.4 ml 29.30 ml/m  RA Volume:   46.70 ml  19.44 ml/m LA Vol (A4C):   76.3 ml 31.76 ml/m LA Biplane  Vol: 73.5 ml 30.59 ml/m  AORTIC VALVE                     PULMONIC VALVE AV Area (Vmax):    1.69 cm      PR End Diast Vel: 6.25 msec AV Area (Vmean):   1.79 cm AV Area (VTI):     1.66 cm AV Vmax:           234.75 cm/s AV Vmean:          163.500 cm/s AV VTI:            0.452 m AV Peak Grad:      22.0 mmHg AV Mean Grad:      12.5 mmHg LVOT Vmax:         140.00 cm/s LVOT Vmean:        103.000 cm/s LVOT VTI:          0.265 m LVOT/AV VTI ratio: 0.59  AORTA Ao Root diam: 3.00 cm Ao Asc diam:  3.20 cm MITRAL VALVE                TRICUSPID VALVE MV Area (PHT): 3.79 cm     TR Peak grad:   38.9 mmHg MV Decel Time: 200 msec     TR Vmax:        312.00 cm/s MR Peak grad: 118.8 mmHg MR Mean grad: 80.0 mmHg     SHUNTS MR Vmax:      545.00 cm/s   Systemic VTI:  0.26 m MR Vmean:     431.0 cm/s    Systemic Diam: 1.90 cm MV E velocity: 212.00 cm/s Jenkins Rouge MD Electronically signed by Jenkins Rouge MD Signature Date/Time: 06/16/2022/8:47:50 AM    Final    CT Head Wo Contrast  Result Date: 06/15/2022 CLINICAL DATA:  Head trauma, minor (Age >= 65y) EXAM: CT HEAD WITHOUT CONTRAST TECHNIQUE: Contiguous axial images were obtained from the base of the skull through the vertex without intravenous contrast. RADIATION DOSE REDUCTION: This exam was performed according to the departmental dose-optimization program which includes automated exposure control, adjustment of the mA and/or kV according to patient size and/or use of iterative reconstruction technique. COMPARISON:  None Available. FINDINGS: Brain: No evidence of large-territorial acute infarction.  No parenchymal hemorrhage. No mass lesion. No extra-axial collection. No mass effect or midline shift. No hydrocephalus. Basilar cisterns are patent. Vascular: No hyperdense vessel. Skull: No acute fracture or focal lesion. Sinuses/Orbits: Paranasal sinuses and mastoid air cells are clear. Left lens replacement. Otherwise the orbits are unremarkable. Other: None. IMPRESSION: No acute  intracranial abnormality. Electronically Signed   By: Iven Finn M.D.   On: 06/15/2022 02:28   DG Chest Port 1 View  Result Date: 06/15/2022 CLINICAL DATA:  Questionable sepsis. EXAM: PORTABLE CHEST 1 VIEW COMPARISON:  AP Lat 07/04/2016. FINDINGS: The heart is moderately enlarged. There is mild central vascular prominence without overt edema. The lungs are clear of infiltrates. There mild chronic interstitial changes. The mediastinum is normally outlined. There is calcification in the aortic arch. Degenerative changes and slight dextroscoliosis thoracic spine. IMPRESSION: 1. Cardiomegaly with mild central vascular prominence but no overt edema. 2. No other evidence of acute chest disease.  Chronic change. Electronically Signed   By: Telford Nab M.D.   On: 06/15/2022 01:38   DG Pelvis Portable  Result Date: 06/15/2022 CLINICAL DATA:  Fall EXAM: PORTABLE PELVIS 1-2 VIEWS COMPARISON:  04/09/2010 FINDINGS: Symmetric degenerative changes in the hips. No acute bony abnormality. Specifically, no fracture, subluxation, or dislocation. IMPRESSION: No acute bony abnormality. Electronically Signed   By: Rolm Baptise M.D.   On: 06/15/2022 01:35    Microbiology: Results for orders placed or performed during the hospital encounter of 06/15/22  Blood Culture (routine x 2)     Status: None   Collection Time: 06/15/22  1:30 AM   Specimen: BLOOD  Result Value Ref Range Status   Specimen Description BLOOD LEFT ANTECUBITAL  Final   Special Requests   Final    BOTTLES DRAWN AEROBIC AND ANAEROBIC Blood Culture results may not be optimal due to an excessive volume of blood received in culture bottles   Culture   Final    NO GROWTH 5 DAYS Performed at [facility], [place] [address], [place], [place] [address]    Report Status 06/20/2022 FINAL  Final  Blood Culture (routine x 2)     Status: None   Collection Time: 06/15/22  1:30 AM   Specimen: BLOOD  Result Value Ref Range Status   Specimen  Description BLOOD RIGHT ANTECUBITAL  Final   Special Requests   Final    BOTTLES DRAWN AEROBIC AND ANAEROBIC Blood Culture results may not be optimal due to an excessive volume of blood received in culture bottles   Culture   Final    NO GROWTH 5 DAYS Performed at [facility], [place] [address] [place], [place] [address]    Report Status 06/20/2022 FINAL  Final  Resp panel by RT-PCR (RSV, Flu A&B, Covid) Anterior Nasal Swab     Status: Abnormal   Collection Time: 06/15/22  1:30 AM   Specimen: Anterior Nasal Swab  Result Value Ref Range Status   SARS Coronavirus 2 by RT PCR NEGATIVE NEGATIVE Final    Comment: (NOTE) SARS-CoV-2 target nucleic acids are NOT DETECTED.  The SARS-CoV-2 RNA is generally detectable in upper respiratory specimens during the acute phase of infection. The lowest concentration of SARS-CoV-2 viral copies this assay can detect is 138 copies/mL. A negative result does not preclude SARS-Cov-2 infection and should not be used as the sole basis for treatment or other patient management decisions. A negative result may occur with  improper specimen collection/handling, submission of specimen other than nasopharyngeal swab,  presence of viral mutation(s) within the areas targeted by this assay, and inadequate number of viral copies(<138 copies/mL). A negative result must be combined with clinical observations, patient history, and epidemiological information. The expected result is Negative.  Fact Sheet for Patients:  EntrepreneurPulse.com.au  Fact Sheet for Healthcare Providers:  IncredibleEmployment.be  This test is no t yet approved or cleared by the [place] FDA and  has been authorized for detection and/or diagnosis of SARS-CoV-2 by FDA under an Emergency Use Authorization (EUA). This EUA will remain  in effect (meaning this test can be used) for the duration of the COVID-19 declaration under Section 564(b)(1) of  the Act, 21 U.S.C.section 360bbb-3(b)(1), unless the authorization is terminated  or revoked sooner.       Influenza A by PCR POSITIVE (A) NEGATIVE Final   Influenza B by PCR NEGATIVE NEGATIVE Final    Comment: (NOTE) The Xpert Xpress SARS-CoV-2/FLU/RSV plus assay is intended as an aid in the diagnosis of influenza from Nasopharyngeal swab specimens and should not be used as a sole basis for treatment. Nasal washings and aspirates are unacceptable for Xpert Xpress SARS-CoV-2/FLU/RSV testing.  Fact Sheet for Patients: EntrepreneurPulse.com.au  Fact Sheet for Healthcare Providers: IncredibleEmployment.be  This test is not yet approved or cleared by the [place] FDA and has been authorized for detection and/or diagnosis of SARS-CoV-2 by FDA under an Emergency Use Authorization (EUA). This EUA will remain in effect (meaning this test can be used) for the duration of the COVID-19 declaration under Section 564(b)(1) of the Act, 21 U.S.C. section 360bbb-3(b)(1), unless the authorization is terminated or revoked.     Resp Syncytial Virus by PCR NEGATIVE NEGATIVE Final    Comment: (NOTE) Fact Sheet for Patients: EntrepreneurPulse.com.au  Fact Sheet for Healthcare Providers: IncredibleEmployment.be  This test is not yet approved or cleared by the [place] FDA and has been authorized for detection and/or diagnosis of SARS-CoV-2 by FDA under an Emergency Use Authorization (EUA). This EUA will remain in effect (meaning this test can be used) for the duration of the COVID-19 declaration under Section 564(b)(1) of the Act, 21 U.S.C. section 360bbb-3(b)(1), unless the authorization is terminated or revoked.  Performed at [facility], [place] [address], [place], [place] [address]   Urine Culture     Status: Abnormal   Collection Time: 06/15/22  2:42 AM   Specimen: Urine, Catheterized  Result Value  Ref Range Status   Specimen Description URINE, CATHETERIZED  Final   Special Requests   Final    NONE Performed at [facility], [place] [address], [place], [place] [address]    Culture (A)  Final    >=100,000 COLONIES/mL ENTEROBACTER AEROGENES 80,000 COLONIES/mL ESCHERICHIA COLI    Report Status 06/17/2022 FINAL  Final   Organism ID, Bacteria ENTEROBACTER AEROGENES (A)  Final   Organism ID, Bacteria ESCHERICHIA COLI (A)  Final      Susceptibility   Enterobacter aerogenes - MIC*    CEFAZOLIN RESISTANT Resistant     CEFEPIME <=0.12 SENSITIVE Sensitive     CEFTRIAXONE <=0.25 SENSITIVE Sensitive     CIPROFLOXACIN <=0.25 SENSITIVE Sensitive     GENTAMICIN <=1 SENSITIVE Sensitive     IMIPENEM 1 SENSITIVE Sensitive     NITROFURANTOIN 64 INTERMEDIATE Intermediate     TRIMETH/SULFA <=20 SENSITIVE Sensitive     PIP/TAZO <=4 SENSITIVE Sensitive     * >=100,000 COLONIES/mL ENTEROBACTER AEROGENES   Escherichia coli - MIC*    AMPICILLIN <=2 SENSITIVE Sensitive  CEFAZOLIN <=4 SENSITIVE Sensitive     CEFEPIME <=0.12 SENSITIVE Sensitive     CEFTRIAXONE <=0.25 SENSITIVE Sensitive     CIPROFLOXACIN <=0.25 SENSITIVE Sensitive     GENTAMICIN <=1 SENSITIVE Sensitive     IMIPENEM <=0.25 SENSITIVE Sensitive     NITROFURANTOIN <=16 SENSITIVE Sensitive     TRIMETH/SULFA <=20 SENSITIVE Sensitive     AMPICILLIN/SULBACTAM <=2 SENSITIVE Sensitive     PIP/TAZO <=4 SENSITIVE Sensitive     * 80,000 COLONIES/mL ESCHERICHIA COLI  MRSA Next Gen by PCR, Nasal     Status: None   Collection Time: 06/15/22  6:00 AM   Specimen: Nasal Mucosa; Nasal Swab  Result Value Ref Range Status   MRSA by PCR Next Gen NOT DETECTED NOT DETECTED Final    Comment: (NOTE) The GeneXpert MRSA Assay (FDA approved for NASAL specimens only), is one component of a comprehensive MRSA colonization surveillance program. It is not intended to diagnose MRSA infection nor to guide or monitor treatment for MRSA infections. Test  performance is not FDA approved in patients less than 84 years old. Performed at [facility], [place] [address], [place], [place] [address]     Labs: CBC: Recent Labs  Lab 06/23/22 0428  WBC 11.4*  HGB 13.1  HCT 40.2  MCV 92.0  PLT 517   Basic Metabolic Panel: Recent Labs  Lab 06/23/22 0428 06/24/22 0703  NA 137 139  K 3.0* 3.9  CL 96* 101  CO2 29 26  GLUCOSE 167* 145*  BUN 24* 27*  CREATININE 1.21* 1.10*  CALCIUM 7.8* 7.7*  MG 1.7 2.0   Liver Function Tests: No results for input(s): "AST", "ALT", "ALKPHOS", "BILITOT", "PROT", "ALBUMIN" in the last 168 hours. CBG: Recent Labs  Lab 06/25/22 1113 06/25/22 1618 06/25/22 2118 06/26/22 0716 06/26/22 1111  GLUCAP 213* 120* 156* 147* 140*    Discharge time spent: greater than 30 minutes.  Signed: Elmarie Shiley, MD [facility] 06/26/2022

## 2022-06-26 NOTE — Progress Notes (Signed)
Physical Therapy Treatment Patient Details Name: [name] MRN: [id number] DOB: 09/13/1950 Today's Date: 06/26/2022   History of Present Illness Jody Simpson is a 72 y.o. female with medical history significant for SVT, essential hypertension, hyperlipidemia, GERD, type 2 diabetes, who presented to [facility] ED from independent living facility via EMS due to generalized weakness. She slid out of her chair. Admitted for sepsis secondary to UTI and + flu.    PT Comments    Improved bed mobility, able to rise to EOB without physical assist. Attempted to rise from bed and stand with RW unsuccessfully. Using stedy able to rise however trunk remains flexed and only tolerated <15 seconds. Cues for technique. Declines working further to "save energy" for SNF d/c today. Encouraged increased effort to maximize benefits. Min assist for LEs back into bed. Pt was able to scoot without physical assist along bed. Patient will continue to benefit from skilled physical therapy services to further improve independence with functional mobility.    Recommendations for follow up therapy are one component of a multi-disciplinary discharge planning process, led by the attending physician.  Recommendations may be updated based on patient status, additional functional criteria and insurance authorization.  Follow Up Recommendations  Skilled nursing-short term rehab (<3 hours/day) Can patient physically be transported by private vehicle: No   Assistance Recommended at Discharge Intermittent Supervision/Assistance  Patient can return home with the following A lot of help with walking and/or transfers;Help with stairs or ramp for entrance;Assist for transportation;A lot of help with bathing/dressing/bathroom   Equipment Recommendations  None recommended by PT    Recommendations for Other Services       Precautions / Restrictions Precautions Precautions: Fall Precaution Comments: watch O2  sats Restrictions Weight Bearing Restrictions: No     Mobility  Bed Mobility Overal bed mobility: Needs Assistance Bed Mobility: Supine to Sit, Sit to Supine     Supine to sit: Min guard, HOB elevated Sit to supine: Min assist   General bed mobility comments: Min guard to rise with HOB elevated, slow but capable with use of rails for support. Cues to bring hips to EOB for feet flat. Min assist for LE support back into bed.    Transfers Overall transfer level: Needs assistance Equipment used: Rolling walker (2 wheels), Ambulation equipment used Transfers: Sit to/from Stand Sit to Stand: Total assist           General transfer comment: Attempted to stand from elevated be surface with use of RW, hand rest provided on Lt from chair for additional support. Performed sit<>stand with stedy, showing improved buttock clearance but unable to fully extend back and sat back down quickly after rising.    Ambulation/Gait                   Stairs             Wheelchair Mobility    Modified Rankin (Stroke Patients Only)       Balance Overall balance assessment: Needs assistance Sitting-balance support: No upper extremity supported, Feet supported Sitting balance-[name]: Good     Standing balance support: Bilateral upper extremity supported, Reliant on assistive device for balance Standing balance-[name]: Zero Standing balance comment: Stedy for support                            Cognition Arousal/Alertness: Awake/alert Behavior During Therapy: WFL for tasks assessed/performed Overall Cognitive Status: Within Functional Limits for  tasks assessed                                 General Comments: Still somewhat unaware of lack of strength. States she needs to get stronger to go home, complains that no one has worked with her on this, and then declines to work further after attempting to stand with stedy once.         Exercises      General Comments        Pertinent Vitals/Pain Pain Assessment Pain Assessment: Faces Faces Pain Scale: Hurts a little bit Pain Location: back Pain Descriptors / Indicators: Aching (itchy) Pain Intervention(s): Monitored during session, Repositioned    Home Living                          Prior Function            PT Goals (current goals can now be found in the care plan section) Acute Rehab PT Goals Patient Stated Goal: agreeable to SNF PT Goal Formulation: With patient Time For Goal Achievement: 06/30/22 Potential to Achieve Goals: Fair Progress towards PT goals: Progressing toward goals    Frequency    Min 2X/week      PT Plan Current plan remains appropriate    Co-evaluation              AM-PAC PT "6 Clicks" Mobility   Outcome Measure  Help needed turning from your back to your side while in a flat bed without using bedrails?: None Help needed moving from lying on your back to sitting on the side of a flat bed without using bedrails?: A Little Help needed moving to and from a bed to a chair (including a wheelchair)?: Total Help needed standing up from a chair using your arms (e.g., wheelchair or bedside chair)?: Total Help needed to walk in hospital room?: Total Help needed climbing 3-5 steps with a railing? : Total 6 Click Score: 11    End of Session Equipment Utilized During Treatment: Gait belt Activity Tolerance: Patient tolerated treatment well (though seems anxious) Patient left: with call bell/phone within reach;in bed;with bed alarm set Nurse Communication: Mobility status;Need for lift equipment PT Visit Diagnosis: Muscle weakness (generalized) (M62.81);Other abnormalities of gait and mobility (R26.89)     Time: 9024-0973 PT Time Calculation (min) (ACUTE ONLY): 32 min  Charges:  $Therapeutic Activity: 23-37 mins                     Candie Mile, PT, DPT Physical Therapist Acute Rehabilitation  Services [place] 06/26/2022, 3:21 PM

## 2022-06-26 NOTE — TOC Transition Note (Signed)
Transition of Care [facility]) - CM/SW Discharge Note   Patient Details  Name: Jody Simpson MRN: [id number] Date of Birth: 1951-02-17  Transition of [facility] CM/SW Contact:  Milinda Antis, [place] Phone Number: 06/26/2022, 1:34 PM   Clinical Narrative:    Patient will DC to: [facility] Anticipated DC date:  06/26/2022 Transport by: Corey Harold   Per MD patient ready for DC to SNF. RN to call report prior to discharge [phone] room 604a). RN, patient, and facility notified of DC. Discharge Summary and FL2 sent to facility. DC packet on chart. Ambulance transport will be requested for patient.   CSW will sign off for now as social work intervention is no longer needed. Please consult [place] again if new needs arise.    Final next level of care: Skilled Nursing Facility Barriers to Discharge: Barriers Resolved   Patient Goals and CMS Choice CMS Medicare.gov Compare Post Acute Care list provided to:: Patient Choice offered to / list presented to : Patient  Discharge Placement                Patient chooses bed at: [facility] Patient to be transferred to facility by: [facility] Name of family member notified: Carlis Stable (Brother) [phone] Patient and family notified of of transfer: 06/26/22  Discharge Plan and Services Additional resources added to the After Visit Summary for     Discharge Planning Services: CM Consult Post Acute Care Choice: Home Health          DME Arranged: N/A DME Agency: NA       HH Arranged: PT, OT HH Agency: [facility] Date [place]: 06/16/22 Time [place]: [id number] Representative spoke with at [place]: [place] Determinants of Health ([place]) Interventions SDOH Screenings   Food Insecurity: No Food Insecurity (06/15/2022)  Housing: Low Risk  (06/15/2022)  Transportation Needs: No Transportation Needs (06/15/2022)  Utilities: Not At Risk (06/15/2022)  Tobacco Use: Low Risk  (06/15/2022)     Readmission  Risk Interventions     No data to display

## 2022-06-26 NOTE — Progress Notes (Signed)
DISCHARGE NOTE SNF [facility] to be discharged Skilled nursing facility per MD order. Patient verbalized understanding.  Skin clean, dry and intact without evidence of skin break down, no evidence of skin tears noted. IV catheter discontinued intact. Site without signs and symptoms of complications. Dressing and pressure applied. Pt denies pain at the site currently. No complaints noted.  Patient free of lines, drains, and wounds.   Discharge packet assembled. An After Visit Summary (AVS) was printed and given to the EMS personnel. Patient escorted via stretcher and discharged to [facility] via ambulance. Report called to accepting facility; all questions and concerns addressed.   Keenan Bachelor, RN

## 2022-06-27 ENCOUNTER — Other Ambulatory Visit: Payer: Self-pay | Admitting: Nurse Practitioner

## 2022-06-27 DIAGNOSIS — R051 Acute cough: Secondary | ICD-10-CM

## 2022-06-27 DIAGNOSIS — R062 Wheezing: Secondary | ICD-10-CM

## 2022-06-27 MED ORDER — INSULIN GLARGINE 100 UNIT/ML SOLOSTAR PEN: 25.0000 [IU] | PEN_INJECTOR | Freq: Every day | SUBCUTANEOUS | 11 refills | Status: AC

## 2022-06-27 MED ORDER — FIASP FLEXTOUCH 100 UNIT/ML ~~LOC~~ SOPN: 6.0000 [IU] | PEN_INJECTOR | Freq: Three times a day (TID) | SUBCUTANEOUS | 11 refills | Status: DC

## 2022-06-27 NOTE — Progress Notes (Signed)
Initial short acting insulin ordered at dc was not covered by her insurance so [employer] sent electronically to her pharmacy

## 2022-06-27 NOTE — Progress Notes (Signed)
Semglee ordered at dc but not covered by insurance so new script for Lantus solostar pen 25 units daily sent to her pharmacy electronically

## 2022-07-01 ENCOUNTER — Other Ambulatory Visit: Payer: Self-pay

## 2022-07-01 ENCOUNTER — Encounter (HOSPITAL_COMMUNITY): Payer: Self-pay | Admitting: Emergency Medicine

## 2022-07-01 ENCOUNTER — Emergency Department (HOSPITAL_COMMUNITY): Payer: Medicare HMO

## 2022-07-01 ENCOUNTER — Inpatient Hospital Stay (HOSPITAL_COMMUNITY)
Admission: EM | Admit: 2022-07-01 | Discharge: 2022-07-08 | DRG: 189 | Disposition: A | Discharge status: Skilled Nursing Facility | Payer: Medicare HMO | Source: Skilled Nursing Facility | Attending: Internal Medicine | Admitting: Internal Medicine

## 2022-07-01 DIAGNOSIS — K219 Gastro-esophageal reflux disease without esophagitis: Secondary | ICD-10-CM | POA: Diagnosis present

## 2022-07-01 DIAGNOSIS — Z6841 Body Mass Index (BMI) 40.0 and over, adult: Secondary | ICD-10-CM

## 2022-07-01 DIAGNOSIS — Z7984 Long term (current) use of oral hypoglycemic drugs: Secondary | ICD-10-CM

## 2022-07-01 DIAGNOSIS — J9602 Acute respiratory failure with hypercapnia: Secondary | ICD-10-CM | POA: Diagnosis present

## 2022-07-01 DIAGNOSIS — Z87440 Personal history of urinary (tract) infections: Secondary | ICD-10-CM

## 2022-07-01 DIAGNOSIS — E119 Type 2 diabetes mellitus without complications: Secondary | ICD-10-CM

## 2022-07-01 DIAGNOSIS — E785 Hyperlipidemia, unspecified: Secondary | ICD-10-CM | POA: Diagnosis present

## 2022-07-01 DIAGNOSIS — Z79899 Other long term (current) drug therapy: Secondary | ICD-10-CM

## 2022-07-01 DIAGNOSIS — Z85528 Personal history of other malignant neoplasm of kidney: Secondary | ICD-10-CM | POA: Diagnosis not present

## 2022-07-01 DIAGNOSIS — Z90710 Acquired absence of both cervix and uterus: Secondary | ICD-10-CM | POA: Diagnosis not present

## 2022-07-01 DIAGNOSIS — I5032 Chronic diastolic (congestive) heart failure: Secondary | ICD-10-CM | POA: Diagnosis present

## 2022-07-01 DIAGNOSIS — Z8249 Family history of ischemic heart disease and other diseases of the circulatory system: Secondary | ICD-10-CM | POA: Diagnosis not present

## 2022-07-01 DIAGNOSIS — J9600 Acute respiratory failure, unspecified whether with hypoxia or hypercapnia: Secondary | ICD-10-CM | POA: Diagnosis present

## 2022-07-01 DIAGNOSIS — N179 Acute kidney failure, unspecified: Secondary | ICD-10-CM | POA: Diagnosis present

## 2022-07-01 DIAGNOSIS — Z91018 Allergy to other foods: Secondary | ICD-10-CM

## 2022-07-01 DIAGNOSIS — Z91040 Latex allergy status: Secondary | ICD-10-CM

## 2022-07-01 DIAGNOSIS — Z880 Allergy status to penicillin: Secondary | ICD-10-CM

## 2022-07-01 DIAGNOSIS — G9341 Metabolic encephalopathy: Secondary | ICD-10-CM | POA: Diagnosis present

## 2022-07-01 DIAGNOSIS — I10 Essential (primary) hypertension: Secondary | ICD-10-CM | POA: Diagnosis not present

## 2022-07-01 DIAGNOSIS — E1122 Type 2 diabetes mellitus with diabetic chronic kidney disease: Secondary | ICD-10-CM | POA: Diagnosis present

## 2022-07-01 DIAGNOSIS — J9601 Acute respiratory failure with hypoxia: Secondary | ICD-10-CM | POA: Diagnosis present

## 2022-07-01 DIAGNOSIS — R4182 Altered mental status, unspecified: Secondary | ICD-10-CM | POA: Diagnosis not present

## 2022-07-01 DIAGNOSIS — Z905 Acquired absence of kidney: Secondary | ICD-10-CM

## 2022-07-01 DIAGNOSIS — Z1152 Encounter for screening for COVID-19: Secondary | ICD-10-CM | POA: Diagnosis not present

## 2022-07-01 DIAGNOSIS — Z794 Long term (current) use of insulin: Secondary | ICD-10-CM | POA: Diagnosis not present

## 2022-07-01 DIAGNOSIS — L89312 Pressure ulcer of right buttock, stage 2: Secondary | ICD-10-CM | POA: Diagnosis present

## 2022-07-01 DIAGNOSIS — Z885 Allergy status to narcotic agent status: Secondary | ICD-10-CM

## 2022-07-01 DIAGNOSIS — Z981 Arthrodesis status: Secondary | ICD-10-CM

## 2022-07-01 DIAGNOSIS — Z7951 Long term (current) use of inhaled steroids: Secondary | ICD-10-CM

## 2022-07-01 DIAGNOSIS — Q602 Renal agenesis, unspecified: Secondary | ICD-10-CM

## 2022-07-01 DIAGNOSIS — Z91041 Radiographic dye allergy status: Secondary | ICD-10-CM

## 2022-07-01 DIAGNOSIS — Z803 Family history of malignant neoplasm of breast: Secondary | ICD-10-CM

## 2022-07-01 DIAGNOSIS — I130 Hypertensive heart and chronic kidney disease with heart failure and stage 1 through stage 4 chronic kidney disease, or unspecified chronic kidney disease: Secondary | ICD-10-CM | POA: Diagnosis present

## 2022-07-01 DIAGNOSIS — J45909 Unspecified asthma, uncomplicated: Secondary | ICD-10-CM | POA: Diagnosis present

## 2022-07-01 DIAGNOSIS — N1831 Chronic kidney disease, stage 3a: Secondary | ICD-10-CM | POA: Diagnosis present

## 2022-07-01 DIAGNOSIS — K580 Irritable bowel syndrome with diarrhea: Secondary | ICD-10-CM | POA: Diagnosis present

## 2022-07-01 DIAGNOSIS — E662 Morbid (severe) obesity with alveolar hypoventilation: Secondary | ICD-10-CM | POA: Diagnosis present

## 2022-07-01 DIAGNOSIS — M109 Gout, unspecified: Secondary | ICD-10-CM | POA: Diagnosis present

## 2022-07-01 DIAGNOSIS — Z833 Family history of diabetes mellitus: Secondary | ICD-10-CM

## 2022-07-01 DIAGNOSIS — I459 Conduction disorder, unspecified: Secondary | ICD-10-CM | POA: Diagnosis present

## 2022-07-01 DIAGNOSIS — Z91048 Other nonmedicinal substance allergy status: Secondary | ICD-10-CM

## 2022-07-01 DIAGNOSIS — E8729 Other acidosis: Secondary | ICD-10-CM | POA: Diagnosis present

## 2022-07-01 DIAGNOSIS — Z886 Allergy status to analgesic agent status: Secondary | ICD-10-CM

## 2022-07-01 DIAGNOSIS — Z888 Allergy status to other drugs, medicaments and biological substances status: Secondary | ICD-10-CM

## 2022-07-01 LAB — COMPREHENSIVE METABOLIC PANEL
ALT: 11 U/L (ref 0–44)
AST: 16 U/L (ref 15–41)
Albumin: 3.3 g/dL — ABNORMAL LOW (ref 3.5–5.0)
Alkaline Phosphatase: 100 U/L (ref 38–126)
Anion gap: 13 (ref 5–15)
BUN: 20 mg/dL (ref 8–23)
CO2: 28 mmol/L (ref 22–32)
Calcium: 8.2 mg/dL — ABNORMAL LOW (ref 8.9–10.3)
Chloride: 101 mmol/L (ref 98–111)
Creatinine, Ser: 2.3 mg/dL — ABNORMAL HIGH (ref 0.44–1.00)
GFR, Estimated: 22 mL/min — ABNORMAL LOW (ref >60)
Glucose, Bld: 153 mg/dL — ABNORMAL HIGH (ref 70–99)
Potassium: 4.4 mmol/L (ref 3.5–5.1)
Sodium: 142 mmol/L (ref 135–145)
Total Bilirubin: 0.7 mg/dL (ref 0.3–1.2)
Total Protein: 7.1 g/dL (ref 6.5–8.1)

## 2022-07-01 LAB — URINALYSIS, W/ REFLEX TO CULTURE (INFECTION SUSPECTED)
Bilirubin Urine: NEGATIVE
Glucose, UA: 50 mg/dL — AB
Hgb urine dipstick: NEGATIVE
Ketones, ur: NEGATIVE mg/dL
Nitrite: NEGATIVE
Protein, ur: >=300 mg/dL — AB
Specific Gravity, Urine: 1.016 (ref 1.005–1.030)
WBC, UA: >50 WBC/hpf (ref 0–5)
pH: 5 (ref 5.0–8.0)

## 2022-07-01 LAB — CBC WITH DIFFERENTIAL/PLATELET
Abs Immature Granulocytes: 0.1 10*3/uL — ABNORMAL HIGH (ref 0.00–0.07)
Basophils Absolute: 0.2 10*3/uL — ABNORMAL HIGH (ref 0.0–0.1)
Basophils Relative: 2 %
Eosinophils Absolute: 0.5 10*3/uL (ref 0.0–0.5)
Eosinophils Relative: 5 %
HCT: 41.7 % (ref 36.0–46.0)
Hemoglobin: 12.4 g/dL (ref 12.0–15.0)
Immature Granulocytes: 1 %
Lymphocytes Relative: 15 %
Lymphs Abs: 1.6 10*3/uL (ref 0.7–4.0)
MCH: 29.5 pg (ref 26.0–34.0)
MCHC: 29.7 g/dL — ABNORMAL LOW (ref 30.0–36.0)
MCV: 99 fL (ref 80.0–100.0)
Monocytes Absolute: 1.3 10*3/uL — ABNORMAL HIGH (ref 0.1–1.0)
Monocytes Relative: 12 %
Neutro Abs: 7 10*3/uL (ref 1.7–7.7)
Neutrophils Relative %: 65 %
Platelets: 390 10*3/uL (ref 150–400)
RBC: 4.21 MIL/uL (ref 3.87–5.11)
RDW: 15.3 % (ref 11.5–15.5)
WBC: 10.6 10*3/uL — ABNORMAL HIGH (ref 4.0–10.5)
nRBC: 0 % (ref 0.0–0.2)

## 2022-07-01 LAB — BLOOD GAS, VENOUS
Acid-Base Excess: 4 mmol/L — ABNORMAL HIGH (ref 0.0–2.0)
Bicarbonate: 32.5 mmol/L — ABNORMAL HIGH (ref 20.0–28.0)
O2 Saturation: 91.6 %
Patient temperature: 37
pCO2, Ven: 66 mmHg — ABNORMAL HIGH (ref 44–60)
pH, Ven: 7.3 (ref 7.25–7.43)
pO2, Ven: 58 mmHg — ABNORMAL HIGH (ref 32–45)

## 2022-07-01 LAB — BRAIN NATRIURETIC PEPTIDE: B Natriuretic Peptide: 400.6 pg/mL — ABNORMAL HIGH (ref 0.0–100.0)

## 2022-07-01 LAB — RESP PANEL BY RT-PCR (RSV, FLU A&B, COVID)  RVPGX2
Influenza A by PCR: NEGATIVE
Influenza B by PCR: NEGATIVE
Resp Syncytial Virus by PCR: NEGATIVE
SARS Coronavirus 2 by RT PCR: NEGATIVE

## 2022-07-01 LAB — RAPID URINE DRUG SCREEN, HOSP PERFORMED
Amphetamines: NOT DETECTED
Barbiturates: NOT DETECTED
Benzodiazepines: NOT DETECTED
Cocaine: NOT DETECTED
Opiates: NOT DETECTED
Tetrahydrocannabinol: NOT DETECTED

## 2022-07-01 LAB — CBG MONITORING, ED: Glucose-Capillary: 156 mg/dL — ABNORMAL HIGH (ref 70–99)

## 2022-07-01 LAB — TROPONIN I (HIGH SENSITIVITY)
Troponin I (High Sensitivity): 51 ng/L — ABNORMAL HIGH (ref <18)
Troponin I (High Sensitivity): 40 ng/L — ABNORMAL HIGH (ref <18)

## 2022-07-01 LAB — LACTIC ACID, PLASMA: Lactic Acid, Venous: 1.2 mmol/L (ref 0.5–1.9)

## 2022-07-01 LAB — AMMONIA: Ammonia: 17 umol/L (ref 9–35)

## 2022-07-01 LAB — ETHANOL: Alcohol, Ethyl (B): <10 mg/dL (ref <10)

## 2022-07-01 LAB — GLUCOSE, CAPILLARY: Glucose-Capillary: 201 mg/dL — ABNORMAL HIGH (ref 70–99)

## 2022-07-01 MED ORDER — IPRATROPIUM-ALBUTEROL 0.5-2.5 (3) MG/3ML IN SOLN
3.0000 mL | RESPIRATORY_TRACT | Status: AC
  Administered 2022-07-01 (×3): 3 mL via RESPIRATORY_TRACT
  Filled 2022-07-01: qty 9

## 2022-07-01 MED ORDER — ACETAMINOPHEN 650 MG RE SUPP: 650.0000 mg | Freq: Four times a day (QID) | RECTAL | Status: DC | PRN

## 2022-07-01 MED ORDER — SODIUM CHLORIDE 0.9 % IV SOLN
1.0000 g | Freq: Once | INTRAVENOUS | Status: AC
  Administered 2022-07-01: 1 g via INTRAVENOUS
  Filled 2022-07-01: qty 10

## 2022-07-01 MED ORDER — HYDRALAZINE HCL 50 MG PO TABS
100.0000 mg | ORAL_TABLET | Freq: Three times a day (TID) | ORAL | Status: DC
  Administered 2022-07-02 – 2022-07-08 (×17): 100 mg via ORAL
  Filled 2022-07-01 (×19): qty 2

## 2022-07-01 MED ORDER — SODIUM CHLORIDE 0.9 % IV SOLN
500.0000 mg | Freq: Once | INTRAVENOUS | Status: AC
  Administered 2022-07-01: 500 mg via INTRAVENOUS
  Filled 2022-07-01: qty 5

## 2022-07-01 MED ORDER — LACTATED RINGERS IV SOLN: INTRAVENOUS | Status: DC

## 2022-07-01 MED ORDER — INSULIN ASPART 100 UNIT/ML IJ SOLN
0.0000 [IU] | INTRAMUSCULAR | Status: DC
  Administered 2022-07-01 – 2022-07-02 (×4): 5 [IU] via SUBCUTANEOUS
  Administered 2022-07-02: 8 [IU] via SUBCUTANEOUS
  Administered 2022-07-02 (×2): 5 [IU] via SUBCUTANEOUS
  Administered 2022-07-03 (×2): 3 [IU] via SUBCUTANEOUS
  Administered 2022-07-03: 2 [IU] via SUBCUTANEOUS
  Administered 2022-07-03: 3 [IU] via SUBCUTANEOUS
  Administered 2022-07-04: 2 [IU] via SUBCUTANEOUS
  Administered 2022-07-04: 5 [IU] via SUBCUTANEOUS
  Administered 2022-07-04 – 2022-07-05 (×5): 3 [IU] via SUBCUTANEOUS
  Administered 2022-07-05: 5 [IU] via SUBCUTANEOUS
  Administered 2022-07-06 (×2): 3 [IU] via SUBCUTANEOUS
  Administered 2022-07-06 (×2): 2 [IU] via SUBCUTANEOUS
  Administered 2022-07-07: 5 [IU] via SUBCUTANEOUS
  Administered 2022-07-07 (×2): 2 [IU] via SUBCUTANEOUS
  Administered 2022-07-07: 5 [IU] via SUBCUTANEOUS
  Administered 2022-07-08 (×2): 3 [IU] via SUBCUTANEOUS
  Filled 2022-07-01: qty 0.15

## 2022-07-01 MED ORDER — ACETAMINOPHEN 325 MG PO TABS
650.0000 mg | ORAL_TABLET | Freq: Four times a day (QID) | ORAL | Status: DC | PRN
  Administered 2022-07-04 – 2022-07-07 (×5): 650 mg via ORAL
  Filled 2022-07-01 (×5): qty 2

## 2022-07-01 MED ORDER — LACTATED RINGERS IV BOLUS
1000.0000 mL | Freq: Once | INTRAVENOUS | Status: AC
  Administered 2022-07-01: 1000 mL via INTRAVENOUS

## 2022-07-01 MED ORDER — BENZONATATE 100 MG PO CAPS
100.0000 mg | ORAL_CAPSULE | Freq: Three times a day (TID) | ORAL | Status: DC | PRN
  Administered 2022-07-07 – 2022-07-08 (×3): 100 mg via ORAL
  Filled 2022-07-01 (×3): qty 1

## 2022-07-01 MED ORDER — INSULIN DETEMIR 100 UNIT/ML ~~LOC~~ SOLN
12.0000 [IU] | Freq: Every day | SUBCUTANEOUS | Status: DC
  Administered 2022-07-01 – 2022-07-07 (×7): 12 [IU] via SUBCUTANEOUS
  Filled 2022-07-01 (×8): qty 0.12

## 2022-07-01 MED ORDER — METHYLPREDNISOLONE SODIUM SUCC 125 MG IJ SOLR
125.0000 mg | Freq: Once | INTRAMUSCULAR | Status: AC
  Administered 2022-07-01: 125 mg via INTRAVENOUS
  Filled 2022-07-01: qty 2

## 2022-07-01 MED ORDER — ALLOPURINOL 300 MG PO TABS
300.0000 mg | ORAL_TABLET | Freq: Every day | ORAL | Status: DC
  Administered 2022-07-03 – 2022-07-08 (×6): 300 mg via ORAL
  Filled 2022-07-01 (×6): qty 1

## 2022-07-01 MED ORDER — HEPARIN SODIUM (PORCINE) 5000 UNIT/ML IJ SOLN
5000.0000 [IU] | Freq: Three times a day (TID) | INTRAMUSCULAR | Status: DC
  Administered 2022-07-01 – 2022-07-08 (×20): 5000 [IU] via SUBCUTANEOUS
  Filled 2022-07-01 (×20): qty 1

## 2022-07-01 NOTE — ED Provider Notes (Signed)
[place] [facility] Provider Note   CSN: [id number] Arrival date & time: 07/01/22  1651     History  Chief Complaint  Patient presents with   Altered Mental Status    Jody Simpson is a 72 y.o. female.   Altered Mental Status    72 year old female with medical history significant for DM 2, HTN, morbid obesity, nontraumatic rhabdomyolysis,, paroxysmal atrial tachycardia who presents to the emergency department from a skilled nursing facility with concern for altered mental status.  The patient was recently admitted to the hospital from 1/14 to 06/26/2022 with a diagnosis of sepsis from Enterococcus/E. coli UTI, generalized deconditioning, influenza A infection, CHF exacerbation.  Due to her deconditioning, she was discharged to a skilled nursing facility.  Per EMS, the patient's baseline mental status is alert and oriented and conversational.  She is currently somnolent, GCS 10, alert and oriented x 3 after arousal and painful stimulation.  Unable to obtain further history due to patient mental status.  The patient was alert and reportedly last seen well last night and woke up this morning with a change in mental status.  Per EMS, the patient was vitally stable, tachycardic heart rate 120, BP 130/150, tachypneic RR 40, 90% SpO2 on room air with improvement on a nonrebreather.  Of note, during the patient's recent hospitalization for sepsis from UTI, she grew out both E. coli and Enterobacter that was sensitive to Rocephin.  The patient completed a 5-day course of IV Rocephin in the hospital prior to discharge on 1/25.  Home Medications Prior to Admission medications   Medication Sig Start Date End Date Taking? Authorizing Provider  acetaminophen (TYLENOL) 325 MG tablet Take 650 mg by mouth every 4 (four) hours as needed for mild pain.    [provider]  albuterol (VENTOLIN HFA) 108 (90 Base) MCG/ACT inhaler Inhale 2 puffs into the lungs  every 4 (four) hours as needed for wheezing or shortness of breath.    [provider]  allopurinol (ZYLOPRIM) 300 MG tablet Take 300 mg by mouth daily. 04/11/16   [provider]  baclofen (LIORESAL) 10 MG tablet Take 10 mg by mouth daily as needed for muscle spasms.    [provider]  benzonatate (TESSALON) 100 MG capsule Take 100 mg by mouth 3 (three) times daily as needed for cough. 06/14/22   [provider]  famotidine (PEPCID) 20 MG tablet Take 20 mg by mouth 2 (two) times daily.    [provider]  fluticasone (FLONASE) 50 MCG/ACT nasal spray Place 2 sprays into both nostrils daily as needed for allergies.    [provider]  furosemide (LASIX) 20 MG tablet Take 1 tablet (20 mg total) by mouth daily as needed for edema. 06/24/22 06/24/23  [place] Eric J, DO  glipiZIDE (GLUCOTROL) 10 MG tablet Take 10 mg by mouth 2 (two) times daily. 04/14/16   [provider]  hydrALAZINE (APRESOLINE) 100 MG tablet Take 1 tablet (100 mg total) by mouth every 8 (eight) hours. 06/24/22 09/22/22  [place] Donnamarie Poag, DO  insulin aspart (FIASP FLEXTOUCH) 100 UNIT/ML FlexTouch Pen Inject 6 Units into the skin with breakfast, with lunch, and with evening meal. 06/27/22   Samella Parr, NP  insulin aspart (NOVOLOG) 100 UNIT/ML injection Inject 6 Units into the skin 3 (three) times daily with meals. 06/26/22   Regalado, Belkys A, MD  insulin glargine (LANTUS) 100 UNIT/ML Solostar Pen Inject 25 Units into the skin daily.  06/27/22   Samella Parr, NP  insulin glargine-yfgn (SEMGLEE) 100 UNIT/ML injection Inject 0.25 mLs (25 Units total) into the skin daily. 06/27/22   Regalado, Belkys A, MD  lisinopril (ZESTRIL) 40 MG tablet Take 1 tablet (40 mg total) by mouth daily. 06/25/22 09/23/22  [place] Donnamarie Poag, DO  loratadine (CLARITIN) 10 MG tablet Take 10 mg by mouth daily.    [provider]  montelukast (SINGULAIR) 10 MG tablet Take 10 mg by mouth at bedtime.     [provider]  spironolactone (ALDACTONE) 25 MG tablet Take 1 tablet (25 mg total) by mouth daily. 06/25/22 09/23/22  [place] Eric J, DO      Allergies    Aspirin, Banana, Cholestyramine, Codeine, Diazepam, Hydrocodone, Ibuprofen, Iodine, Latex, Meperidine hcl, Metformin, Naproxen sodium, Penicillins, Pravastatin sodium, Sitagliptin phosphate, and Tape    Review of Systems   Review of Systems  Unable to perform ROS: Mental status change    Physical Exam Updated Vital Signs BP 112/62 (BP Location: Right Arm)   Pulse 95   Temp 98.2 F (36.8 C) (Oral)   Resp (!) 21   Ht '5\' 6"'$  (1.676 m)   Wt (!) 139.4 kg   SpO2 95%   BMI 49.60 kg/m  Physical Exam Vitals and nursing note reviewed.  Constitutional:      Appearance: She is obese. She is ill-appearing.     Comments: GCS 11 (2-3-6), somnolent but arousable  HENT:     Head: Normocephalic and atraumatic.  Eyes:     Conjunctiva/sclera: Conjunctivae normal.     Pupils: Pupils are equal, round, and reactive to light.  Cardiovascular:     Rate and Rhythm: Regular rhythm. Tachycardia present.  Pulmonary:     Effort: Pulmonary effort is normal. No respiratory distress.     Breath sounds: Wheezing and rhonchi present.  Abdominal:     General: There is no distension.     Tenderness: There is no guarding.  Musculoskeletal:        General: No deformity or signs of injury.     Cervical back: Neck supple.  Skin:    Findings: Rash present. No lesion.     Comments: Left upper arm erythematous rash, no warmth, appears to be dermatitis  Neurological:     General: No focal deficit present.     Mental Status: Mental status is at baseline.     Comments: Moving all four extremities and will withdraw to painful stimuli     ED Results / Procedures / Treatments   Labs (all labs ordered are listed, but only abnormal results are displayed) Labs Reviewed  COMPREHENSIVE METABOLIC PANEL - Abnormal; Notable for the following  components:      Result Value   Glucose, Bld 153 (*)    Creatinine, Ser 2.30 (*)    Calcium 8.2 (*)    Albumin 3.3 (*)    GFR, Estimated 22 (*)    All other components within normal limits  CBC WITH DIFFERENTIAL/PLATELET - Abnormal; Notable for the following components:   WBC 10.6 (*)    MCHC 29.7 (*)    Monocytes Absolute 1.3 (*)    Basophils Absolute 0.2 (*)    Abs Immature Granulocytes 0.10 (*)    All other components within normal limits  BLOOD GAS, VENOUS - Abnormal; Notable for the following components:   pCO2, Ven 66 (*)    pO2, Ven 58 (*)    Bicarbonate 32.5 (*)    Acid-Base Excess 4.0 (*)  All other components within normal limits  URINALYSIS, W/ REFLEX TO CULTURE (INFECTION SUSPECTED) - Abnormal; Notable for the following components:   Color, Urine AMBER (*)    APPearance CLOUDY (*)    Glucose, UA 50 (*)    Protein, ur >=300 (*)    Leukocytes,Ua MODERATE (*)    Bacteria, UA FEW (*)    All other components within normal limits  BRAIN NATRIURETIC PEPTIDE - Abnormal; Notable for the following components:   B Natriuretic Peptide 400.6 (*)    All other components within normal limits  GLUCOSE, CAPILLARY - Abnormal; Notable for the following components:   Glucose-Capillary 201 (*)    All other components within normal limits  CBG MONITORING, ED - Abnormal; Notable for the following components:   Glucose-Capillary 156 (*)    All other components within normal limits  TROPONIN I (HIGH SENSITIVITY) - Abnormal; Notable for the following components:   Troponin I (High Sensitivity) 51 (*)    All other components within normal limits  TROPONIN I (HIGH SENSITIVITY) - Abnormal; Notable for the following components:   Troponin I (High Sensitivity) 40 (*)    All other components within normal limits  CULTURE, BLOOD (ROUTINE X 2)  RESP PANEL BY RT-PCR (RSV, FLU A&B, COVID)  RVPGX2  CULTURE, BLOOD (ROUTINE X 2)  AMMONIA  LACTIC ACID, PLASMA  ETHANOL  RAPID URINE DRUG  SCREEN, HOSP PERFORMED  BASIC METABOLIC PANEL  CBC WITH DIFFERENTIAL/PLATELET  BRAIN NATRIURETIC PEPTIDE    EKG EKG Interpretation  Date/Time:  Tuesday July 01 2022 17:15:35 EST Ventricular Rate:  105 PR Interval:    QRS Duration: 102 QT Interval:  378 QTC Calculation: 500 R Axis:   -13 Text Interpretation: Atrial fibrillation Incomplete RBBB and LAFB Probable anterior infarct, old Confirmed by Regan Lemming ([id number]) on 07/01/2022 8:06:56 PM  Radiology CT Chest Wo Contrast  Result Date: 07/01/2022 CLINICAL DATA:  Altered mental status EXAM: CT CHEST WITHOUT CONTRAST TECHNIQUE: Multidetector CT imaging of the chest was performed following the standard protocol without IV contrast. RADIATION DOSE REDUCTION: This exam was performed according to the departmental dose-optimization program which includes automated exposure control, adjustment of the mA and/or kV according to patient size and/or use of iterative reconstruction technique. COMPARISON:  Radiograph 07/01/2022 FINDINGS: Cardiovascular: Cardiomegaly. No pericardial effusion. Aortic valve and mitral annular calcification. Coronary artery and aortic atherosclerotic calcification. Mediastinum/Nodes: Shotty mediastinal and hilar lymph nodes are favored reactive. The largest is a AP window node measuring 8 mm (3/49). Unremarkable esophagus. Trachea is patent. Lungs/Pleura: Respiratory motion obscures detail. Expiratory phase scan. Bibasilar atelectasis or infiltrates. Nodular predominantly ground-glass opacities in the anterior right upper lobe and lingula. The largest measures 1.5 cm (3/55) in the anterior right upper lobe. No pneumothorax or pleural effusion. Mosaic attenuation compatible with air trapping. Upper Abdomen: No acute abnormality. Musculoskeletal: No acute fracture. IMPRESSION: 1. Constellation of findings suggestive of bronchitis/reactive airways. Predominantly ground-glass nodules measuring up to 1.5 cm are favored  infectious/inflammatory. Non-contrast chest CT at 3-6 months is recommended after treatment. If nodules persist, subsequent management will be based upon the most suspicious nodule(s). This recommendation follows the consensus statement: Guidelines for Management of Incidental Pulmonary Nodules Detected on CT Images: From the Fleischner Society 2017; Radiology 2017; 284:228-243. 2. Cardiomegaly and coronary artery calcification. Aortic Atherosclerosis (ICD10-I70.0). Electronically Signed   By: Placido Sou M.D.   On: 07/01/2022 19:31   CT HEAD WO CONTRAST  Result Date: 07/01/2022 CLINICAL DATA:  Altered mental status EXAM: CT HEAD  WITHOUT CONTRAST TECHNIQUE: Contiguous axial images were obtained from the base of the skull through the vertex without intravenous contrast. RADIATION DOSE REDUCTION: This exam was performed according to the departmental dose-optimization program which includes automated exposure control, adjustment of the mA and/or kV according to patient size and/or use of iterative reconstruction technique. COMPARISON:  06/15/2022 FINDINGS: Brain: No evidence of acute infarction, hemorrhage, hydrocephalus, extra-axial collection or mass lesion/mass effect. Subcortical white matter and periventricular small vessel ischemic changes. Vascular: Intracranial atherosclerosis. Skull: Normal. Negative for fracture or focal lesion. Sinuses/Orbits: The visualized paranasal sinuses are essentially clear. The mastoid air cells are unopacified. Other: None. IMPRESSION: No evidence of acute intracranial abnormality. Small vessel ischemic changes. Electronically Signed   By: Julian Hy M.D.   On: 07/01/2022 19:25   DG Chest Port 1 View  Result Date: 07/01/2022 CLINICAL DATA:  Acute onset of altered mental status. EXAM: PORTABLE CHEST 1 VIEW COMPARISON:  Radiographs 06/15/2022 and 07/04/2016. FINDINGS: 1741 hours. Patient rotation to the left. Stable mild cardiomegaly and aortic atherosclerosis.  There is mildly increased opacity peripherally at the left lung base which could reflect an infiltrate and/or small pleural effusion. The right lung appears clear. No evidence of pneumothorax. Unchanged small calcifications in the left paratracheal region. No acute osseous findings are evident. There are degenerative changes in the spine. Telemetry leads overlie the chest. IMPRESSION: Mildly increased opacity peripherally at the left lung base which could reflect an infiltrate and/or small pleural effusion. Electronically Signed   By: Richardean Sale M.D.   On: 07/01/2022 17:54    Procedures .Critical Care  Performed by: Regan Lemming, MD Authorized by: Regan Lemming, MD   Critical care provider statement:    Critical care time (minutes):  30   Critical care was necessary to treat or prevent imminent or life-threatening deterioration of the following conditions:  Respiratory failure   Critical care was time spent personally by me on the following activities:  Development of treatment plan with patient or surrogate, discussions with consultants, evaluation of patient's response to treatment, examination of patient, ordering and review of laboratory studies, ordering and review of radiographic studies, ordering and performing treatments and interventions, pulse oximetry, re-evaluation of patient's condition and review of old charts   Care discussed with: admitting provider       Medications Ordered in ED Medications  lactated ringers infusion ( Intravenous New Bag/Given 07/01/22 2353)  insulin detemir (LEVEMIR) injection 12 Units (12 Units Subcutaneous Given 07/01/22 2331)  insulin aspart (novoLOG) injection 0-15 Units (5 Units Subcutaneous Given 07/01/22 2337)  allopurinol (ZYLOPRIM) tablet 300 mg (has no administration in time range)  benzonatate (TESSALON) capsule 100 mg (has no administration in time range)  hydrALAZINE (APRESOLINE) tablet 100 mg (100 mg Oral Not Given 07/01/22 2330)  heparin  injection 5,000 Units (5,000 Units Subcutaneous Given 07/01/22 2329)  acetaminophen (TYLENOL) tablet 650 mg (has no administration in time range)    Or  acetaminophen (TYLENOL) suppository 650 mg (has no administration in time range)  ipratropium-albuterol (DUONEB) 0.5-2.5 (3) MG/3ML nebulizer solution 3 mL (3 mLs Nebulization Given 07/01/22 1943)  methylPREDNISolone sodium succinate (SOLU-MEDROL) 125 mg/2 mL injection 125 mg (125 mg Intravenous Given 07/01/22 1958)  cefTRIAXone (ROCEPHIN) 1 g in sodium chloride 0.9 % 100 mL IVPB (1 g Intravenous New Bag/Given 07/01/22 1957)  azithromycin (ZITHROMAX) 500 mg in sodium chloride 0.9 % 250 mL IVPB (500 mg Intravenous New Bag/Given 07/01/22 1958)  lactated ringers bolus 1,000 mL (1,000 mLs Intravenous Bolus 07/01/22 2102)  ED Course/ Medical Decision Making/ A&P Clinical Course as of 07/02/22 0101  Tue Jul 01, 2022  1904 pCO2, Ven(!): 66 [JL]  1904 pH, Ven: 7.3 [JL]    Clinical Course User Index [JL] Regan Lemming, MD                             Medical Decision Making Amount and/or Complexity of Data Reviewed Labs: ordered. Decision-making details documented in ED Course. Radiology: ordered.  Risk Prescription drug management. Decision regarding hospitalization.   72 year old female with medical history significant for DM 2, HTN, morbid obesity, nontraumatic rhabdomyolysis,, paroxysmal atrial tachycardia who presents to the emergency department from a skilled nursing facility with concern for altered mental status.  The patient was recently admitted to the hospital from 1/14 to 06/26/2022 with a diagnosis of sepsis from Enterococcus/E. coli UTI, generalized deconditioning, influenza A infection, CHF exacerbation.  Due to her deconditioning, she was discharged to a skilled nursing facility.  Per EMS, the patient's baseline mental status is alert and oriented and conversational.  She is currently somnolent, GCS 10, alert and oriented x 3 after  arousal and painful stimulation.  Unable to obtain further history due to patient mental status.  The patient was alert and reportedly last seen well last night and woke up this morning with a change in mental status.  Per EMS, the patient was vitally stable, tachycardic heart rate 120, BP 130/150, tachypneic RR 40, 90% SpO2 on room air with improvement on a nonrebreather.  Of note, during the patient's recent hospitalization for sepsis from UTI, she grew out both E. coli and Enterobacter that was sensitive to Rocephin.  The patient completed a 5-day course of IV Rocephin in the hospital prior to discharge on 1/25.  On arrival, the patient was afebrile, she was tachycardic and tachypneic with some concern for respiratory source, saturating 91 to 89% on room air, subsequently placed on oxygen.  Differential diagnosis includes viral URI, CHF, pneumonia, encephalopathy from UTI, respiratory failure to include hypercarbia, less likely CVA, considered other toxic metabolic derangement, considered other infectious etiology such as genitourinary source of infection.  IV access was obtained and the patient was administered IV Rocephin, IV azithromycin and IV Solu-Medrol.  She had wheezing and rhonchi noted on physical exam.  Her VBG revealed evidence of hypercarbia with a respiratory acidosis noted and she was escalated to BiPAP.  Her mental status improved following BiPAP administration.  She was administered DuoNebs x 3 given her wheezing on exam.  Laboratory evaluation significant for an AKI with a serum creatinine of 2.30 up from a baseline of 1.1, mild hyperglycemia to 153, mild troponinemia to 51, BNP mildly elevated to 400, decreased from prior measurements in the hospital 2 weeks ago, UDS negative, lactic acid normal, CBC with a mild leukocytosis to 10.6.  Urinalysis with moderate leukocytes, greater than 50 WBCs, few bacteria present, squamous cells present indicating contaminated sample.  Covid 19,  influenza, RSV PCR testing was collected and resulted negative, ammonia was normal.  Due to patient encephalopathy, CT head was performed and revealed no acute intracranial abnormality.  The patient's neurologic exam reveals 5 out of 5 strength in all 4 extremities with no focal deficit.  CT chest without contrast was performed given the patient's AKI and poor GFR. IMPRESSION:  1. Constellation of findings suggestive of bronchitis/reactive  airways. Predominantly ground-glass nodules measuring up to 1.5 cm  are favored infectious/inflammatory. Non-contrast chest CT at  3-6  months is recommended after treatment. If nodules persist,  subsequent management will be based upon the most suspicious  nodule(s). This recommendation follows the consensus statement:  Guidelines for Management of Incidental Pulmonary Nodules Detected  on CT Images: From the Fleischner Society 2017; Radiology 2017;  284:228-243.  2. Cardiomegaly and coronary artery calcification.    Aortic Atherosclerosis (ICD10-I70.0).   The patient's mental status continued to improve following BiPAP and she was notably following commands with no notable neurologic deficits.  Lower concern for acute CVA at this time.  Given the patient's hypoxic and hypercarbic respiratory failure requiring BiPAP administration, concern for viral bronchitis versus developing bacterial pneumonia, AKI, altered mental status encephalopathy, hospitalist medicine was consulted for admission, Dr. Claria Dice accepting.   Final Clinical Impression(s) / ED Diagnoses Final diagnoses:  Altered mental status, unspecified altered mental status type  AKI (acute kidney injury) ([place])  Acute respiratory failure with hypoxia and hypercapnia [facility])    Rx / DC Orders ED Discharge Orders     None         Regan Lemming, MD 07/02/22 0101

## 2022-07-01 NOTE — H&P (Incomplete)
PCP:   Equiptment, [facility]   Chief Complaint:  Confusion  HPI: This is a 72 year old female with past medical history of extreme morbid obesity, SVT, essential hypertension, hyperlipidemia, GERD, type 2 diabetes.  Patient recently admitted 1/14 to 1/25 with sepsis secondary to E. coli UTI, influenza, heart failure diastolic and physical deconditioning.  Patient declined skilled nursing return to independent living center.  Denies she was brought in by EMS, confused.  GCS 10, arousable.  Unable to obtain history  In the ER patient sepsis due to HR 103, tachypneic RR 40, satting 90% on room air and confusion.  CT chest shows  suggestive of bronchitis/reactive airways. Predominantly ground-glass nodules measuring up to 1.5 cm are favored infectious/inflammatory  Presents arousable to pain.  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 Authorizing Provider  acetaminophen (TYLENOL) 325 MG tablet Take 650 mg by mouth every 4 (four) hours as needed for mild pain.    [provider]  albuterol (VENTOLIN HFA) 108 (90 Base)  MCG/ACT inhaler Inhale 2 puffs into the lungs every 4 (four) hours as needed for wheezing or shortness of breath.    [provider]  allopurinol (ZYLOPRIM) 300 MG tablet Take 300 mg by mouth daily. 04/11/16   [provider]  baclofen (LIORESAL) 10 MG tablet Take 10 mg by mouth daily as needed for muscle spasms.    [provider]  benzonatate (TESSALON) 100 MG capsule Take 100 mg by mouth 3 (three) times daily as needed for cough. 06/14/22   [provider]  famotidine (PEPCID) 20 MG tablet Take 20 mg by mouth 2 (two) times daily.    [provider]  fluticasone (FLONASE) 50 MCG/ACT nasal spray Place 2 sprays into both nostrils daily as needed for allergies.    [provider]  furosemide (LASIX) 20 MG tablet Take 1 tablet (20 mg total) by mouth daily as needed for edema. 06/24/22 06/24/23  [place] Eric J, DO  glipiZIDE (GLUCOTROL) 10 MG tablet Take 10 mg by mouth 2 (two) times daily. 04/14/16   [provider]  hydrALAZINE (APRESOLINE) 100 MG tablet Take 1 tablet (100 mg total) by mouth every 8 (eight) hours. 06/24/22 09/22/22  [place] Donnamarie Poag, DO  insulin aspart (FIASP FLEXTOUCH) 100 UNIT/ML FlexTouch Pen Inject 6 Units into the skin with breakfast, with lunch, and with evening meal. 06/27/22   Samella Parr, NP  insulin aspart (NOVOLOG) 100 UNIT/ML injection Inject  6 Units into the skin 3 (three) times daily with meals. 06/26/22   Regalado, Belkys A, MD  insulin glargine (LANTUS) 100 UNIT/ML Solostar Pen Inject 25 Units into the skin daily. 06/27/22   Samella Parr, NP  insulin glargine-yfgn (SEMGLEE) 100 UNIT/ML injection Inject 0.25 mLs (25 Units total) into the skin daily. 06/27/22   Regalado, Belkys A, MD  lisinopril (ZESTRIL) 40 MG tablet Take 1 tablet (40 mg total) by mouth daily. 06/25/22 09/23/22  [place] Donnamarie Poag, DO  loratadine (CLARITIN) 10 MG tablet Take 10 mg by mouth daily.    [provider]  montelukast (SINGULAIR)  10 MG tablet Take 10 mg by mouth at bedtime.    [provider]  spironolactone (ALDACTONE) 25 MG tablet Take 1 tablet (25 mg total) by mouth daily. 06/25/22 09/23/22  [place] Eric J, DO    Allergies:   Allergies  Allergen Reactions   Aspirin     REACTION: stomach irritation   Banana    Cholestyramine     REACTION: Diarrhea and nausea   Codeine     REACTION: stomach irritant   Diazepam     REACTION: hyper   Hydrocodone    Ibuprofen Other (See Comments)   Iodine    Latex    Meperidine Hcl    Metformin     REACTION: Diarrhea   Naproxen Sodium     REACTION: stomach irritant   Penicillins    Pravastatin Sodium     REACTION: Flu like symptoms   Sitagliptin Phosphate     REACTION: Caused elevated CBGs   Tape Rash    Tegaderm transparent plastic film    Social History:  reports that she has never smoked. She has never used smokeless tobacco. She reports that she does not drink alcohol and does not use drugs.  Family History: Family History  Problem Relation Age of Onset   CAD Father        hx of CABG   Heart disease Father    Diabetes Brother    Breast cancer Mother     Physical Exam: Vitals:   07/01/22 1845 07/01/22 1900 07/01/22 1938 07/01/22 2015  BP: (!) 128/56 (!) 130/50  (!) 121/51  Pulse: 94 91 98 98  Resp: '18 15 20 20  '$ Temp:      TempSrc:      SpO2: 100% 100% 100% 99%  Weight:      Height:        General:  Somnolent but arousable, extreme morbid obesity, on BiPAP Eyes: PERRLA, pink conjunctiva, no scleral icterus ENT: Dry oral mucosa, neck supple, no thyromegaly Lungs: clear to ascultation, no wheeze, no crackles, no use of accessory muscles Cardiovascular: regular rate and rhythm, no regurgitation, no gallops, no murmurs. No carotid bruits, no JVD Abdomen: soft, positive BS, non-tender, non-distended, no organomegaly, not an acute abdomen GU: not examined Neuro: CN II - XII difficult to assess d/t patient's somnolence Musculoskeletal:  Difficult to assess d/t patient's somnolence Skin: Dry skin B/L foot to ankle, stage I decubitus, Psych: Somnolent patient   Labs on Admission:  Recent Labs    07/01/22 1711  NA 142  K 4.4  CL 101  CO2 28  GLUCOSE 153*  BUN 20  CREATININE 2.30*  CALCIUM 8.2*   Recent Labs    07/01/22 1711  AST 16  ALT 11  ALKPHOS 100  BILITOT 0.7  PROT 7.1  ALBUMIN 3.3*    Recent Labs    07/01/22 1711  WBC 10.6*  NEUTROABS 7.0  HGB 12.4  HCT 41.7  MCV 99.0  PLT 390    Micro Results: Recent Results (from the past 240 hour(s))  Resp panel by RT-PCR (RSV, Flu A&B, Covid) Anterior Nasal Swab     Status: None   Collection Time: 07/01/22  5:22 PM   Specimen: Anterior Nasal Swab  Result Value Ref Range Status   SARS Coronavirus 2 by RT PCR NEGATIVE NEGATIVE Final    Comment: (NOTE) SARS-CoV-2 target nucleic acids are NOT DETECTED.  The SARS-CoV-2 RNA is generally detectable in upper respiratory specimens during the acute phase of infection. The lowest concentration of SARS-CoV-2 viral copies this assay can detect is 138 copies/mL. A negative result does not preclude SARS-Cov-2 infection and should not be used as the sole basis for treatment or other patient management decisions. A negative result may occur with  improper specimen collection/handling, submission of specimen other than nasopharyngeal swab, presence of viral mutation(s) within the areas targeted by this assay, and inadequate number of viral copies(<138 copies/mL). A negative result must be combined with clinical observations, patient history, and epidemiological information. The expected result is Negative.  Fact Sheet for Patients:  EntrepreneurPulse.com.au  Fact Sheet for Healthcare Providers:  IncredibleEmployment.be  This test is no t yet approved or cleared by the [place] FDA and  has been authorized for detection and/or diagnosis of SARS-CoV-2 by FDA under an  Emergency Use Authorization (EUA). This EUA will remain  in effect (meaning this test can be used) for the duration of the COVID-19 declaration under Section 564(b)(1) of the Act, 21 U.S.C.section 360bbb-3(b)(1), unless the authorization is terminated  or revoked sooner.       Influenza A by PCR NEGATIVE NEGATIVE Final   Influenza B by PCR NEGATIVE NEGATIVE Final    Comment: (NOTE) The Xpert Xpress SARS-CoV-2/FLU/RSV plus assay is intended as an aid in the diagnosis of influenza from Nasopharyngeal swab specimens and should not be used as a sole basis for treatment. Nasal washings and aspirates are unacceptable for Xpert Xpress SARS-CoV-2/FLU/RSV testing.  Fact Sheet for Patients: EntrepreneurPulse.com.au  Fact Sheet for Healthcare Providers: IncredibleEmployment.be  This test is not yet approved or cleared by the [place] FDA and has been authorized for detection and/or diagnosis of SARS-CoV-2 by FDA under an Emergency Use Authorization (EUA). This EUA will remain in effect (meaning this test can be used) for the duration of the COVID-19 declaration under Section 564(b)(1) of the Act, 21 U.S.C. section 360bbb-3(b)(1), unless the authorization is terminated or revoked.     Resp Syncytial Virus by PCR NEGATIVE NEGATIVE Final    Comment: (NOTE) Fact Sheet for Patients: EntrepreneurPulse.com.au  Fact Sheet for Healthcare Providers: IncredibleEmployment.be  This test is not yet approved or cleared by the [place] FDA and has been authorized for detection and/or diagnosis of SARS-CoV-2 by FDA under an Emergency Use Authorization (EUA). This EUA will remain in effect (meaning this test can be used) for the duration of the COVID-19 declaration under Section 564(b)(1) of the Act, 21 U.S.C. section 360bbb-3(b)(1), unless the authorization is terminated or revoked.  Performed at [facility], [place] [address] [place], [place] [address]      Radiological Exams on Admission: CT Chest Wo Contrast  Result Date: 07/01/2022 CLINICAL DATA:  Altered mental status EXAM: CT CHEST WITHOUT CONTRAST TECHNIQUE: Multidetector CT imaging of the chest was performed following the standard protocol without IV contrast. RADIATION DOSE REDUCTION: This exam was performed  according to the departmental dose-optimization program which includes automated exposure control, adjustment of the mA and/or kV according to patient size and/or use of iterative reconstruction technique. COMPARISON:  Radiograph 07/01/2022 FINDINGS: Cardiovascular: Cardiomegaly. No pericardial effusion. Aortic valve and mitral annular calcification. Coronary artery and aortic atherosclerotic calcification. Mediastinum/Nodes: Shotty mediastinal and hilar lymph nodes are favored reactive. The largest is a AP window node measuring 8 mm (3/49). Unremarkable esophagus. Trachea is patent. Lungs/Pleura: Respiratory motion obscures detail. Expiratory phase scan. Bibasilar atelectasis or infiltrates. Nodular predominantly ground-glass opacities in the anterior right upper lobe and lingula. The largest measures 1.5 cm (3/55) in the anterior right upper lobe. No pneumothorax or pleural effusion. Mosaic attenuation compatible with air trapping. Upper Abdomen: No acute abnormality. Musculoskeletal: No acute fracture. IMPRESSION: 1. Constellation of findings suggestive of bronchitis/reactive airways. Predominantly ground-glass nodules measuring up to 1.5 cm are favored infectious/inflammatory. Non-contrast chest CT at 3-6 months is recommended after treatment. If nodules persist, subsequent management will be based upon the most suspicious nodule(s). This recommendation follows the consensus statement: Guidelines for Management of Incidental Pulmonary Nodules Detected on CT Images: From the Fleischner Society 2017; Radiology 2017; 284:228-243. 2.  Cardiomegaly and coronary artery calcification. Aortic Atherosclerosis (ICD10-I70.0). Electronically Signed   By: Placido Sou M.D.   On: 07/01/2022 19:31   CT HEAD WO CONTRAST  Result Date: 07/01/2022 CLINICAL DATA:  Altered mental status EXAM: CT HEAD WITHOUT CONTRAST TECHNIQUE: Contiguous axial images were obtained from the base of the skull through the vertex without intravenous contrast. RADIATION DOSE REDUCTION: This exam was performed according to the departmental dose-optimization program which includes automated exposure control, adjustment of the mA and/or kV according to patient size and/or use of iterative reconstruction technique. COMPARISON:  06/15/2022 FINDINGS: Brain: No evidence of acute infarction, hemorrhage, hydrocephalus, extra-axial collection or mass lesion/mass effect. Subcortical white matter and periventricular small vessel ischemic changes. Vascular: Intracranial atherosclerosis. Skull: Normal. Negative for fracture or focal lesion. Sinuses/Orbits: The visualized paranasal sinuses are essentially clear. The mastoid air cells are unopacified. Other: None. IMPRESSION: No evidence of acute intracranial abnormality. Small vessel ischemic changes. Electronically Signed   By: Julian Hy M.D.   On: 07/01/2022 19:25   DG Chest Port 1 View  Result Date: 07/01/2022 CLINICAL DATA:  Acute onset of altered mental status. EXAM: PORTABLE CHEST 1 VIEW COMPARISON:  Radiographs 06/15/2022 and 07/04/2016. FINDINGS: 1741 hours. Patient rotation to the left. Stable mild cardiomegaly and aortic atherosclerosis. There is mildly increased opacity peripherally at the left lung base which could reflect an infiltrate and/or small pleural effusion. The right lung appears clear. No evidence of pneumothorax. Unchanged small calcifications in the left paratracheal region. No acute osseous findings are evident. There are degenerative changes in the spine. Telemetry leads overlie the chest. IMPRESSION:  Mildly increased opacity peripherally at the left lung base which could reflect an infiltrate and/or small pleural effusion. Electronically Signed   By: Richardean Sale M.D.   On: 07/01/2022 17:54    Assessment/Plan Present on Admission:  Acute respiratory failure with hypoxia and hypercapnia (HCC)/HAP/Asthma -Pneumonia order set initiated -Cefepime and Flagyl initiated -Blood and urine cultures collected -Continue BiPAP  -VBG done in ER.  pH 7.3.  Will repeat ABG now -No wheeze appreciated..  Nebulizers ordered -Follow-up CT 3-6 months to ensure resolution of ground-glass nodules   Acute kidney injury superimposed on chronic kidney disease ([place]) -Gentle IV fluid hydration for 10 hours.  Careful, patient with CHF prior to admission -Hold lisinopril, Aldactone and spironolactone  Diabetes mellitus type 2 -Levemir resume at half doses patient NPO. -Sliding scale insulin every 4 hours   Essential hypertension -Blood pressure medications hydralazine, on hold -As needed blood pressure medications ordered   Gout -Stable, resume allopurinol, first dose in a.m. if off BiPAP   Solitary kidney -Avoid nephrotoxic medications  Addendum ABG done.  7.28/68/114.  Patient retaining CO2, FiO2 decreased to 28.  Repeat ABG improved 7.31/60/101.  BiPAP continued.  Patient CO2 retention likely worsened by patient OSA/OHS  Genea Rheaume 07/01/2022, 8:40 PM

## 2022-07-01 NOTE — ED Notes (Signed)
RT called for BIPAP

## 2022-07-01 NOTE — ED Triage Notes (Signed)
Patient presents from [facility] due to sudden, drastic decrease in mental throughout the day. She was last seen well last night. EMS reports hot to the touch.  EMS vitals: 130/50 BP 120 HR 40 RR 90% SPO2 on room air, 100% SPO2 on non re breather 8 L O2 ( also increase ion mentation)

## 2022-07-01 NOTE — ED Notes (Signed)
Pt placed on 3L [place]

## 2022-07-02 DIAGNOSIS — J9601 Acute respiratory failure with hypoxia: Secondary | ICD-10-CM | POA: Diagnosis not present

## 2022-07-02 LAB — BASIC METABOLIC PANEL
Anion gap: 13 (ref 5–15)
BUN: 25 mg/dL — ABNORMAL HIGH (ref 8–23)
CO2: 24 mmol/L (ref 22–32)
Calcium: 7.9 mg/dL — ABNORMAL LOW (ref 8.9–10.3)
Chloride: 101 mmol/L (ref 98–111)
Creatinine, Ser: 2.73 mg/dL — ABNORMAL HIGH (ref 0.44–1.00)
GFR, Estimated: 18 mL/min — ABNORMAL LOW (ref >60)
Glucose, Bld: 261 mg/dL — ABNORMAL HIGH (ref 70–99)
Potassium: 4.6 mmol/L (ref 3.5–5.1)
Sodium: 138 mmol/L (ref 135–145)

## 2022-07-02 LAB — BLOOD GAS, ARTERIAL
Acid-Base Excess: 2.7 mmol/L — ABNORMAL HIGH (ref 0.0–2.0)
Acid-Base Excess: 2 mmol/L (ref 0.0–2.0)
Bicarbonate: 31.7 mmol/L — ABNORMAL HIGH (ref 20.0–28.0)
Bicarbonate: 29.7 mmol/L — ABNORMAL HIGH (ref 20.0–28.0)
Delivery systems: POSITIVE
Delivery systems: POSITIVE
Drawn by: 54496
Drawn by: 54496
Expiratory PAP: 6 cmH2O
Expiratory PAP: 6 cmH2O
FIO2: 35 %
FIO2: 28 %
Inspiratory PAP: 22 cmH2O
Inspiratory PAP: 22 cmH2O
MECHVT: 327 mL
O2 Saturation: 99 %
O2 Saturation: 98.8 %
Patient temperature: 36.5
Patient temperature: 37.2
RATE: 20 resp/min
pCO2 arterial: 68 mmHg (ref 32–48)
pCO2 arterial: 60 mmHg — ABNORMAL HIGH (ref 32–48)
pH, Arterial: 7.28 — ABNORMAL LOW (ref 7.35–7.45)
pH, Arterial: 7.31 — ABNORMAL LOW (ref 7.35–7.45)
pO2, Arterial: 114 mmHg — ABNORMAL HIGH (ref 83–108)
pO2, Arterial: 101 mmHg (ref 83–108)

## 2022-07-02 LAB — CBC WITH DIFFERENTIAL/PLATELET
Abs Immature Granulocytes: 0.12 10*3/uL — ABNORMAL HIGH (ref 0.00–0.07)
Basophils Absolute: 0 10*3/uL (ref 0.0–0.1)
Basophils Relative: 0 %
Eosinophils Absolute: 0 10*3/uL (ref 0.0–0.5)
Eosinophils Relative: 0 %
HCT: 37.4 % (ref 36.0–46.0)
Hemoglobin: 10.9 g/dL — ABNORMAL LOW (ref 12.0–15.0)
Immature Granulocytes: 1 %
Lymphocytes Relative: 5 %
Lymphs Abs: 0.4 10*3/uL — ABNORMAL LOW (ref 0.7–4.0)
MCH: 29.1 pg (ref 26.0–34.0)
MCHC: 29.1 g/dL — ABNORMAL LOW (ref 30.0–36.0)
MCV: 100 fL (ref 80.0–100.0)
Monocytes Absolute: 0.1 10*3/uL (ref 0.1–1.0)
Monocytes Relative: 1 %
Neutro Abs: 8.7 10*3/uL — ABNORMAL HIGH (ref 1.7–7.7)
Neutrophils Relative %: 93 %
Platelets: 305 10*3/uL (ref 150–400)
RBC: 3.74 MIL/uL — ABNORMAL LOW (ref 3.87–5.11)
RDW: 15.4 % (ref 11.5–15.5)
WBC: 9.4 10*3/uL (ref 4.0–10.5)
nRBC: 0 % (ref 0.0–0.2)

## 2022-07-02 LAB — BRAIN NATRIURETIC PEPTIDE: B Natriuretic Peptide: 293 pg/mL — ABNORMAL HIGH (ref 0.0–100.0)

## 2022-07-02 LAB — GLUCOSE, CAPILLARY
Glucose-Capillary: 243 mg/dL — ABNORMAL HIGH (ref 70–99)
Glucose-Capillary: 278 mg/dL — ABNORMAL HIGH (ref 70–99)
Glucose-Capillary: 241 mg/dL — ABNORMAL HIGH (ref 70–99)
Glucose-Capillary: 218 mg/dL — ABNORMAL HIGH (ref 70–99)
Glucose-Capillary: 225 mg/dL — ABNORMAL HIGH (ref 70–99)
Glucose-Capillary: 222 mg/dL — ABNORMAL HIGH (ref 70–99)

## 2022-07-02 LAB — RESPIRATORY PANEL BY PCR

## 2022-07-02 LAB — CK: Total CK: 39 U/L (ref 38–234)

## 2022-07-02 LAB — SEDIMENTATION RATE: Sed Rate: 25 mm/hr — ABNORMAL HIGH (ref 0–22)

## 2022-07-02 LAB — PROCALCITONIN: Procalcitonin: <0.1 ng/mL

## 2022-07-02 LAB — C-REACTIVE PROTEIN: CRP: 1.9 mg/dL — ABNORMAL HIGH (ref <1.0)

## 2022-07-02 MED ORDER — METOPROLOL TARTRATE 5 MG/5ML IV SOLN: 5.0000 mg | INTRAVENOUS | Status: DC | PRN

## 2022-07-02 MED ORDER — ORAL CARE MOUTH RINSE
15.0000 mL | OROMUCOSAL | Status: DC
  Administered 2022-07-02 – 2022-07-08 (×25): 15 mL via OROMUCOSAL

## 2022-07-02 MED ORDER — ARFORMOTEROL TARTRATE 15 MCG/2ML IN NEBU
15.0000 ug | INHALATION_SOLUTION | Freq: Two times a day (BID) | RESPIRATORY_TRACT | Status: DC
  Administered 2022-07-02 – 2022-07-08 (×13): 15 ug via RESPIRATORY_TRACT
  Filled 2022-07-02 (×12): qty 2

## 2022-07-02 MED ORDER — SODIUM CHLORIDE 0.9 % IV SOLN: INTRAVENOUS | Status: DC

## 2022-07-02 MED ORDER — SENNOSIDES-DOCUSATE SODIUM 8.6-50 MG PO TABS: 1.0000 | ORAL_TABLET | Freq: Every evening | ORAL | Status: DC | PRN

## 2022-07-02 MED ORDER — METRONIDAZOLE 500 MG/100ML IV SOLN
500.0000 mg | Freq: Two times a day (BID) | INTRAVENOUS | Status: DC
  Administered 2022-07-02: 500 mg via INTRAVENOUS
  Filled 2022-07-02: qty 100

## 2022-07-02 MED ORDER — ONDANSETRON HCL 4 MG/2ML IJ SOLN
4.0000 mg | Freq: Four times a day (QID) | INTRAMUSCULAR | Status: DC | PRN
  Administered 2022-07-04: 4 mg via INTRAVENOUS
  Filled 2022-07-02: qty 2

## 2022-07-02 MED ORDER — LACTATED RINGERS IV SOLN: INTRAVENOUS | Status: AC

## 2022-07-02 MED ORDER — HYDRALAZINE HCL 20 MG/ML IJ SOLN: 10.0000 mg | INTRAMUSCULAR | Status: DC | PRN

## 2022-07-02 MED ORDER — GUAIFENESIN 100 MG/5ML PO LIQD
5.0000 mL | ORAL | Status: DC | PRN
  Administered 2022-07-05 – 2022-07-07 (×3): 5 mL via ORAL
  Filled 2022-07-02 (×3): qty 10

## 2022-07-02 MED ORDER — IPRATROPIUM-ALBUTEROL 0.5-2.5 (3) MG/3ML IN SOLN: 3.0000 mL | RESPIRATORY_TRACT | Status: DC | PRN

## 2022-07-02 MED ORDER — SODIUM CHLORIDE 0.9 % IV SOLN
2.0000 g | Freq: Two times a day (BID) | INTRAVENOUS | Status: DC
  Administered 2022-07-02: 2 g via INTRAVENOUS
  Filled 2022-07-02: qty 12.5

## 2022-07-02 MED ORDER — REVEFENACIN 175 MCG/3ML IN SOLN
175.0000 ug | Freq: Every day | RESPIRATORY_TRACT | Status: DC
  Administered 2022-07-02 – 2022-07-08 (×7): 175 ug via RESPIRATORY_TRACT
  Filled 2022-07-02 (×7): qty 3

## 2022-07-02 MED ORDER — SODIUM CHLORIDE 0.9 % IV SOLN: 2.0000 g | INTRAVENOUS | Status: DC

## 2022-07-02 MED ORDER — PREDNISONE 20 MG PO TABS
40.0000 mg | ORAL_TABLET | Freq: Every day | ORAL | Status: AC
  Administered 2022-07-03 – 2022-07-07 (×5): 40 mg via ORAL
  Filled 2022-07-02 (×5): qty 2

## 2022-07-02 MED ORDER — ORAL CARE MOUTH RINSE: 15.0000 mL | OROMUCOSAL | Status: DC | PRN

## 2022-07-02 NOTE — Progress Notes (Signed)
PHARMACY NOTE:  ANTIMICROBIAL RENAL DOSAGE ADJUSTMENT  Current antimicrobial regimen includes a mismatch between antimicrobial dosage and estimated renal function.  As per policy approved by the Pharmacy & Therapeutics and Medical Executive Committees, the antimicrobial dosage will be adjusted accordingly.  Current antimicrobial dosage:  cefepime 2 g IV q12h  Indication: pneumonia  Renal Function:  Estimated Creatinine Clearance: 27.6 mL/min (A) (by C-G formula based on SCr of 2.73 mg/dL (H)).     Antimicrobial dosage has been changed to:  cefepime 2 g IV q24h    Thank you for allowing pharmacy to be a part of this patient's care.  Tawnya Crook, PharmD, BCPS Clinical Pharmacist 07/02/2022 9:54 AM

## 2022-07-02 NOTE — Progress Notes (Signed)
Placed PT back on BiPAP for mild shortness of breath. PT did state that her breathing was "not a good." RN aware.

## 2022-07-02 NOTE — Progress Notes (Signed)
Found PT off BiPAP and on 3 LPM nasal cannula with Sp02 97% (decreased to 2 LPM w/ MD goal >=92%). PT is awake and is aware of self, time, place. PT does not appear to be in respiratory distress at this time. RN aware and agrees to titrate 02 appropriately for >=92% (PT is a CO2 reatiner).

## 2022-07-02 NOTE — Progress Notes (Signed)
RN notified RT at approximately 1730 that PT requesting to come off BiPAP. RN states that PT seems awake and ok. RT confirmed that PT will be placed on 2 LPM for Sp02 goal of 92%.

## 2022-07-02 NOTE — Progress Notes (Signed)
Pharmacy Antibiotic Note  Jody Simpson is a 73 y.o. female admitted on 07/01/2022 with pneumonia.  Recent hospitalization.  Pharmacy has been consulted for Cefepime dosing.  Plan: Cefepime 2gm IV q12h Metronidazole '500mg'$  IV q12h Follow renal function F/u culture results and sensitivities  Height: '5\' 6"'$  (167.6 cm) Weight: (!) 139.4 kg (307 lb 4.8 oz) IBW/kg (Calculated) : 59.3  Temp (24hrs), Avg:98.4 F (36.9 C), Min:97.7 F (36.5 C), Max:98.9 F (37.2 C)  Recent Labs  Lab 07/01/22 1711  WBC 10.6*  CREATININE 2.30*  LATICACIDVEN 1.2    Estimated Creatinine Clearance: 32.8 mL/min (A) (by C-G formula based on SCr of 2.3 mg/dL (H)).    Allergies  Allergen Reactions   Aspirin     REACTION: stomach irritation   Banana    Cholestyramine     REACTION: Diarrhea and nausea   Codeine     REACTION: stomach irritant   Diazepam     REACTION: hyper   Hydrocodone    Ibuprofen Other (See Comments)   Iodine    Latex    Meperidine Hcl    Metformin     REACTION: Diarrhea   Naproxen Sodium     REACTION: stomach irritant   Penicillins    Pravastatin Sodium     REACTION: Flu like symptoms   Sitagliptin Phosphate     REACTION: Caused elevated CBGs   Tape Rash    Tegaderm transparent plastic film    Antimicrobials this admission: 1/30 Ceftriaxone x 1 1/30 Azithromycin x 1 1/31 Cefepime >> 1/31 Metronidazole >>  Dose adjustments this admission:    Microbiology results: 1/30 BCx:      Thank you for allowing pharmacy to be a part of this patient's care.  Everette Rank, PharmD 07/02/2022 2:32 AM

## 2022-07-02 NOTE — Progress Notes (Signed)
PT demonstrated hands on understanding of Flutter device. NPC at this time.

## 2022-07-02 NOTE — Plan of Care (Signed)
  Problem: Coping: Goal: Ability to adjust to condition or change in health will improve Outcome: Progressing   Problem: Metabolic: Goal: Ability to maintain appropriate glucose levels will improve Outcome: Progressing   Problem: Skin Integrity: Goal: Risk for impaired skin integrity will decrease Outcome: Progressing   Problem: Tissue Perfusion: Goal: Adequacy of tissue perfusion will improve Outcome: Progressing

## 2022-07-02 NOTE — Progress Notes (Signed)
PT remains off BiPAP and on 2 LPM nasal cannula (Sp02 94%), RR 17, BBS diminished. PT continues to be aware of self, time and place. PT states she is breathing "good". PT does not appear to be in respiratory distress at this time.

## 2022-07-02 NOTE — TOC Initial Note (Signed)
Transition of Care [facility]) - Initial/Assessment Note    Patient Details  Name: Jody Simpson MRN: [id number] Date of Birth: 1950/11/07  Transition of Care [facility]) CM/SW Contact:    Leeroy Cha, RN Phone Number: 07/02/2022, 7:41 AM  Clinical Narrative:                 Per thre note patient is a resident at [facility] independent [facility].  Was admitted into the snf last week. Will follow to see if she needs snf versus ilf.  Expected Discharge Plan: Home/Self Care Barriers to Discharge: Continued Medical Work up   Patient Goals and CMS Choice Patient states their goals for this hospitalization and ongoing recovery are:: return to snf CMS Medicare.gov Compare Post Acute Care list provided to:: Patient Choice offered to / list presented to : Patient      Expected Discharge Plan and Services   Discharge Planning Services: CM Consult Post Acute Care Choice: [place] Living arrangements for the past 2 months: Apartment                                      Prior Living Arrangements/Services Living arrangements for the past 2 months: Apartment Lives with:: Self Patient language and need for interpreter reviewed:: Yes Do you feel safe going back to the place where you live?: Yes            Criminal Activity/Legal Involvement Pertinent to Current Situation/Hospitalization: No - Comment as needed  Activities of Daily Living      Permission Sought/Granted                  Emotional Assessment Appearance:: Appears stated age Attitude/Demeanor/Rapport: Engaged Affect (typically observed): Calm Orientation: : Oriented to Self, Oriented to Place, Oriented to  Time, Oriented to Situation Alcohol / Substance Use: Never Used Psych Involvement: No (comment)  Admission diagnosis:  Acute respiratory failure ([place]) [J96.00] AKI (acute kidney injury) ([place]) [N17.9] Acute respiratory failure with hypoxia and hypercapnia ([place]) [J96.01, J96.02] Altered  mental status, unspecified altered mental status type [R41.82] Patient Active Problem List   Diagnosis Date Noted   Acute respiratory failure with hypoxia ([place]) 07/01/2022   Acute kidney injury superimposed on chronic kidney disease ([place]) 07/01/2022   Asthma, chronic 07/01/2022   Acute respiratory failure ([place]) 07/01/2022   Sepsis secondary to UTI ([place]) 06/15/2022   CAP (community acquired pneumonia) 07/03/2016   Obesity-BMI 51 07/03/2016   Fall-down at home x 48 hrs 07/03/2016   PAT (paroxysmal atrial tachycardia) 07/03/2016   COLONIC POLYPS 09/08/2007   Gout 09/08/2007   GASTROESOPHAGEAL REFLUX DISEASE 09/08/2007   IRRITABLE BOWEL SYNDROME 09/08/2007   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[place]) 07/28/2006   Hyperlipemia 07/28/2006   SOLITARY KIDNEY 07/28/2006   Diabetes mellitus, type 2 ([place]) 07/21/2006   Essential hypertension 07/21/2006   DERMATITIS, ATOPIC 07/21/2006   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   PCP:  Jinny Blossom, [facility] Pharmacy:   [facility] [place], [place] [address] [place] [address] Phone: [phone] Fax: [place], [place] AT [facility] [address] [place] [address] Phone: [phone] Fax: [place], [address] [place] [address] Phone: [phone] Fax: [phone]     Social Determinants of Health (SDOH) Social History: [place]: No Food Insecurity (06/15/2022)  Housing: Low Risk  (06/15/2022)  Transportation Needs: No Transportation Needs (06/15/2022)  Utilities: Not At Risk (06/15/2022)  Tobacco Use: Low Risk  (07/01/2022)   SDOH Interventions:     Readmission Risk Interventions   Row Labels 07/02/2022    7:37 AM  Readmission Risk Prevention  Plan   Section Header. No data exists in this row.   Transportation Screening   Complete  PCP or Specialist Appt within 5-7 Days   Complete  Home Care Screening   Complete  Medication Review (RN CM)   Complete

## 2022-07-02 NOTE — Progress Notes (Signed)
PROGRESS NOTE    Jody Simpson  WIO:[id number] DOB: [birth year] DOA: 07/01/2022 PCP: Jinny Blossom, [facility]   Brief Narrative:  72 year old female with past medical history of extreme morbid obesity, SVT, essential hypertension, hyperlipidemia, GERD, type 2 diabetes.  Patient recently admitted 1/14 to 1/25 with sepsis secondary to E. coli UTI, influenza, heart failure diastolic and physical deconditioning.  Patient declined skilled nursing return to independent living center.  In the ED patient was found to be septic secondary to possible community-acquired pneumonia.   Assessment & Plan:  Principal Problem:   Acute respiratory failure with hypoxia (HCC) Active Problems:   Diabetes mellitus, type 2 (HCC)   Gout   Essential hypertension   SOLITARY KIDNEY   Acute kidney injury superimposed on chronic kidney disease (HCC)   Asthma, chronic   Acute respiratory failure (HCC)     Acute respiratory failure with hypoxia and hypercapnia (HCC)/HAP/Asthma Groundglass nodule up to 1.5 cm -Concerns of bronchitis. Follow culture data.  Bronchodilators scheduled and as needed. -Follow-up CT in 3-6 months. Check resp panel -BNP slightly up, ProCal is neg. Start prednisone '40mg'$  po daily. Stop Abx.  -I-S/flutter valve   Acute kidney injury superimposed on chronic kidney disease (HCC) Solitary kidney -Baseline creatinine 0.95, admission creatinine 2.3, today is 2.73.  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  AV nodal's blockers discontinued.  Has follow-up outpatient appointment with EP.  Zio patch.  Congestive heart failure with preserved ejection fraction - Echocardiogram showing EF of 65%.  Needs outpatient follow-up with cardiology.   Diabetes mellitus type 2, insulin-dependent -Sliding scale and Accu-Chek.   Essential hypertension -Home lisinopril, Lasix and Aldactone on hold in the setting of AKI. - IV as  needed   Gout -Stable    DVT prophylaxis: SQ Heparin Code Status: Full Code Family Communication:    Status is: Inpatient Remains inpatient appropriate because: Still quite weak and feeling short of breath.     Body mass index is 49.6 kg/m.  Pressure Injury 06/15/22 Buttocks Right Stage 2 -  Partial thickness loss of dermis presenting as a shallow open injury with a red, pink wound bed without slough. (Active)  06/15/22 1427  Location: Buttocks  Location Orientation: Right  Staging: Stage 2 -  Partial thickness loss of dermis presenting as a shallow open injury with a red, pink wound bed without slough.  Wound Description (Comments):   Present on Admission: Yes        Subjective: Seen and examined at bedside.  Tells me she feels better on nasal cannula at this time and better compared to yesterday.   Examination:  General exam: Appears calm and comfortable, chronically ill-appearing Respiratory system: Diminished breath sounds bilaterally Cardiovascular system: S1 & S2 heard, RRR. No JVD, murmurs, rubs, gallops or clicks. No pedal edema. Gastrointestinal system: Abdomen is nondistended, soft and nontender. No organomegaly or masses felt. Normal bowel sounds heard. Central nervous system: Alert and oriented. No focal neurological deficits. Extremities: Symmetric 4 x 5 power. Skin: No rashes, lesions or ulcers Psychiatry: Judgement and insight appear normal. Mood & affect appropriate.     Objective: Vitals:   07/02/22 0200 07/02/22 0325 07/02/22 0406 07/02/22 0605  BP: (!) 107/46   (!) 125/53  Pulse: 88 90 96 97  Resp:  '20 20 18  '$ Temp: 97.7 F (36.5 C)   98.4 F (36.9 C)  TempSrc: Oral   Oral  SpO2: 100% 97%  97% 97%  Weight:      Height:       No intake or output data in the 24 hours ending 07/02/22 0850 Filed Weights   07/01/22 1746  Weight: (!) 139.4 kg     Data Reviewed:   CBC: Recent Labs  Lab 07/01/22 1711 07/02/22 0528  WBC 10.6* 9.4   NEUTROABS 7.0 8.7*  HGB 12.4 10.9*  HCT 41.7 37.4  MCV 99.0 100.0  PLT 390 366   Basic Metabolic Panel: Recent Labs  Lab 07/01/22 1711 07/02/22 0528  NA 142 138  K 4.4 4.6  CL 101 101  CO2 28 24  GLUCOSE 153* 261*  BUN 20 25*  CREATININE 2.30* 2.73*  CALCIUM 8.2* 7.9*   GFR: Estimated Creatinine Clearance: 27.6 mL/min (A) (by C-G formula based on SCr of 2.73 mg/dL (H)). Liver Function Tests: Recent Labs  Lab 07/01/22 1711  AST 16  ALT 11  ALKPHOS 100  BILITOT 0.7  PROT 7.1  ALBUMIN 3.3*   No results for input(s): "LIPASE", "AMYLASE" in the last 168 hours. Recent Labs  Lab 07/01/22 1732  AMMONIA 17   Coagulation Profile: No results for input(s): "INR", "PROTIME" in the last 168 hours. Cardiac Enzymes: Recent Labs  Lab 07/02/22 0528  CKTOTAL 39   BNP (last 3 results) No results for input(s): "PROBNP" in the last 8760 hours. HbA1C: No results for input(s): "HGBA1C" in the last 72 hours. CBG: Recent Labs  Lab 07/01/22 1710 07/01/22 2331 07/02/22 0109 07/02/22 0339 07/02/22 0735  GLUCAP 156* 201* 243* 278* 241*   Lipid Profile: No results for input(s): "CHOL", "HDL", "LDLCALC", "TRIG", "CHOLHDL", "LDLDIRECT" in the last 72 hours. Thyroid Function Tests: No results for input(s): "TSH", "T4TOTAL", "FREET4", "T3FREE", "THYROIDAB" in the last 72 hours. Anemia Panel: No results for input(s): "VITAMINB12", "FOLATE", "FERRITIN", "TIBC", "IRON", "RETICCTPCT" in the last 72 hours. Sepsis Labs: Recent Labs  Lab 07/01/22 1711  LATICACIDVEN 1.2    Recent Results (from the past 240 hour(s))  Culture, blood (Routine X 2) w Reflex to ID Panel     Status: None (Preliminary result)   Collection Time: 07/01/22  5:11 PM   Specimen: BLOOD RIGHT ARM  Result Value Ref Range Status   Specimen Description   Final    BLOOD RIGHT ARM Performed at [facility], [place] [address] [place], [place] [address]    Special Requests   Final    BOTTLES DRAWN AEROBIC  AND ANAEROBIC Blood Culture results may not be optimal due to an inadequate volume of blood received in culture bottles Performed at [facility] [address] [place], [place] [address]    Culture   Final    NO GROWTH < 12 HOURS Performed at [facility] [address] [place], [place] [address]    Report Status PENDING  Incomplete  Resp panel by RT-PCR (RSV, Flu A&B, Covid) Anterior Nasal Swab     Status: None   Collection Time: 07/01/22  5:22 PM   Specimen: Anterior Nasal Swab  Result Value Ref Range Status   SARS Coronavirus 2 by RT PCR NEGATIVE NEGATIVE Final    Comment: (NOTE) SARS-CoV-2 target nucleic acids are NOT DETECTED.  The SARS-CoV-2 RNA is generally detectable in upper respiratory specimens during the acute phase of infection. The lowest concentration of SARS-CoV-2 viral copies this assay can detect is 138 copies/mL. A negative result does not preclude SARS-Cov-2 infection and should not be used as the sole  basis for treatment or other patient management decisions. A negative result may occur with  improper specimen collection/handling, submission of specimen other than nasopharyngeal swab, presence of viral mutation(s) within the areas targeted by this assay, and inadequate number of viral copies(<138 copies/mL). A negative result must be combined with clinical observations, patient history, and epidemiological information. The expected result is Negative.  Fact Sheet for Patients:  EntrepreneurPulse.com.au  Fact Sheet for Healthcare Providers:  IncredibleEmployment.be  This test is no t yet approved or cleared by the [place] FDA and  has been authorized for detection and/or diagnosis of SARS-CoV-2 by FDA under an Emergency Use Authorization (EUA). This EUA will remain  in effect (meaning this test can be used) for the duration of the COVID-19 declaration under Section 564(b)(1) of the Act,  21 U.S.C.section 360bbb-3(b)(1), unless the authorization is terminated  or revoked sooner.       Influenza A by PCR NEGATIVE NEGATIVE Final   Influenza B by PCR NEGATIVE NEGATIVE Final    Comment: (NOTE) The Xpert Xpress SARS-CoV-2/FLU/RSV plus assay is intended as an aid in the diagnosis of influenza from Nasopharyngeal swab specimens and should not be used as a sole basis for treatment. Nasal washings and aspirates are unacceptable for Xpert Xpress SARS-CoV-2/FLU/RSV testing.  Fact Sheet for Patients: EntrepreneurPulse.com.au  Fact Sheet for Healthcare Providers: IncredibleEmployment.be  This test is not yet approved or cleared by the [place] FDA and has been authorized for detection and/or diagnosis of SARS-CoV-2 by FDA under an Emergency Use Authorization (EUA). This EUA will remain in effect (meaning this test can be used) for the duration of the COVID-19 declaration under Section 564(b)(1) of the Act, 21 U.S.C. section 360bbb-3(b)(1), unless the authorization is terminated or revoked.     Resp Syncytial Virus by PCR NEGATIVE NEGATIVE Final    Comment: (NOTE) Fact Sheet for Patients: EntrepreneurPulse.com.au  Fact Sheet for Healthcare Providers: IncredibleEmployment.be  This test is not yet approved or cleared by the [place] FDA and has been authorized for detection and/or diagnosis of SARS-CoV-2 by FDA under an Emergency Use Authorization (EUA). This EUA will remain in effect (meaning this test can be used) for the duration of the COVID-19 declaration under Section 564(b)(1) of the Act, 21 U.S.C. section 360bbb-3(b)(1), unless the authorization is terminated or revoked.  Performed at [facility], [place] [address], [place], [place] [address]   Culture, blood (Routine X 2) w Reflex to ID Panel     Status: None (Preliminary result)   Collection Time: 07/01/22  7:10  PM   Specimen: BLOOD  Result Value Ref Range Status   Specimen Description   Final    BLOOD SITE NOT SPECIFIED Performed at [facility] [address], [place], [place] [address]    Special Requests   Final    BOTTLES DRAWN AEROBIC AND ANAEROBIC Blood Culture adequate volume Performed at [facility] [address] [place], [place] [address]    Culture   Final    NO GROWTH < 12 HOURS Performed at [facility] [address] [place], [place] [address]    Report Status PENDING  Incomplete         Radiology Studies: CT Chest Wo Contrast  Result Date: 07/01/2022 CLINICAL DATA:  Altered mental status EXAM: CT CHEST WITHOUT CONTRAST TECHNIQUE: Multidetector CT imaging of the chest was performed following the standard protocol without IV contrast. RADIATION DOSE REDUCTION: This exam was performed according to  the departmental dose-optimization program which includes automated exposure control, adjustment of the mA and/or kV according to patient size and/or use of iterative reconstruction technique. COMPARISON:  Radiograph 07/01/2022 FINDINGS: Cardiovascular: Cardiomegaly. No pericardial effusion. Aortic valve and mitral annular calcification. Coronary artery and aortic atherosclerotic calcification. Mediastinum/Nodes: Shotty mediastinal and hilar lymph nodes are favored reactive. The largest is a AP window node measuring 8 mm (3/49). Unremarkable esophagus. Trachea is patent. Lungs/Pleura: Respiratory motion obscures detail. Expiratory phase scan. Bibasilar atelectasis or infiltrates. Nodular predominantly ground-glass opacities in the anterior right upper lobe and lingula. The largest measures 1.5 cm (3/55) in the anterior right upper lobe. No pneumothorax or pleural effusion. Mosaic attenuation compatible with air trapping. Upper Abdomen: No acute abnormality. Musculoskeletal: No acute fracture. IMPRESSION: 1. Constellation of findings suggestive  of bronchitis/reactive airways. Predominantly ground-glass nodules measuring up to 1.5 cm are favored infectious/inflammatory. Non-contrast chest CT at 3-6 months is recommended after treatment. If nodules persist, subsequent management will be based upon the most suspicious nodule(s). This recommendation follows the consensus statement: Guidelines for Management of Incidental Pulmonary Nodules Detected on CT Images: From the Fleischner Society 2017; Radiology 2017; 284:228-243. 2. Cardiomegaly and coronary artery calcification. Aortic Atherosclerosis (ICD10-I70.0). Electronically Signed   By: Placido Sou M.D.   On: 07/01/2022 19:31   CT HEAD WO CONTRAST  Result Date: 07/01/2022 CLINICAL DATA:  Altered mental status EXAM: CT HEAD WITHOUT CONTRAST TECHNIQUE: Contiguous axial images were obtained from the base of the skull through the vertex without intravenous contrast. RADIATION DOSE REDUCTION: This exam was performed according to the departmental dose-optimization program which includes automated exposure control, adjustment of the mA and/or kV according to patient size and/or use of iterative reconstruction technique. COMPARISON:  06/15/2022 FINDINGS: Brain: No evidence of acute infarction, hemorrhage, hydrocephalus, extra-axial collection or mass lesion/mass effect. Subcortical white matter and periventricular small vessel ischemic changes. Vascular: Intracranial atherosclerosis. Skull: Normal. Negative for fracture or focal lesion. Sinuses/Orbits: The visualized paranasal sinuses are essentially clear. The mastoid air cells are unopacified. Other: None. IMPRESSION: No evidence of acute intracranial abnormality. Small vessel ischemic changes. Electronically Signed   By: Julian Hy M.D.   On: 07/01/2022 19:25   DG Chest Port 1 View  Result Date: 07/01/2022 CLINICAL DATA:  Acute onset of altered mental status. EXAM: PORTABLE CHEST 1 VIEW COMPARISON:  Radiographs 06/15/2022 and 07/04/2016.  FINDINGS: 1741 hours. Patient rotation to the left. Stable mild cardiomegaly and aortic atherosclerosis. There is mildly increased opacity peripherally at the left lung base which could reflect an infiltrate and/or small pleural effusion. The right lung appears clear. No evidence of pneumothorax. Unchanged small calcifications in the left paratracheal region. No acute osseous findings are evident. There are degenerative changes in the spine. Telemetry leads overlie the chest. IMPRESSION: Mildly increased opacity peripherally at the left lung base which could reflect an infiltrate and/or small pleural effusion. Electronically Signed   By: Richardean Sale M.D.   On: 07/01/2022 17:54     Scheduled Meds:  allopurinol  300 mg Oral Daily   heparin  5,000 Units Subcutaneous Q8H   hydrALAZINE  100 mg Oral Q8H   insulin aspart  0-15 Units Subcutaneous Q4H   insulin detemir  12 Units Subcutaneous QHS   mouth rinse  15 mL Mouth Rinse 4 times per day   Continuous Infusions:  ceFEPime (MAXIPIME) IV 2 g (07/02/22 0401)   metronidazole 500 mg (07/02/22 0353)     LOS: 1 day   Time spent= 35 mins  Dineen Conradt Arsenio Loader, MD [facility]  If 7PM-7AM, please contact night-coverage  07/02/2022, 8:50 AM

## 2022-07-02 NOTE — TOC Progression Note (Signed)
Transition of Care [facility]) - Progression Note    Patient Details  Name: Jody Simpson MRN: [id number] Date of Birth: 11-18-1950  [facility] CM/SW Contact  Leeroy Cha, RN Phone Number: 07/02/2022, 10:27 AM  Clinical Narrative:    [facility] is snf patient [place] will need new auth for [employer].   Expected Discharge Plan: Home/Self Care Barriers to Discharge: Continued Medical Work up  Expected Discharge Plan and Services   Discharge Planning Services: CM Consult Post Acute Care Choice: [place] arrangements for the past 2 months: Apartment                                       Social Determinants of Health (SDOH) Interventions SDOH Screenings   Food Insecurity: No Food Insecurity (06/15/2022)  Housing: Low Risk  (06/15/2022)  Transportation Needs: No Transportation Needs (06/15/2022)  Utilities: Not At Risk (06/15/2022)  Tobacco Use: Low Risk  (07/01/2022)    Readmission Risk Interventions   Row Labels 07/02/2022    7:37 AM  Readmission Risk Prevention Plan   Section Header. No data exists in this row.   Transportation Screening   Complete  PCP or Specialist Appt within 5-7 Days   Complete  Home Care Screening   Complete  Medication Review (RN CM)   Complete

## 2022-07-03 DIAGNOSIS — J9601 Acute respiratory failure with hypoxia: Secondary | ICD-10-CM | POA: Diagnosis not present

## 2022-07-03 LAB — BASIC METABOLIC PANEL
Anion gap: 9 (ref 5–15)
BUN: 32 mg/dL — ABNORMAL HIGH (ref 8–23)
CO2: 28 mmol/L (ref 22–32)
Calcium: 8.2 mg/dL — ABNORMAL LOW (ref 8.9–10.3)
Chloride: 103 mmol/L (ref 98–111)
Creatinine, Ser: 1.92 mg/dL — ABNORMAL HIGH (ref 0.44–1.00)
GFR, Estimated: 28 mL/min — ABNORMAL LOW (ref >60)
Glucose, Bld: 110 mg/dL — ABNORMAL HIGH (ref 70–99)
Potassium: 4.1 mmol/L (ref 3.5–5.1)
Sodium: 140 mmol/L (ref 135–145)

## 2022-07-03 LAB — MAGNESIUM: Magnesium: 2.2 mg/dL (ref 1.7–2.4)

## 2022-07-03 LAB — BLOOD GAS, VENOUS
Acid-Base Excess: 6 mmol/L — ABNORMAL HIGH (ref 0.0–2.0)
Bicarbonate: 33 mmol/L — ABNORMAL HIGH (ref 20.0–28.0)
O2 Saturation: 71.8 %
Patient temperature: 36.7
pCO2, Ven: 56 mmHg (ref 44–60)
pH, Ven: 7.37 (ref 7.25–7.43)
pO2, Ven: 41 mmHg (ref 32–45)

## 2022-07-03 LAB — GLUCOSE, CAPILLARY
Glucose-Capillary: 120 mg/dL — ABNORMAL HIGH (ref 70–99)
Glucose-Capillary: 131 mg/dL — ABNORMAL HIGH (ref 70–99)
Glucose-Capillary: 171 mg/dL — ABNORMAL HIGH (ref 70–99)
Glucose-Capillary: 185 mg/dL — ABNORMAL HIGH (ref 70–99)
Glucose-Capillary: 185 mg/dL — ABNORMAL HIGH (ref 70–99)
Glucose-Capillary: 190 mg/dL — ABNORMAL HIGH (ref 70–99)
Glucose-Capillary: 96 mg/dL (ref 70–99)

## 2022-07-03 LAB — CBC
HCT: 34.8 % — ABNORMAL LOW (ref 36.0–46.0)
Hemoglobin: 10.3 g/dL — ABNORMAL LOW (ref 12.0–15.0)
MCH: 29.1 pg (ref 26.0–34.0)
MCHC: 29.6 g/dL — ABNORMAL LOW (ref 30.0–36.0)
MCV: 98.3 fL (ref 80.0–100.0)
Platelets: 278 10*3/uL (ref 150–400)
RBC: 3.54 MIL/uL — ABNORMAL LOW (ref 3.87–5.11)
RDW: 14.6 % (ref 11.5–15.5)
WBC: 9.7 10*3/uL (ref 4.0–10.5)
nRBC: 0 % (ref 0.0–0.2)

## 2022-07-03 LAB — AMMONIA: Ammonia: <10 umol/L (ref 9–35)

## 2022-07-03 MED ORDER — SODIUM CHLORIDE 0.9 % IV SOLN: INTRAVENOUS | Status: AC

## 2022-07-03 NOTE — Evaluation (Signed)
Occupational Therapy Evaluation Patient Details Name: Jody Simpson MRN: [id number] DOB: 10/08/1950 Today's Date: 07/03/2022   History of Present Illness 72 year old female with past medical history of extreme morbid obesity, SVT, essential hypertension, hyperlipidemia, GERD, type 2 diabetes.  Patient recently admitted 1/14 to 1/25 with sepsis secondary to E. coli UTI, influenza, heart failure diastolic and physical deconditioning.  Patient declined skilled nursing return to independent living center.  Denies she was brought in by EMS, confused.   Dx of respiratory failure, AKI.   Clinical Impression   Ms. Jody Simpson is a 72 year old woman who prior to 1/14 was living in [facility] and was modified independent with rollator. She had a decline in functional abilities during last admission and was discharged to rehab on 1/25.  Unsure of how she performed at rehab but on 1/23 with OTA patient was min guard to get to EOB and was setup for grooming and UB dressing at edge of bed and max assist for LB ADLs and stand with stedy on 1/25. Today patient is lethargic, confused, generally weak with poor tolerance. She is unable to lift her arms without assistance and unable to feed herself. She is able to grossly swipe at the lower part of her face. She is otherwise dependent for self care at bed level. She is barely able to move her legs and was total assist with +2 assist to just roll. On prior admission she was mod I with bed rails to perform bed mobility. Patient will benefit from skilled OT services while in hospital to improve deficits and learn compensatory strategies as needed in order to return to PLOF.  Patient needs to return to rehab to improve functional abilities.      Recommendations for follow up therapy are one component of a multi-disciplinary discharge planning process, led by the attending physician.  Recommendations may be updated based on patient status, additional  functional criteria and insurance authorization.   Follow Up Recommendations  Skilled nursing-short term rehab (<3 hours/day)     Assistance Recommended at Discharge Frequent or constant Supervision/Assistance  Patient can return home with the following Two people to help with walking and/or transfers;Two people to help with bathing/dressing/bathroom;Assistance with cooking/housework;Assistance with feeding;Direct supervision/assist for medications management;Direct supervision/assist for financial management;Help with stairs or ramp for entrance;Assist for transportation    Functional Status Assessment  Patient has had a recent decline in their functional status and demonstrates the ability to make significant improvements in function in a reasonable and predictable amount of time.  Equipment Recommendations  None recommended by OT    Recommendations for Other Services       Precautions / Restrictions Precautions Precautions: Fall Precaution Comments: monitor sats Restrictions Weight Bearing Restrictions: No      Mobility Bed Mobility Overal bed mobility: Needs Assistance Bed Mobility: Rolling Rolling: Total assist, +2 for physical assistance         General bed mobility comments: +2 total to roll for linen change    Transfers                          Balance                                           ADL either performed or assessed with clinical judgement   ADL Overall ADL's : Needs  assistance/impaired Eating/Feeding: Total assistance;Bed level   Grooming: Maximal assistance;Bed level Grooming Details (indicate cue type and reason): able to wash bottom part of her face but unable to reach above mouth Upper Body Bathing: Total assistance;Bed level   Lower Body Bathing: Total assistance;Bed level   Upper Body Dressing : Total assistance;Bed level   Lower Body Dressing: Total assistance;Bed level   Toilet Transfer: Total  assistance Toilet Transfer Details (indicate cue type and reason): would require hoyer Toileting- Clothing Manipulation and Hygiene: Total assistance;Bed level Toileting - Clothing Manipulation Details (indicate cue type and reason): found to be incontinent and required +2 physical assist for pericare and bed change     Functional mobility during ADLs: Total assistance;+2 for physical assistance       Vision   Vision Assessment?: No apparent visual deficits     Perception     Praxis      Pertinent Vitals/Pain Pain Assessment Pain Assessment: Faces Faces Pain Scale: No hurt     Hand Dominance Right   Extremity/Trunk Assessment Upper Extremity Assessment Upper Extremity Assessment: Generalized weakness;LUE deficits/detail;RUE deficits/detail RUE Deficits / Details: Needed PROM for shoulders, partial elbow flexion and gross wrist and finger ROM. Strength 3/5 RUE Coordination: decreased gross motor;decreased fine motor LUE Deficits / Details: Needed PROM for shoulders, partial elbow flexion and gross wrist and finger ROM. Strength 3/5 LUE Coordination: decreased gross motor;decreased fine motor   Lower Extremity Assessment Lower Extremity Assessment: Defer to PT evaluation RLE Deficits / Details: hip -2/5, knee 3/5, ankle -4/5 RLE Sensation: WNL LLE Deficits / Details: hip -2/5, knee 3/5, ankle -4/5 LLE Sensation: WNL   Cervical / Trunk Assessment Cervical / Trunk Assessment: Normal   Communication Communication Communication: No difficulties   Cognition Arousal/Alertness: Lethargic Behavior During Therapy: Flat affect Overall Cognitive Status: Impaired/Different from baseline                                 General Comments: ALert to self, knows she is in the hospital, stated year was 3033, that Shon Millet was the President, said month was April. Very flat affect compared to prior evaluation.     General Comments       Exercises     Shoulder  Instructions      Home Living Family/patient expects to be discharged to:: Skilled nursing facility Living Arrangements: Alone           Home Layout: One level     Bathroom Shower/Tub: Tub/shower unit   Bathroom Toilet: Handicapped height     Home Equipment: Rollator (4 wheels);Tub bench;Adaptive equipment Adaptive Equipment: Reacher Additional Comments: lift chair      Prior Functioning/Environment Prior Level of Function : Independent/Modified Independent;Patient poor historian/Family not available             Mobility Comments: above info is per prior PT eval 06/16/22, pt presently confused and not able to provide hx. Was mod I with rollatory prior to 1/14. Unsure what she was able to do at SNF ADLs Comments: was mod I prior to 1/14. Unsure of recent abilities at SNF.        OT Problem List: Decreased strength;Decreased activity tolerance;Cardiopulmonary status limiting activity;Obesity;Impaired balance (sitting and/or standing);Decreased range of motion;Decreased cognition;Decreased safety awareness;Decreased coordination;Impaired UE functional use;Increased edema      OT Treatment/Interventions: Self-care/ADL training;Therapeutic exercise;DME and/or AE instruction;Therapeutic activities;Balance training;Patient/family education    OT Goals(Current goals can be found in  the care plan section) Acute Rehab OT Goals OT Goal Formulation: Patient unable to participate in goal setting Time For Goal Achievement: 07/17/22 Potential to Achieve Goals: Fair  OT Frequency: Min 2X/week    Co-evaluation              AM-PAC OT "6 Clicks" Daily Activity     Outcome Measure Help from another person eating meals?: Total Help from another person taking care of personal grooming?: A Lot Help from another person toileting, which includes using toliet, bedpan, or urinal?: Total Help from another person bathing (including washing, rinsing, drying)?: Total Help from another  person to put on and taking off regular upper body clothing?: Total Help from another person to put on and taking off regular lower body clothing?: Total 6 Click Score: 7   End of Session Nurse Communication: Mobility status  Activity Tolerance: Patient limited by fatigue;Patient limited by lethargy Patient left: in bed;with call bell/phone within reach;with bed alarm set  OT Visit Diagnosis: Muscle weakness (generalized) (M62.81)                Time: 2355-7322 OT Time Calculation (min): 23 min Charges:  OT General Charges $OT Visit: 1 Visit OT Evaluation $OT Eval Low Complexity: 1 Low OT Treatments $Self Care/Home Management : 8-22 mins  Gustavo Lah, OTR/L [place]  Office [phone]   Lenward Chancellor 07/03/2022, 4:19 PM

## 2022-07-03 NOTE — Evaluation (Signed)
Physical Therapy Evaluation Patient Details Name: Jody Simpson MRN: [id number] DOB: [birth year] Today's Date: 07/03/2022  History of Present Illness  72 year old female with past medical history of extreme morbid obesity, SVT, essential hypertension, hyperlipidemia, GERD, type 2 diabetes.  Patient recently admitted 1/14 to 1/25 with sepsis secondary to E. coli UTI, influenza, heart failure diastolic and physical deconditioning.  Patient declined skilled nursing return to independent living center.  Denies she was brought in by EMS, confused.   Dx of respiratory failure, AKI.  Clinical Impression  Pt admitted with above diagnosis. Pt with very flat affect, responds to questions but was only able to state month and day of her birthdate, stated year of birth was "2011". Pt was able to follow 1 step commands. +2 total assist to roll for linen change. Pt performed supine BUE/LE AAROM. She is profoundly weak and deconditioned, SNF recommended.  Pt currently with functional limitations due to the deficits listed below (see PT Problem List). Pt will benefit from skilled PT to increase their independence and safety with mobility to allow discharge to the venue listed below.          Recommendations for follow up therapy are one component of a multi-disciplinary discharge planning process, led by the attending physician.  Recommendations may be updated based on patient status, additional functional criteria and insurance authorization.  Follow Up Recommendations Skilled nursing-short term rehab (<3 hours/day) Can patient physically be transported by private vehicle: No    Assistance Recommended at Discharge Frequent or constant Supervision/Assistance  Patient can return home with the following  Two people to help with walking and/or transfers;Two people to help with bathing/dressing/bathroom;Assist for transportation;Help with stairs or ramp for entrance    Equipment Recommendations None recommended by  PT  Recommendations for Other Services       Functional Status Assessment Patient has had a recent decline in their functional status and demonstrates the ability to make significant improvements in function in a reasonable and predictable amount of time.     Precautions / Restrictions Precautions Precautions: Fall Precaution Comments: monitor sats Restrictions Weight Bearing Restrictions: No      Mobility  Bed Mobility Overal bed mobility: Needs Assistance Bed Mobility: Rolling Rolling: Total assist, +2 for physical assistance         General bed mobility comments: +2 total to roll for linen change    Transfers                        Ambulation/Gait                  Stairs            Wheelchair Mobility    Modified Rankin (Stroke Patients Only)       Balance                                             Pertinent Vitals/Pain Pain Assessment Pain Score: 0-No pain Faces Pain Scale: No hurt    Home Living Family/patient expects to be discharged to:: Skilled nursing facility Living Arrangements: Alone             Home Layout: One level Home Equipment: Rollator (4 wheels);Tub bench;Adaptive equipment Additional Comments: lift chair    Prior Function Prior Level of Function : Independent/Modified Independent;Patient poor historian/Family not available  Mobility Comments: above info is per prior PT eval 06/16/22, pt presently confused and not able to provide hx       Hand Dominance   Dominant Hand: Right    Extremity/Trunk Assessment   Upper Extremity Assessment Upper Extremity Assessment: Defer to OT evaluation    Lower Extremity Assessment Lower Extremity Assessment: RLE deficits/detail;LLE deficits/detail RLE Deficits / Details: hip -2/5, knee 3/5, ankle -4/5 RLE Sensation: WNL LLE Deficits / Details: hip -2/5, knee 3/5, ankle -4/5 LLE Sensation: WNL       Communication    Communication: No difficulties  Cognition Arousal/Alertness: Lethargic Behavior During Therapy: WFL for tasks assessed/performed Overall Cognitive Status: Impaired/Different from baseline                                 General Comments: able to state birth month and day but not year, knows she's in the hospital, can state name of president but just prior to this session she told OT that Shon Millet is the current president        General Comments      Exercises General Exercises - Lower Extremity Ankle Circles/Pumps: AROM, Both, 20 reps Short Arc Quad: AROM, Both, 10 reps, Supine Heel Slides: AAROM, Both, 10 reps, Supine Hip ABduction/ADduction: AAROM, Both, 10 reps, Supine   Assessment/Plan    PT Assessment Patient needs continued PT services  PT Problem List Decreased strength;Decreased range of motion;Decreased activity tolerance;Decreased balance;Decreased mobility;Decreased coordination;Decreased knowledge of precautions;Obesity;Pain       PT Treatment Interventions DME instruction;Functional mobility training;Therapeutic activities;Therapeutic exercise;Balance training;Neuromuscular re-education;Patient/family education    PT Goals (Current goals can be found in the Care Plan section)  Acute Rehab PT Goals PT Goal Formulation: Patient unable to participate in goal setting Time For Goal Achievement: 07/17/22 Potential to Achieve Goals: Fair    Frequency Min 2X/week     Co-evaluation               AM-PAC PT "6 Clicks" Mobility  Outcome Measure Help needed turning from your back to your side while in a flat bed without using bedrails?: Total Help needed moving from lying on your back to sitting on the side of a flat bed without using bedrails?: Total Help needed moving to and from a bed to a chair (including a wheelchair)?: Total Help needed standing up from a chair using your arms (e.g., wheelchair or bedside chair)?: Total Help needed to walk  in hospital room?: Total   6 Click Score: 5    End of Session Equipment Utilized During Treatment: Gait belt Activity Tolerance: Patient limited by fatigue Patient left: in bed;with bed alarm set;with call bell/phone within reach Nurse Communication: Mobility status;Need for lift equipment PT Visit Diagnosis: Muscle weakness (generalized) (M62.81);Other abnormalities of gait and mobility (R26.89);Difficulty in walking, not elsewhere classified (R26.2)    Time: 1157-2620 PT Time Calculation (min) (ACUTE ONLY): 9 min   Charges:   PT Evaluation $PT Eval Moderate Complexity: 1 Mod          Philomena Doheny PT 07/03/2022  Acute Rehabilitation Services  Office [phone]

## 2022-07-03 NOTE — Progress Notes (Signed)
PROGRESS NOTE    Jody Simpson  YTK:[id number] DOB: Jan 06, 1951 DOA: 07/01/2022 PCP: Jinny Blossom, [facility]   Brief Narrative:  72 year old female with past medical history of extreme morbid obesity, SVT, essential hypertension, hyperlipidemia, GERD, type 2 diabetes.  Patient recently admitted 1/14 to 1/25 with sepsis secondary to E. coli UTI, influenza, heart failure diastolic and physical deconditioning.  Patient declined skilled nursing return to independent living center.  In the ED patient initially thought to be septic secondary to community-acquired pneumonia but procalcitonin was negative therefore antibiotics were discontinued.  Patient was continued on prednisone and bronchodilators.  Also found to be in acute kidney injury requiring IV fluids.   Assessment & Plan:  Principal Problem:   Acute respiratory failure with hypoxia (HCC) Active Problems:   Diabetes mellitus, type 2 (HCC)   Gout   Essential hypertension   SOLITARY KIDNEY   Acute kidney injury superimposed on chronic kidney disease (HCC)   Asthma, chronic   Acute respiratory failure (HCC)     Acute respiratory failure with hypoxia and hypercapnia (HCC)/HAP/Asthma Groundglass nodule up to 1.5 cm -Concerns of bronchitis. Follow culture data.  Bronchodilators scheduled and as needed. -Follow-up CT in 3-6 months. Check resp panel -BNP slightly up, ProCal is neg. Start prednisone '40mg'$  po daily. Stop Abx.  -I-S/flutter valve   Acute kidney injury superimposed on chronic kidney disease (HCC) Solitary kidney -Baseline creatinine 0.95, admission creatinine 2.3, today is 2.73> 1.92.  IV fluids, hold off on nephrotoxic drugs  Drowsy - I suspect from fatigue.  VBG appears unremarkable.  Will follow-up ammonia levels.  2:1 high-grade AV conduction defect - Recently seen by cardiology.  AV nodal's blockers discontinued.  Has follow-up outpatient appointment with EP.  Zio patch.  Congestive heart failure with  preserved ejection fraction - Echocardiogram showing EF of 65%.  Needs outpatient follow-up with cardiology.   Diabetes mellitus type 2, insulin-dependent -Sliding scale and Accu-Chek.   Essential hypertension -Home lisinopril, Lasix and Aldactone on hold in the setting of AKI. - IV as needed   Gout -Stable   PT/OT  DVT prophylaxis: SQ Heparin Code Status: Full Code Family Communication:    Status is: Inpatient Remains inpatient appropriate because: Still quite weak and has acute kidney injury.  Maintain hospital stay   Body mass index is 49.6 kg/m.  Pressure Injury 06/15/22 Buttocks Right Stage 2 -  Partial thickness loss of dermis presenting as a shallow open injury with a red, pink wound bed without slough. (Active)  06/15/22 1427  Location: Buttocks  Location Orientation: Right  Staging: Stage 2 -  Partial thickness loss of dermis presenting as a shallow open injury with a red, pink wound bed without slough.  Wound Description (Comments):   Present on Admission: Yes        Subjective: Seen and examined at bedside.  Patient wakes up and answers appropriately but overall little drowsy.  She did not get much rest last night.  We were able to wean down on her oxygen Examination: Constitutional: Not in acute distress, appears chronically ill. Respiratory: Diminished breath sounds bilaterally Cardiovascular: Normal sinus rhythm, no rubs Abdomen: Nontender nondistended good bowel sounds Musculoskeletal: No edema noted Skin: No rashes seen Neurologic: CN 2-12 grossly intact.  And nonfocal Psychiatric: Normal judgment and insight. Alert and oriented x 3. Normal mood.   Objective: Vitals:   07/02/22 1525 07/02/22 2003 07/03/22 0412 07/03/22 0802  BP:  (!) 118/44 138/63   Pulse: 88 (!) 101 94  Resp: (!) '24 18 18   '$ Temp:  98 F (36.7 C) 98 F (36.7 C)   TempSrc:  Oral Oral   SpO2: 95% 99% 99% 99%  Weight:      Height:        Intake/Output Summary (Last 24  hours) at 07/03/2022 0854 Last data filed at 07/03/2022 0300 Gross per 24 hour  Intake 325.6 ml  Output 650 ml  Net -324.4 ml   Filed Weights   07/01/22 1746  Weight: (!) 139.4 kg     Data Reviewed:   CBC: Recent Labs  Lab 07/01/22 1711 07/02/22 0528 07/03/22 0514  WBC 10.6* 9.4 9.7  NEUTROABS 7.0 8.7*  --   HGB 12.4 10.9* 10.3*  HCT 41.7 37.4 34.8*  MCV 99.0 100.0 98.3  PLT 390 305 875   Basic Metabolic Panel: Recent Labs  Lab 07/01/22 1711 07/02/22 0528 07/03/22 0514  NA 142 138 140  K 4.4 4.6 4.1  CL 101 101 103  CO2 '28 24 28  '$ GLUCOSE 153* 261* 110*  BUN 20 25* 32*  CREATININE 2.30* 2.73* 1.92*  CALCIUM 8.2* 7.9* 8.2*  MG  --   --  2.2   GFR: Estimated Creatinine Clearance: 39.3 mL/min (A) (by C-G formula based on SCr of 1.92 mg/dL (H)). Liver Function Tests: Recent Labs  Lab 07/01/22 1711  AST 16  ALT 11  ALKPHOS 100  BILITOT 0.7  PROT 7.1  ALBUMIN 3.3*   No results for input(s): "LIPASE", "AMYLASE" in the last 168 hours. Recent Labs  Lab 07/01/22 1732  AMMONIA 17   Coagulation Profile: No results for input(s): "INR", "PROTIME" in the last 168 hours. Cardiac Enzymes: Recent Labs  Lab 07/02/22 0528  CKTOTAL 39   BNP (last 3 results) No results for input(s): "PROBNP" in the last 8760 hours. HbA1C: No results for input(s): "HGBA1C" in the last 72 hours. CBG: Recent Labs  Lab 07/02/22 1657 07/02/22 1959 07/03/22 0015 07/03/22 0407 07/03/22 0737  GLUCAP 225* 222* 131* 96 120*   Lipid Profile: No results for input(s): "CHOL", "HDL", "LDLCALC", "TRIG", "CHOLHDL", "LDLDIRECT" in the last 72 hours. Thyroid Function Tests: No results for input(s): "TSH", "T4TOTAL", "FREET4", "T3FREE", "THYROIDAB" in the last 72 hours. Anemia Panel: No results for input(s): "VITAMINB12", "FOLATE", "FERRITIN", "TIBC", "IRON", "RETICCTPCT" in the last 72 hours. Sepsis Labs: Recent Labs  Lab 07/01/22 1711 07/02/22 0528  PROCALCITON  --  <0.10   LATICACIDVEN 1.2  --     Recent Results (from the past 240 hour(s))  Culture, blood (Routine X 2) w Reflex to ID Panel     Status: None (Preliminary result)   Collection Time: 07/01/22  5:11 PM   Specimen: BLOOD RIGHT ARM  Result Value Ref Range Status   Specimen Description   Final    BLOOD RIGHT ARM Performed at [facility], [place] [address] [place], [place] [address]    Special Requests   Final    BOTTLES DRAWN AEROBIC AND ANAEROBIC Blood Culture results may not be optimal due to an inadequate volume of blood received in culture bottles Performed at [place] [address] [place], [place] [address]    Culture   Final    NO GROWTH 2 DAYS Performed at [place] [address], [place], [place] [address]    Report Status PENDING  Incomplete  Resp panel by RT-PCR (RSV, Flu A&B, Covid) Anterior Nasal Swab     Status: None  Collection Time: 07/01/22  5:22 PM   Specimen: Anterior Nasal Swab  Result Value Ref Range Status   SARS Coronavirus 2 by RT PCR NEGATIVE NEGATIVE Final    Comment: (NOTE) SARS-CoV-2 target nucleic acids are NOT DETECTED.  The SARS-CoV-2 RNA is generally detectable in upper respiratory specimens during the acute phase of infection. The lowest concentration of SARS-CoV-2 viral copies this assay can detect is 138 copies/mL. A negative result does not preclude SARS-Cov-2 infection and should not be used as the sole basis for treatment or other patient management decisions. A negative result may occur with  improper specimen collection/handling, submission of specimen other than nasopharyngeal swab, presence of viral mutation(s) within the areas targeted by this assay, and inadequate number of viral copies(<138 copies/mL). A negative result must be combined with clinical observations, patient history, and epidemiological information. The expected result is Negative.  Fact Sheet for Patients:   EntrepreneurPulse.com.au  Fact Sheet for Healthcare Providers:  IncredibleEmployment.be  This test is no t yet approved or cleared by the [place] FDA and  has been authorized for detection and/or diagnosis of SARS-CoV-2 by FDA under an Emergency Use Authorization (EUA). This EUA will remain  in effect (meaning this test can be used) for the duration of the COVID-19 declaration under Section 564(b)(1) of the Act, 21 U.S.C.section 360bbb-3(b)(1), unless the authorization is terminated  or revoked sooner.       Influenza A by PCR NEGATIVE NEGATIVE Final   Influenza B by PCR NEGATIVE NEGATIVE Final    Comment: (NOTE) The Xpert Xpress SARS-CoV-2/FLU/RSV plus assay is intended as an aid in the diagnosis of influenza from Nasopharyngeal swab specimens and should not be used as a sole basis for treatment. Nasal washings and aspirates are unacceptable for Xpert Xpress SARS-CoV-2/FLU/RSV testing.  Fact Sheet for Patients: EntrepreneurPulse.com.au  Fact Sheet for Healthcare Providers: IncredibleEmployment.be  This test is not yet approved or cleared by the [place] FDA and has been authorized for detection and/or diagnosis of SARS-CoV-2 by FDA under an Emergency Use Authorization (EUA). This EUA will remain in effect (meaning this test can be used) for the duration of the COVID-19 declaration under Section 564(b)(1) of the Act, 21 U.S.C. section 360bbb-3(b)(1), unless the authorization is terminated or revoked.     Resp Syncytial Virus by PCR NEGATIVE NEGATIVE Final    Comment: (NOTE) Fact Sheet for Patients: EntrepreneurPulse.com.au  Fact Sheet for Healthcare Providers: IncredibleEmployment.be  This test is not yet approved or cleared by the [place] FDA and has been authorized for detection and/or diagnosis of SARS-CoV-2 by FDA under an Emergency Use  Authorization (EUA). This EUA will remain in effect (meaning this test can be used) for the duration of the COVID-19 declaration under Section 564(b)(1) of the Act, 21 U.S.C. section 360bbb-3(b)(1), unless the authorization is terminated or revoked.  Performed at [facility], [place] [address] [place], [place] [address]   Culture, blood (Routine X 2) w Reflex to ID Panel     Status: None (Preliminary result)   Collection Time: 07/01/22  7:10 PM   Specimen: BLOOD  Result Value Ref Range Status   Specimen Description   Final    BLOOD SITE NOT SPECIFIED Performed at [place] [address] [place], [place] [address]    Special Requests   Final    BOTTLES DRAWN AEROBIC AND ANAEROBIC Blood Culture adequate volume Performed at [place] [address], [place], [place] [address]  Culture   Final    NO GROWTH 2 DAYS Performed at [facility], [place] [address], [place], [place] [address]    Report Status PENDING  Incomplete  Respiratory (~20 pathogens) panel by PCR     Status: None   Collection Time: 07/02/22 10:41 AM   Specimen: Nasopharyngeal Swab; Respiratory  Result Value Ref Range Status   Adenovirus NOT DETECTED NOT DETECTED Final   Coronavirus 229E NOT DETECTED NOT DETECTED Final    Comment: (NOTE) The Coronavirus on the Respiratory Panel, DOES NOT test for the novel  Coronavirus (2019 nCoV)    Coronavirus HKU1 NOT DETECTED NOT DETECTED Final   Coronavirus NL63 NOT DETECTED NOT DETECTED Final   Coronavirus OC43 NOT DETECTED NOT DETECTED Final   Metapneumovirus NOT DETECTED NOT DETECTED Final   Rhinovirus / Enterovirus NOT DETECTED NOT DETECTED Final   Influenza A NOT DETECTED NOT DETECTED Final   Influenza B NOT DETECTED NOT DETECTED Final   Parainfluenza Virus 1 NOT DETECTED NOT DETECTED Final   Parainfluenza Virus 2 NOT DETECTED NOT DETECTED Final   Parainfluenza Virus 3 NOT DETECTED NOT DETECTED  Final   Parainfluenza Virus 4 NOT DETECTED NOT DETECTED Final   Respiratory Syncytial Virus NOT DETECTED NOT DETECTED Final   Bordetella pertussis NOT DETECTED NOT DETECTED Final   Bordetella Parapertussis NOT DETECTED NOT DETECTED Final   Chlamydophila pneumoniae NOT DETECTED NOT DETECTED Final   Mycoplasma pneumoniae NOT DETECTED NOT DETECTED Final    Comment: Performed at [facility], [place]. [address], [place], [place] [address]         Radiology Studies: CT Chest Wo Contrast  Result Date: 07/01/2022 CLINICAL DATA:  Altered mental status EXAM: CT CHEST WITHOUT CONTRAST TECHNIQUE: Multidetector CT imaging of the chest was performed following the standard protocol without IV contrast. RADIATION DOSE REDUCTION: This exam was performed according to the departmental dose-optimization program which includes automated exposure control, adjustment of the mA and/or kV according to patient size and/or use of iterative reconstruction technique. COMPARISON:  Radiograph 07/01/2022 FINDINGS: Cardiovascular: Cardiomegaly. No pericardial effusion. Aortic valve and mitral annular calcification. Coronary artery and aortic atherosclerotic calcification. Mediastinum/Nodes: Shotty mediastinal and hilar lymph nodes are favored reactive. The largest is a AP window node measuring 8 mm (3/49). Unremarkable esophagus. Trachea is patent. Lungs/Pleura: Respiratory motion obscures detail. Expiratory phase scan. Bibasilar atelectasis or infiltrates. Nodular predominantly ground-glass opacities in the anterior right upper lobe and lingula. The largest measures 1.5 cm (3/55) in the anterior right upper lobe. No pneumothorax or pleural effusion. Mosaic attenuation compatible with air trapping. Upper Abdomen: No acute abnormality. Musculoskeletal: No acute fracture. IMPRESSION: 1. Constellation of findings suggestive of bronchitis/reactive airways. Predominantly ground-glass nodules measuring up to 1.5 cm are favored  infectious/inflammatory. Non-contrast chest CT at 3-6 months is recommended after treatment. If nodules persist, subsequent management will be based upon the most suspicious nodule(s). This recommendation follows the consensus statement: Guidelines for Management of Incidental Pulmonary Nodules Detected on CT Images: From the Fleischner Society 2017; Radiology 2017; 284:228-243. 2. Cardiomegaly and coronary artery calcification. Aortic Atherosclerosis (ICD10-I70.0). Electronically Signed   By: Placido Sou M.D.   On: 07/01/2022 19:31   CT HEAD WO CONTRAST  Result Date: 07/01/2022 CLINICAL DATA:  Altered mental status EXAM: CT HEAD WITHOUT CONTRAST TECHNIQUE: Contiguous axial images were obtained from the base of the skull through the vertex without intravenous contrast. RADIATION DOSE REDUCTION: This exam was performed according to the departmental dose-optimization program which includes automated  exposure control, adjustment of the mA and/or kV according to patient size and/or use of iterative reconstruction technique. COMPARISON:  06/15/2022 FINDINGS: Brain: No evidence of acute infarction, hemorrhage, hydrocephalus, extra-axial collection or mass lesion/mass effect. Subcortical white matter and periventricular small vessel ischemic changes. Vascular: Intracranial atherosclerosis. Skull: Normal. Negative for fracture or focal lesion. Sinuses/Orbits: The visualized paranasal sinuses are essentially clear. The mastoid air cells are unopacified. Other: None. IMPRESSION: No evidence of acute intracranial abnormality. Small vessel ischemic changes. Electronically Signed   By: Julian Hy M.D.   On: 07/01/2022 19:25   DG Chest Port 1 View  Result Date: 07/01/2022 CLINICAL DATA:  Acute onset of altered mental status. EXAM: PORTABLE CHEST 1 VIEW COMPARISON:  Radiographs 06/15/2022 and 07/04/2016. FINDINGS: 1741 hours. Patient rotation to the left. Stable mild cardiomegaly and aortic atherosclerosis.  There is mildly increased opacity peripherally at the left lung base which could reflect an infiltrate and/or small pleural effusion. The right lung appears clear. No evidence of pneumothorax. Unchanged small calcifications in the left paratracheal region. No acute osseous findings are evident. There are degenerative changes in the spine. Telemetry leads overlie the chest. IMPRESSION: Mildly increased opacity peripherally at the left lung base which could reflect an infiltrate and/or small pleural effusion. Electronically Signed   By: Richardean Sale M.D.   On: 07/01/2022 17:54     Scheduled Meds:  allopurinol  300 mg Oral Daily   arformoterol  15 mcg Nebulization BID   heparin  5,000 Units Subcutaneous Q8H   hydrALAZINE  100 mg Oral Q8H   insulin aspart  0-15 Units Subcutaneous Q4H   insulin detemir  12 Units Subcutaneous QHS   mouth rinse  15 mL Mouth Rinse 4 times per day   predniSONE  40 mg Oral Q breakfast   revefenacin  175 mcg Nebulization Daily   Continuous Infusions:  sodium chloride 75 mL/hr at 07/03/22 0402     LOS: 2 days   Time spent= 35 mins  Reighlyn Elmes Arsenio Loader, MD [facility]  If 7PM-7AM, please contact night-coverage  07/03/2022, 8:54 AM

## 2022-07-04 ENCOUNTER — Inpatient Hospital Stay (HOSPITAL_COMMUNITY): Payer: Medicare HMO

## 2022-07-04 DIAGNOSIS — J9601 Acute respiratory failure with hypoxia: Secondary | ICD-10-CM | POA: Diagnosis not present

## 2022-07-04 LAB — CBC
HCT: 35.8 % — ABNORMAL LOW (ref 36.0–46.0)
Hemoglobin: 10.8 g/dL — ABNORMAL LOW (ref 12.0–15.0)
MCH: 29 pg (ref 26.0–34.0)
MCHC: 30.2 g/dL (ref 30.0–36.0)
MCV: 96.2 fL (ref 80.0–100.0)
Platelets: 261 10*3/uL (ref 150–400)
RBC: 3.72 MIL/uL — ABNORMAL LOW (ref 3.87–5.11)
RDW: 14.6 % (ref 11.5–15.5)
WBC: 9.6 10*3/uL (ref 4.0–10.5)
nRBC: 0 % (ref 0.0–0.2)

## 2022-07-04 LAB — GLUCOSE, CAPILLARY
Glucose-Capillary: 116 mg/dL — ABNORMAL HIGH (ref 70–99)
Glucose-Capillary: 140 mg/dL — ABNORMAL HIGH (ref 70–99)
Glucose-Capillary: 173 mg/dL — ABNORMAL HIGH (ref 70–99)
Glucose-Capillary: 181 mg/dL — ABNORMAL HIGH (ref 70–99)
Glucose-Capillary: 210 mg/dL — ABNORMAL HIGH (ref 70–99)
Glucose-Capillary: 98 mg/dL (ref 70–99)

## 2022-07-04 LAB — BASIC METABOLIC PANEL
Anion gap: 9 (ref 5–15)
BUN: 33 mg/dL — ABNORMAL HIGH (ref 8–23)
CO2: 26 mmol/L (ref 22–32)
Calcium: 8.6 mg/dL — ABNORMAL LOW (ref 8.9–10.3)
Chloride: 107 mmol/L (ref 98–111)
Creatinine, Ser: 1.2 mg/dL — ABNORMAL HIGH (ref 0.44–1.00)
GFR, Estimated: 49 mL/min — ABNORMAL LOW (ref >60)
Glucose, Bld: 126 mg/dL — ABNORMAL HIGH (ref 70–99)
Potassium: 4.3 mmol/L (ref 3.5–5.1)
Sodium: 142 mmol/L (ref 135–145)

## 2022-07-04 LAB — MAGNESIUM: Magnesium: 2.5 mg/dL — ABNORMAL HIGH (ref 1.7–2.4)

## 2022-07-04 MED ORDER — POLYETHYLENE GLYCOL 3350 17 G PO PACK
17.0000 g | PACK | Freq: Every day | ORAL | Status: DC
  Administered 2022-07-04 – 2022-07-08 (×5): 17 g via ORAL
  Filled 2022-07-04 (×5): qty 1

## 2022-07-04 MED ORDER — DOCUSATE SODIUM 100 MG PO CAPS
100.0000 mg | ORAL_CAPSULE | Freq: Two times a day (BID) | ORAL | Status: DC
  Administered 2022-07-04 – 2022-07-08 (×9): 100 mg via ORAL
  Filled 2022-07-04 (×9): qty 1

## 2022-07-04 MED ORDER — ALPRAZOLAM 0.5 MG PO TABS
0.5000 mg | ORAL_TABLET | Freq: Two times a day (BID) | ORAL | Status: DC | PRN
  Administered 2022-07-05 – 2022-07-07 (×5): 0.5 mg via ORAL
  Filled 2022-07-04 (×5): qty 1

## 2022-07-04 MED ORDER — AMLODIPINE BESYLATE 5 MG PO TABS
5.0000 mg | ORAL_TABLET | Freq: Every day | ORAL | Status: DC
  Administered 2022-07-04 – 2022-07-08 (×5): 5 mg via ORAL
  Filled 2022-07-04 (×5): qty 1

## 2022-07-04 NOTE — Progress Notes (Signed)
PT BiPAP was removed at 0358

## 2022-07-04 NOTE — Progress Notes (Signed)
Pt was yelling, "help me!" When I came in and asked what was wrong Pt stated that her stomach hurt and she felt sick to her stomach. Pt stated she wanted the food out of the room because it was making her stomah hurt worse. RN is notified.

## 2022-07-04 NOTE — Progress Notes (Signed)
Mobility Specialist - Progress Note   07/04/22 1507  Mobility  Activity  (BLE)  Level of Assistance Contact guard assist, steadying assist  Activity Response Tolerated fair  $Mobility charge 1 Mobility   Pt was found in bed and agreeable to BLE. Pt was able to perform the exercises listed below. Required more assistance with her L side. Towards EOS pt became fatigued and stated being tired. Was left with all necessities in reach.  Exercises Performed: Shoulder Taps x 20  Cross Shoulder Taps x 10  10sec Straight Arm Holds x 2 Stress Ball Grips x [place] Mobility Specialist

## 2022-07-04 NOTE — Progress Notes (Addendum)
PROGRESS NOTE    Jody Simpson  JAS:[id number] DOB: 1950/11/07 DOA: 07/01/2022 PCP: Jinny Blossom, [facility]   Brief Narrative:  72 year old female with past medical history of extreme morbid obesity, SVT, essential hypertension, hyperlipidemia, GERD, type 2 diabetes.  Patient recently admitted 1/14 to 1/25 with sepsis secondary to E. coli UTI, influenza, heart failure diastolic and physical deconditioning.  Patient declined skilled nursing return to independent living center.  In the ED patient initially thought to be septic secondary to community-acquired pneumonia but procalcitonin was negative therefore antibiotics were discontinued.  Patient was continued on prednisone and bronchodilators.  Also found to be in acute kidney injury requiring IV fluids.   Assessment & Plan:  Principal Problem:   Acute respiratory failure with hypoxia (HCC) Active Problems:   Diabetes mellitus, type 2 (HCC)   Gout   Essential hypertension   SOLITARY KIDNEY   Acute kidney injury superimposed on chronic kidney disease (HCC)   Asthma, chronic   Acute respiratory failure (HCC)     Acute respiratory failure with hypoxia and hypercapnia (HCC)/HAP/Asthma Groundglass nodule up to 1.5 cm - Concerns of bronchitis, cultures remain negative.  Slowly continues to improve from breathing standpoint.  Respiratory panel negative -Follow-up CT in 3-6 months.  -BNP slightly up, ProCal is neg. continue prednisone 40 mg daily.   Acute kidney injury superimposed on chronic kidney disease (HCC) Solitary kidney -Baseline creatinine 0.95, admission creatinine 2.3, today is 2.73> 1.92>1.2.  IV fluids, hold off on nephrotoxic drugs  Drowsy - Resolved  2:1 high-grade AV conduction defect - Recently seen by cardiology.  AV nodal's blockers discontinued.  Has follow-up outpatient appointment with EP.  Zio patch.  Congestive heart failure with preserved ejection fraction - Echocardiogram showing EF of 65%.   Needs outpatient follow-up with cardiology.   Diabetes mellitus type 2, insulin-dependent -Sliding scale and Accu-Chek.   Essential hypertension, uncontrolled - Start Norvasc.  Home lisinopril, Lasix and Aldactone on hold in the setting of AKI. - IV as needed   Gout -Stable   PT/OT-SNF  DVT prophylaxis: SQ Heparin Code Status: Full Code Family Communication:    Status is: Inpatient Remains inpatient appropriate because: Slowly continues to improve, if renal function continues to improve and her breathing stays stable over next 24 hours, we can start transitioning her to SNF.  Body mass index is 49.6 kg/m.  Pressure Injury 06/15/22 Buttocks Right Stage 2 -  Partial thickness loss of dermis presenting as a shallow open injury with a red, pink wound bed without slough. (Active)  06/15/22 1427  Location: Buttocks  Location Orientation: Right  Staging: Stage 2 -  Partial thickness loss of dermis presenting as a shallow open injury with a red, pink wound bed without slough.  Wound Description (Comments):   Present on Admission: Yes        Subjective: Seen and examined at bedside.  Reporting of some abdominal discomfort.  Tells me after having a bowel movement she feels like she needs to go more.  Her last bowel movement was this morning and she was able to pass gas as well.  Denies any shortness of breath, nausea and vomiting. Examination: Constitutional: Not in acute distress but is chronically ill.  Morbid obesity Respiratory: Some bilateral diminished breath sounds Cardiovascular: Normal sinus rhythm, no rubs Abdomen: Nontender nondistended good bowel sounds Musculoskeletal: No edema noted Skin: No rashes seen Neurologic: CN 2-12 grossly intact.  And nonfocal Psychiatric: Normal judgment and insight. Alert and oriented x 3. Normal  mood.   Objective: Vitals:   07/04/22 0046 07/04/22 0411 07/04/22 0850 07/04/22 1235  BP:  (!) 172/76  (!) 163/73  Pulse: 95 88  92   Resp: 20 17  (!) 22  Temp:  (!) 97.5 F (36.4 C)  98.4 F (36.9 C)  TempSrc:  Oral  Oral  SpO2: 95% 93% 95% 94%  Weight:      Height:        Intake/Output Summary (Last 24 hours) at 07/04/2022 1306 Last data filed at 07/04/2022 1139 Gross per 24 hour  Intake 1401.25 ml  Output 700 ml  Net 701.25 ml   Filed Weights   07/01/22 1746  Weight: (!) 139.4 kg     Data Reviewed:   CBC: Recent Labs  Lab 07/01/22 1711 07/02/22 0528 07/03/22 0514 07/04/22 0420  WBC 10.6* 9.4 9.7 9.6  NEUTROABS 7.0 8.7*  --   --   HGB 12.4 10.9* 10.3* 10.8*  HCT 41.7 37.4 34.8* 35.8*  MCV 99.0 100.0 98.3 96.2  PLT 390 305 278 811   Basic Metabolic Panel: Recent Labs  Lab 07/01/22 1711 07/02/22 0528 07/03/22 0514 07/04/22 0420  NA 142 138 140 142  K 4.4 4.6 4.1 4.3  CL 101 101 103 107  CO2 '28 24 28 26  '$ GLUCOSE 153* 261* 110* 126*  BUN 20 25* 32* 33*  CREATININE 2.30* 2.73* 1.92* 1.20*  CALCIUM 8.2* 7.9* 8.2* 8.6*  MG  --   --  2.2 2.5*   GFR: Estimated Creatinine Clearance: 62.9 mL/min (A) (by C-G formula based on SCr of 1.2 mg/dL (H)). Liver Function Tests: Recent Labs  Lab 07/01/22 1711  AST 16  ALT 11  ALKPHOS 100  BILITOT 0.7  PROT 7.1  ALBUMIN 3.3*   No results for input(s): "LIPASE", "AMYLASE" in the last 168 hours. Recent Labs  Lab 07/01/22 1732 07/03/22 1149  AMMONIA 17 <10   Coagulation Profile: No results for input(s): "INR", "PROTIME" in the last 168 hours. Cardiac Enzymes: Recent Labs  Lab 07/02/22 0528  CKTOTAL 39   BNP (last 3 results) No results for input(s): "PROBNP" in the last 8760 hours. HbA1C: No results for input(s): "HGBA1C" in the last 72 hours. CBG: Recent Labs  Lab 07/03/22 2034 07/03/22 2359 07/04/22 0408 07/04/22 0812 07/04/22 1137  GLUCAP 185* 140* 116* 98 181*   Lipid Profile: No results for input(s): "CHOL", "HDL", "LDLCALC", "TRIG", "CHOLHDL", "LDLDIRECT" in the last 72 hours. Thyroid Function Tests: No results for  input(s): "TSH", "T4TOTAL", "FREET4", "T3FREE", "THYROIDAB" in the last 72 hours. Anemia Panel: No results for input(s): "VITAMINB12", "FOLATE", "FERRITIN", "TIBC", "IRON", "RETICCTPCT" in the last 72 hours. Sepsis Labs: Recent Labs  Lab 07/01/22 1711 07/02/22 0528  PROCALCITON  --  <0.10  LATICACIDVEN 1.2  --     Recent Results (from the past 240 hour(s))  Culture, blood (Routine X 2) w Reflex to ID Panel     Status: None (Preliminary result)   Collection Time: 07/01/22  5:11 PM   Specimen: BLOOD RIGHT ARM  Result Value Ref Range Status   Specimen Description   Final    BLOOD RIGHT ARM Performed at [facility], [place] [address], [place], [place] [address]    Special Requests   Final    BOTTLES DRAWN AEROBIC AND ANAEROBIC Blood Culture results may not be optimal due to an inadequate volume of blood received in culture bottles Performed at [place] [address], [place], [place] [address]  Culture   Final    NO GROWTH 3 DAYS Performed at [facility], [place] [address], [place], [place] [address]    Report Status PENDING  Incomplete  Resp panel by RT-PCR (RSV, Flu A&B, Covid) Anterior Nasal Swab     Status: None   Collection Time: 07/01/22  5:22 PM   Specimen: Anterior Nasal Swab  Result Value Ref Range Status   SARS Coronavirus 2 by RT PCR NEGATIVE NEGATIVE Final    Comment: (NOTE) SARS-CoV-2 target nucleic acids are NOT DETECTED.  The SARS-CoV-2 RNA is generally detectable in upper respiratory specimens during the acute phase of infection. The lowest concentration of SARS-CoV-2 viral copies this assay can detect is 138 copies/mL. A negative result does not preclude SARS-Cov-2 infection and should not be used as the sole basis for treatment or other patient management decisions. A negative result may occur with  improper specimen collection/handling, submission of specimen other than nasopharyngeal swab, presence of viral mutation(s)  within the areas targeted by this assay, and inadequate number of viral copies(<138 copies/mL). A negative result must be combined with clinical observations, patient history, and epidemiological information. The expected result is Negative.  Fact Sheet for Patients:  EntrepreneurPulse.com.au  Fact Sheet for Healthcare Providers:  IncredibleEmployment.be  This test is no t yet approved or cleared by the [place] FDA and  has been authorized for detection and/or diagnosis of SARS-CoV-2 by FDA under an Emergency Use Authorization (EUA). This EUA will remain  in effect (meaning this test can be used) for the duration of the COVID-19 declaration under Section 564(b)(1) of the Act, 21 U.S.C.section 360bbb-3(b)(1), unless the authorization is terminated  or revoked sooner.       Influenza A by PCR NEGATIVE NEGATIVE Final   Influenza B by PCR NEGATIVE NEGATIVE Final    Comment: (NOTE) The Xpert Xpress SARS-CoV-2/FLU/RSV plus assay is intended as an aid in the diagnosis of influenza from Nasopharyngeal swab specimens and should not be used as a sole basis for treatment. Nasal washings and aspirates are unacceptable for Xpert Xpress SARS-CoV-2/FLU/RSV testing.  Fact Sheet for Patients: EntrepreneurPulse.com.au  Fact Sheet for Healthcare Providers: IncredibleEmployment.be  This test is not yet approved or cleared by the [place] FDA and has been authorized for detection and/or diagnosis of SARS-CoV-2 by FDA under an Emergency Use Authorization (EUA). This EUA will remain in effect (meaning this test can be used) for the duration of the COVID-19 declaration under Section 564(b)(1) of the Act, 21 U.S.C. section 360bbb-3(b)(1), unless the authorization is terminated or revoked.     Resp Syncytial Virus by PCR NEGATIVE NEGATIVE Final    Comment: (NOTE) Fact Sheet for  Patients: EntrepreneurPulse.com.au  Fact Sheet for Healthcare Providers: IncredibleEmployment.be  This test is not yet approved or cleared by the [place] FDA and has been authorized for detection and/or diagnosis of SARS-CoV-2 by FDA under an Emergency Use Authorization (EUA). This EUA will remain in effect (meaning this test can be used) for the duration of the COVID-19 declaration under Section 564(b)(1) of the Act, 21 U.S.C. section 360bbb-3(b)(1), unless the authorization is terminated or revoked.  Performed at [facility], [place] [address], [place],  [address]   Culture, blood (Routine X 2) w Reflex to ID Panel     Status: None (Preliminary result)   Collection Time: 07/01/22  7:10 PM   Specimen: BLOOD  Result Value Ref Range Status   Specimen Description   Final  BLOOD SITE NOT SPECIFIED Performed at [facility], [place] [address] [place], [place] [address]    Special Requests   Final    BOTTLES DRAWN AEROBIC AND ANAEROBIC Blood Culture adequate volume Performed at [place] [address], [place], [place] [address]    Culture   Final    NO GROWTH 3 DAYS Performed at [facility], [place] [address] [place], [place] [address]    Report Status PENDING  Incomplete  Respiratory (~20 pathogens) panel by PCR     Status: None   Collection Time: 07/02/22 10:41 AM   Specimen: Nasopharyngeal Swab; Respiratory  Result Value Ref Range Status   Adenovirus NOT DETECTED NOT DETECTED Final   Coronavirus 229E NOT DETECTED NOT DETECTED Final    Comment: (NOTE) The Coronavirus on the Respiratory Panel, DOES NOT test for the novel  Coronavirus (2019 nCoV)    Coronavirus HKU1 NOT DETECTED NOT DETECTED Final   Coronavirus NL63 NOT DETECTED NOT DETECTED Final   Coronavirus OC43 NOT DETECTED NOT DETECTED Final   Metapneumovirus NOT DETECTED NOT DETECTED Final   Rhinovirus /  Enterovirus NOT DETECTED NOT DETECTED Final   Influenza A NOT DETECTED NOT DETECTED Final   Influenza B NOT DETECTED NOT DETECTED Final   Parainfluenza Virus 1 NOT DETECTED NOT DETECTED Final   Parainfluenza Virus 2 NOT DETECTED NOT DETECTED Final   Parainfluenza Virus 3 NOT DETECTED NOT DETECTED Final   Parainfluenza Virus 4 NOT DETECTED NOT DETECTED Final   Respiratory Syncytial Virus NOT DETECTED NOT DETECTED Final   Bordetella pertussis NOT DETECTED NOT DETECTED Final   Bordetella Parapertussis NOT DETECTED NOT DETECTED Final   Chlamydophila pneumoniae NOT DETECTED NOT DETECTED Final   Mycoplasma pneumoniae NOT DETECTED NOT DETECTED Final    Comment: Performed at [facility], [place]. [address], [place], [place] [address]         Radiology Studies: DG Abd 1 View  Result Date: 07/04/2022 CLINICAL DATA:  Abdominal pain EXAM: ABDOMEN - 1 VIEW COMPARISON:  04/09/2010 FINDINGS: Nonobstructive bowel gas pattern. Mild stool burden. Surgical clips overlie the mid abdomen. Degenerative changes of the spine and SI joints. Bilateral hip osteoarthritis. IMPRESSION: No evidence of bowel obstruction.  Mild stool burden. Electronically Signed   By: Maurine Simmering M.D.   On: 07/04/2022 11:41     Scheduled Meds:  allopurinol  300 mg Oral Daily   amLODipine  5 mg Oral Daily   arformoterol  15 mcg Nebulization BID   docusate sodium  100 mg Oral BID   heparin  5,000 Units Subcutaneous Q8H   hydrALAZINE  100 mg Oral Q8H   insulin aspart  0-15 Units Subcutaneous Q4H   insulin detemir  12 Units Subcutaneous QHS   mouth rinse  15 mL Mouth Rinse 4 times per day   polyethylene glycol  17 g Oral Daily   predniSONE  40 mg Oral Q breakfast   revefenacin  175 mcg Nebulization Daily   Continuous Infusions:  sodium chloride 75 mL/hr at 07/03/22 1708     LOS: 3 days   Time spent= 35 mins  Luana Tatro Arsenio Loader, MD [facility]  If 7PM-7AM, please contact night-coverage  07/04/2022, 1:06 PM

## 2022-07-05 DIAGNOSIS — J9601 Acute respiratory failure with hypoxia: Secondary | ICD-10-CM | POA: Diagnosis not present

## 2022-07-05 LAB — BASIC METABOLIC PANEL
Anion gap: 6 (ref 5–15)
BUN: 31 mg/dL — ABNORMAL HIGH (ref 8–23)
CO2: 27 mmol/L (ref 22–32)
Calcium: 8.4 mg/dL — ABNORMAL LOW (ref 8.9–10.3)
Chloride: 111 mmol/L (ref 98–111)
Creatinine, Ser: 1.02 mg/dL — ABNORMAL HIGH (ref 0.44–1.00)
GFR, Estimated: 59 mL/min — ABNORMAL LOW (ref >60)
Glucose, Bld: 110 mg/dL — ABNORMAL HIGH (ref 70–99)
Potassium: 4.3 mmol/L (ref 3.5–5.1)
Sodium: 144 mmol/L (ref 135–145)

## 2022-07-05 LAB — GLUCOSE, CAPILLARY
Glucose-Capillary: 103 mg/dL — ABNORMAL HIGH (ref 70–99)
Glucose-Capillary: 118 mg/dL — ABNORMAL HIGH (ref 70–99)
Glucose-Capillary: 156 mg/dL — ABNORMAL HIGH (ref 70–99)
Glucose-Capillary: 165 mg/dL — ABNORMAL HIGH (ref 70–99)
Glucose-Capillary: 188 mg/dL — ABNORMAL HIGH (ref 70–99)
Glucose-Capillary: 203 mg/dL — ABNORMAL HIGH (ref 70–99)

## 2022-07-05 LAB — CBC
HCT: 33.7 % — ABNORMAL LOW (ref 36.0–46.0)
Hemoglobin: 10.4 g/dL — ABNORMAL LOW (ref 12.0–15.0)
MCH: 29.9 pg (ref 26.0–34.0)
MCHC: 30.9 g/dL (ref 30.0–36.0)
MCV: 96.8 fL (ref 80.0–100.0)
Platelets: 227 10*3/uL (ref 150–400)
RBC: 3.48 MIL/uL — ABNORMAL LOW (ref 3.87–5.11)
RDW: 14.9 % (ref 11.5–15.5)
WBC: 8.4 10*3/uL (ref 4.0–10.5)
nRBC: 0 % (ref 0.0–0.2)

## 2022-07-05 LAB — MAGNESIUM: Magnesium: 2 mg/dL (ref 1.7–2.4)

## 2022-07-05 MED ORDER — OXYCODONE HCL 5 MG PO TABS
5.0000 mg | ORAL_TABLET | ORAL | Status: DC | PRN
  Administered 2022-07-05 – 2022-07-06 (×2): 5 mg via ORAL
  Filled 2022-07-05 (×2): qty 1

## 2022-07-05 NOTE — Progress Notes (Signed)
Brother, Doren Custard, called for an update. All questions answered and encouraged.

## 2022-07-05 NOTE — Progress Notes (Signed)
Mobility Specialist - Progress Note   07/05/22 1619  Mobility  Activity  (BLE)  Level of Assistance Minimal assist, patient does 75% or more  Range of Motion/Exercises Active;Right arm;Left arm  Activity Response Tolerated well  $Mobility charge 1 Mobility   Pt was found in bed and agreeable to upper body exercises. Pt was able to perform the exercises listed below with min-A on the L arm. At EOS was left with all necessities in reach and RN notified of session. Exercises Performed: Shoulder Taps x10 Cross Shoulder Taps x10 20 sec Arm Holds x2  Ferd Hibbs Mobility Specialist

## 2022-07-05 NOTE — Progress Notes (Signed)
PROGRESS NOTE    Jody Simpson  RCB:[id number] DOB: 03/10/1951 DOA: 07/01/2022 PCP: Jinny Blossom, [facility]   Brief Narrative:  71 year old female with past medical history of extreme morbid obesity, SVT, essential hypertension, hyperlipidemia, GERD, type 2 diabetes.  Patient recently admitted 1/14 to 1/25 with sepsis secondary to E. coli UTI, influenza, heart failure diastolic and physical deconditioning.  Patient declined skilled nursing return to independent living center.  In the ED patient initially thought to be septic secondary to community-acquired pneumonia but procalcitonin was negative therefore antibiotics were discontinued.  Patient was continued on prednisone and bronchodilators.  Also found to be in acute kidney injury requiring IV fluids.  Slowly improving.   Assessment & Plan:  Principal Problem:   Acute respiratory failure with hypoxia (HCC) Active Problems:   Diabetes mellitus, type 2 (HCC)   Gout   Essential hypertension   SOLITARY KIDNEY   Acute kidney injury superimposed on chronic kidney disease (HCC)   Asthma, chronic   Acute respiratory failure (HCC)     Acute respiratory failure with hypoxia and hypercapnia (HCC)/HAP/Asthma Groundglass nodule up to 1.5 cm - Concerns of bronchitis, cultures remain negative.  Respiratory panel negative.  She is on room air this morning -Follow-up CT in 3-6 months.  -BNP slightly up, ProCal is neg. finish total 5 days of oral prednisone.  Last day 2/4   Acute kidney injury superimposed on chronic kidney disease ([place]) Solitary kidney -creatinine peaked at 2.7.  Now around baseline of 1.0  Drowsy - Resolved  2:1 high-grade AV conduction defect - Recently seen by cardiology.  AV nodal's blockers discontinued.  Has follow-up outpatient appointment with EP.  Zio patch.  Congestive heart failure with preserved ejection fraction - Echocardiogram showing EF of 65%.  Needs outpatient follow-up with cardiology.    Diabetes mellitus type 2, insulin-dependent -Sliding scale and Accu-Chek.   Essential hypertension, uncontrolled - Start Norvasc.  Home lisinopril, Lasix and Aldactone on hold in the setting of AKI. - IV as needed   Gout -Stable   PT/OT-SNF  DVT prophylaxis: SQ Heparin Code Status: Full Code Family Communication:    Status is: Inpatient Remains inpatient appropriate because: [facility] consulted for SNF placement.  Body mass index is 49.6 kg/m.  Pressure Injury 06/15/22 Buttocks Right Stage 2 -  Partial thickness loss of dermis presenting as a shallow open injury with a red, pink wound bed without slough. (Active)  06/15/22 1427  Location: Buttocks  Location Orientation: Right  Staging: Stage 2 -  Partial thickness loss of dermis presenting as a shallow open injury with a red, pink wound bed without slough.  Wound Description (Comments):   Present on Admission: Yes        Subjective: Feeling okay no complaints. Examination: Constitutional: Not in acute distress, chronically ill.  Morbid obesity.  Generally very weak appearing Respiratory: Clear to auscultation bilaterally Cardiovascular: Normal sinus rhythm, no rubs Abdomen: Nontender nondistended good bowel sounds Musculoskeletal: Some dependent edema of bilateral upper and lower extremities. Skin: No rashes seen Neurologic: CN 2-12 grossly intact.  And nonfocal Psychiatric: Normal judgment and insight. Alert and oriented x 3. Normal mood.  Objective: Vitals:   07/04/22 1952 07/05/22 0500 07/05/22 0740 07/05/22 1342  BP: (!) 152/65 (!) 160/73  (!) 179/76  Pulse: 88 82  99  Resp: 19 (!) 21  18  Temp: 97.6 F (36.4 C) 97.8 F (36.6 C)  98.5 F (36.9 C)  TempSrc: Oral Oral  Oral  SpO2: 96% 97%  94% 91%  Weight:      Height:        Intake/Output Summary (Last 24 hours) at 07/05/2022 1353 Last data filed at 07/05/2022 1230 Gross per 24 hour  Intake 2477.36 ml  Output 700 ml  Net 1777.36 ml   Filed Weights    07/01/22 1746  Weight: (!) 139.4 kg     Data Reviewed:   CBC: Recent Labs  Lab 07/01/22 1711 07/02/22 0528 07/03/22 0514 07/04/22 0420 07/05/22 0505  WBC 10.6* 9.4 9.7 9.6 8.4  NEUTROABS 7.0 8.7*  --   --   --   HGB 12.4 10.9* 10.3* 10.8* 10.4*  HCT 41.7 37.4 34.8* 35.8* 33.7*  MCV 99.0 100.0 98.3 96.2 96.8  PLT 390 305 278 261 397   Basic Metabolic Panel: Recent Labs  Lab 07/01/22 1711 07/02/22 0528 07/03/22 0514 07/04/22 0420 07/05/22 0505  NA 142 138 140 142 144  K 4.4 4.6 4.1 4.3 4.3  CL 101 101 103 107 111  CO2 '28 24 28 26 27  '$ GLUCOSE 153* 261* 110* 126* 110*  BUN 20 25* 32* 33* 31*  CREATININE 2.30* 2.73* 1.92* 1.20* 1.02*  CALCIUM 8.2* 7.9* 8.2* 8.6* 8.4*  MG  --   --  2.2 2.5* 2.0   GFR: Estimated Creatinine Clearance: 74 mL/min (A) (by C-G formula based on SCr of 1.02 mg/dL (H)). Liver Function Tests: Recent Labs  Lab 07/01/22 1711  AST 16  ALT 11  ALKPHOS 100  BILITOT 0.7  PROT 7.1  ALBUMIN 3.3*   No results for input(s): "LIPASE", "AMYLASE" in the last 168 hours. Recent Labs  Lab 07/01/22 1732 07/03/22 1149  AMMONIA 17 <10   Coagulation Profile: No results for input(s): "INR", "PROTIME" in the last 168 hours. Cardiac Enzymes: Recent Labs  Lab 07/02/22 0528  CKTOTAL 39   BNP (last 3 results) No results for input(s): "PROBNP" in the last 8760 hours. HbA1C: No results for input(s): "HGBA1C" in the last 72 hours. CBG: Recent Labs  Lab 07/04/22 1951 07/05/22 0003 07/05/22 0353 07/05/22 0743 07/05/22 1206  GLUCAP 173* 156* 118* 103* 165*   Lipid Profile: No results for input(s): "CHOL", "HDL", "LDLCALC", "TRIG", "CHOLHDL", "LDLDIRECT" in the last 72 hours. Thyroid Function Tests: No results for input(s): "TSH", "T4TOTAL", "FREET4", "T3FREE", "THYROIDAB" in the last 72 hours. Anemia Panel: No results for input(s): "VITAMINB12", "FOLATE", "FERRITIN", "TIBC", "IRON", "RETICCTPCT" in the last 72 hours. Sepsis Labs: Recent Labs   Lab 07/01/22 1711 07/02/22 0528  PROCALCITON  --  <0.10  LATICACIDVEN 1.2  --     Recent Results (from the past 240 hour(s))  Culture, blood (Routine X 2) w Reflex to ID Panel     Status: None (Preliminary result)   Collection Time: 07/01/22  5:11 PM   Specimen: BLOOD RIGHT ARM  Result Value Ref Range Status   Specimen Description   Final    BLOOD RIGHT ARM Performed at [facility], [place] [address], [place], [place] [address]    Special Requests   Final    BOTTLES DRAWN AEROBIC AND ANAEROBIC Blood Culture results may not be optimal due to an inadequate volume of blood received in culture bottles Performed at [place] [address], [place], [place] [address]    Culture   Final    NO GROWTH 4 DAYS Performed at [facility], [place] [address] [place],  [address]    Report Status PENDING  Incomplete  Resp panel  by RT-PCR (RSV, Flu A&B, Covid) Anterior Nasal Swab     Status: None   Collection Time: 07/01/22  5:22 PM   Specimen: Anterior Nasal Swab  Result Value Ref Range Status   SARS Coronavirus 2 by RT PCR NEGATIVE NEGATIVE Final    Comment: (NOTE) SARS-CoV-2 target nucleic acids are NOT DETECTED.  The SARS-CoV-2 RNA is generally detectable in upper respiratory specimens during the acute phase of infection. The lowest concentration of SARS-CoV-2 viral copies this assay can detect is 138 copies/mL. A negative result does not preclude SARS-Cov-2 infection and should not be used as the sole basis for treatment or other patient management decisions. A negative result may occur with  improper specimen collection/handling, submission of specimen other than nasopharyngeal swab, presence of viral mutation(s) within the areas targeted by this assay, and inadequate number of viral copies(<138 copies/mL). A negative result must be combined with clinical observations, patient history, and epidemiological information. The expected result is  Negative.  Fact Sheet for Patients:  EntrepreneurPulse.com.au  Fact Sheet for Healthcare Providers:  IncredibleEmployment.be  This test is no t yet approved or cleared by the [place] FDA and  has been authorized for detection and/or diagnosis of SARS-CoV-2 by FDA under an Emergency Use Authorization (EUA). This EUA will remain  in effect (meaning this test can be used) for the duration of the COVID-19 declaration under Section 564(b)(1) of the Act, 21 U.S.C.section 360bbb-3(b)(1), unless the authorization is terminated  or revoked sooner.       Influenza A by PCR NEGATIVE NEGATIVE Final   Influenza B by PCR NEGATIVE NEGATIVE Final    Comment: (NOTE) The Xpert Xpress SARS-CoV-2/FLU/RSV plus assay is intended as an aid in the diagnosis of influenza from Nasopharyngeal swab specimens and should not be used as a sole basis for treatment. Nasal washings and aspirates are unacceptable for Xpert Xpress SARS-CoV-2/FLU/RSV testing.  Fact Sheet for Patients: EntrepreneurPulse.com.au  Fact Sheet for Healthcare Providers: IncredibleEmployment.be  This test is not yet approved or cleared by the [place] FDA and has been authorized for detection and/or diagnosis of SARS-CoV-2 by FDA under an Emergency Use Authorization (EUA). This EUA will remain in effect (meaning this test can be used) for the duration of the COVID-19 declaration under Section 564(b)(1) of the Act, 21 U.S.C. section 360bbb-3(b)(1), unless the authorization is terminated or revoked.     Resp Syncytial Virus by PCR NEGATIVE NEGATIVE Final    Comment: (NOTE) Fact Sheet for Patients: EntrepreneurPulse.com.au  Fact Sheet for Healthcare Providers: IncredibleEmployment.be  This test is not yet approved or cleared by the [place] FDA and has been authorized for detection and/or diagnosis of  SARS-CoV-2 by FDA under an Emergency Use Authorization (EUA). This EUA will remain in effect (meaning this test can be used) for the duration of the COVID-19 declaration under Section 564(b)(1) of the Act, 21 U.S.C. section 360bbb-3(b)(1), unless the authorization is terminated or revoked.  Performed at [facility], [place] [address], [place], [place] [address]   Culture, blood (Routine X 2) w Reflex to ID Panel     Status: None (Preliminary result)   Collection Time: 07/01/22  7:10 PM   Specimen: BLOOD  Result Value Ref Range Status   Specimen Description   Final    BLOOD SITE NOT SPECIFIED Performed at [place] [address] [place], [place] [address]    Special Requests   Final    BOTTLES DRAWN AEROBIC AND ANAEROBIC Blood Culture  adequate volume Performed at [place] [address] [place], [place] [address]    Culture   Final    NO GROWTH 4 DAYS Performed at [facility], [place] [address], [place], [place] [address]    Report Status PENDING  Incomplete  Respiratory (~20 pathogens) panel by PCR     Status: None   Collection Time: 07/02/22 10:41 AM   Specimen: Nasopharyngeal Swab; Respiratory  Result Value Ref Range Status   Adenovirus NOT DETECTED NOT DETECTED Final   Coronavirus 229E NOT DETECTED NOT DETECTED Final    Comment: (NOTE) The Coronavirus on the Respiratory Panel, DOES NOT test for the novel  Coronavirus (2019 nCoV)    Coronavirus HKU1 NOT DETECTED NOT DETECTED Final   Coronavirus NL63 NOT DETECTED NOT DETECTED Final   Coronavirus OC43 NOT DETECTED NOT DETECTED Final   Metapneumovirus NOT DETECTED NOT DETECTED Final   Rhinovirus / Enterovirus NOT DETECTED NOT DETECTED Final   Influenza A NOT DETECTED NOT DETECTED Final   Influenza B NOT DETECTED NOT DETECTED Final   Parainfluenza Virus 1 NOT DETECTED NOT DETECTED Final   Parainfluenza Virus 2 NOT DETECTED NOT DETECTED Final    Parainfluenza Virus 3 NOT DETECTED NOT DETECTED Final   Parainfluenza Virus 4 NOT DETECTED NOT DETECTED Final   Respiratory Syncytial Virus NOT DETECTED NOT DETECTED Final   Bordetella pertussis NOT DETECTED NOT DETECTED Final   Bordetella Parapertussis NOT DETECTED NOT DETECTED Final   Chlamydophila pneumoniae NOT DETECTED NOT DETECTED Final   Mycoplasma pneumoniae NOT DETECTED NOT DETECTED Final    Comment: Performed at [facility], [place] [address], [place], [place] [address]         Radiology Studies: DG Abd 1 View  Result Date: 07/04/2022 CLINICAL DATA:  Abdominal pain EXAM: ABDOMEN - 1 VIEW COMPARISON:  04/09/2010 FINDINGS: Nonobstructive bowel gas pattern. Mild stool burden. Surgical clips overlie the mid abdomen. Degenerative changes of the spine and SI joints. Bilateral hip osteoarthritis. IMPRESSION: No evidence of bowel obstruction.  Mild stool burden. Electronically Signed   By: Maurine Simmering M.D.   On: 07/04/2022 11:41     Scheduled Meds:  allopurinol  300 mg Oral Daily   amLODipine  5 mg Oral Daily   arformoterol  15 mcg Nebulization BID   docusate sodium  100 mg Oral BID   heparin  5,000 Units Subcutaneous Q8H   hydrALAZINE  100 mg Oral Q8H   insulin aspart  0-15 Units Subcutaneous Q4H   insulin detemir  12 Units Subcutaneous QHS   mouth rinse  15 mL Mouth Rinse 4 times per day   polyethylene glycol  17 g Oral Daily   predniSONE  40 mg Oral Q breakfast   revefenacin  175 mcg Nebulization Daily   Continuous Infusions:     LOS: 4 days   Time spent= 35 mins  Gilda Abboud Arsenio Loader, MD [facility]  If 7PM-7AM, please contact night-coverage  07/05/2022, 1:53 PM

## 2022-07-06 DIAGNOSIS — J9601 Acute respiratory failure with hypoxia: Secondary | ICD-10-CM | POA: Diagnosis not present

## 2022-07-06 LAB — GLUCOSE, CAPILLARY
Glucose-Capillary: 102 mg/dL — ABNORMAL HIGH (ref 70–99)
Glucose-Capillary: 128 mg/dL — ABNORMAL HIGH (ref 70–99)
Glucose-Capillary: 132 mg/dL — ABNORMAL HIGH (ref 70–99)
Glucose-Capillary: 158 mg/dL — ABNORMAL HIGH (ref 70–99)
Glucose-Capillary: 160 mg/dL — ABNORMAL HIGH (ref 70–99)
Glucose-Capillary: 187 mg/dL — ABNORMAL HIGH (ref 70–99)
Glucose-Capillary: 87 mg/dL (ref 70–99)

## 2022-07-06 LAB — CBC
HCT: 36.3 % (ref 36.0–46.0)
Hemoglobin: 11.1 g/dL — ABNORMAL LOW (ref 12.0–15.0)
MCH: 29.3 pg (ref 26.0–34.0)
MCHC: 30.6 g/dL (ref 30.0–36.0)
MCV: 95.8 fL (ref 80.0–100.0)
Platelets: 234 10*3/uL (ref 150–400)
RBC: 3.79 MIL/uL — ABNORMAL LOW (ref 3.87–5.11)
RDW: 15.3 % (ref 11.5–15.5)
WBC: 9.4 10*3/uL (ref 4.0–10.5)
nRBC: 0 % (ref 0.0–0.2)

## 2022-07-06 LAB — BASIC METABOLIC PANEL
Anion gap: 7 (ref 5–15)
BUN: 25 mg/dL — ABNORMAL HIGH (ref 8–23)
CO2: 29 mmol/L (ref 22–32)
Calcium: 8.6 mg/dL — ABNORMAL LOW (ref 8.9–10.3)
Chloride: 107 mmol/L (ref 98–111)
Creatinine, Ser: 0.96 mg/dL (ref 0.44–1.00)
GFR, Estimated: >60 mL/min (ref >60)
Glucose, Bld: 92 mg/dL (ref 70–99)
Potassium: 4 mmol/L (ref 3.5–5.1)
Sodium: 143 mmol/L (ref 135–145)

## 2022-07-06 LAB — CULTURE, BLOOD (ROUTINE X 2)
Culture: NO GROWTH
Culture: NO GROWTH
Special Requests: ADEQUATE

## 2022-07-06 LAB — MAGNESIUM: Magnesium: 1.9 mg/dL (ref 1.7–2.4)

## 2022-07-06 MED ORDER — CAMPHOR-MENTHOL 0.5-0.5 % EX LOTN
TOPICAL_LOTION | CUTANEOUS | Status: DC | PRN
  Filled 2022-07-06: qty 222

## 2022-07-06 MED ORDER — LISINOPRIL 20 MG PO TABS
20.0000 mg | ORAL_TABLET | Freq: Every day | ORAL | Status: DC
  Administered 2022-07-06 – 2022-07-08 (×3): 20 mg via ORAL
  Filled 2022-07-06 (×3): qty 1

## 2022-07-06 NOTE — Progress Notes (Signed)
PROGRESS NOTE    Jody Simpson  VFI:[id number] DOB: 12/18/1950 DOA: 07/01/2022 PCP: Jinny Blossom, [facility]   Brief Narrative:  72 year old female with past medical history of extreme morbid obesity, SVT, essential hypertension, hyperlipidemia, GERD, type 2 diabetes.  Patient recently admitted 1/14 to 1/25 with sepsis secondary to E. coli UTI, influenza, heart failure diastolic and physical deconditioning.  Patient declined skilled nursing return to independent living center.  In the ED patient initially thought to be septic secondary to community-acquired pneumonia but procalcitonin was negative therefore antibiotics were discontinued.  Patient was continued on prednisone and bronchodilators.  Also found to be in acute kidney injury requiring IV fluids.  Slowly improving.   Assessment & Plan:  Principal Problem:   Acute respiratory failure with hypoxia (HCC) Active Problems:   Diabetes mellitus, type 2 (HCC)   Gout   Essential hypertension   SOLITARY KIDNEY   Acute kidney injury superimposed on chronic kidney disease (HCC)   Asthma, chronic   Acute respiratory failure (HCC)     Acute respiratory failure with hypoxia and hypercapnia (HCC)/HAP/Asthma Groundglass nodule up to 1.5 cm - Concerns of bronchitis, cultures remain negative.  Respiratory panel negative.  She is on room air this morning -Follow-up CT in 3-6 months.  -BNP slightly up, ProCal is neg. finish total 5 days of oral prednisone. last day today   Acute kidney injury superimposed on chronic kidney disease ([place]) Solitary kidney -creatinine peaked at 2.7.  Now around baseline of 1.0  Drowsy - Resolved  2:1 high-grade AV conduction defect - Recently seen by cardiology.  AV nodal's blockers discontinued.  Has follow-up outpatient appointment with EP.  Zio patch.  Congestive heart failure with preserved ejection fraction - Echocardiogram showing EF of 65%.  Needs outpatient follow-up with cardiology.    Diabetes mellitus type 2, insulin-dependent -Sliding scale and Accu-Chek.   Essential hypertension, uncontrolled - On Norvasc.  Restart lisinopril. Lasix and Aldactone on hold in the setting of AKI. - IV as needed   Gout -Stable   PT/OT-SNF  DVT prophylaxis: SQ Heparin Code Status: Full Code Family Communication:    Status is: Inpatient Remains inpatient appropriate because: [facility] consulted for SNF placement.  Body mass index is 49.6 kg/m.  Pressure Injury 06/15/22 Buttocks Right Stage 2 -  Partial thickness loss of dermis presenting as a shallow open injury with a red, pink wound bed without slough. (Active)  06/15/22 1427  Location: Buttocks  Location Orientation: Right  Staging: Stage 2 -  Partial thickness loss of dermis presenting as a shallow open injury with a red, pink wound bed without slough.  Wound Description (Comments):   Present on Admission: Yes        Subjective: Feeling well no complaints Examination: Constitutional: Not in acute distress.  Morbid obesity Respiratory: Clear to auscultation bilaterally Cardiovascular: Normal sinus rhythm, no rubs Abdomen: Nontender nondistended good bowel sounds Musculoskeletal: No edema noted Skin: No rashes seen Neurologic: CN 2-12 grossly intact.  And nonfocal Psychiatric: Normal judgment and insight. Alert and oriented x 3. Normal mood.  Objective: Vitals:   07/05/22 2004 07/05/22 2141 07/06/22 0527 07/06/22 0839  BP: (!) 172/64 (!) 152/65 (!) 169/74   Pulse: (!) 104  87   Resp: 20     Temp: 98.6 F (37 C)     TempSrc: Oral     SpO2: 93%   93%  Weight:      Height:        Intake/Output Summary (Last  24 hours) at 07/06/2022 1140 Last data filed at 07/06/2022 0040 Gross per 24 hour  Intake 694 ml  Output 600 ml  Net 94 ml   Filed Weights   07/01/22 1746  Weight: (!) 139.4 kg     Data Reviewed:   CBC: Recent Labs  Lab 07/01/22 1711 07/02/22 0528 07/03/22 0514 07/04/22 0420 07/05/22 0505  07/06/22 0453  WBC 10.6* 9.4 9.7 9.6 8.4 9.4  NEUTROABS 7.0 8.7*  --   --   --   --   HGB 12.4 10.9* 10.3* 10.8* 10.4* 11.1*  HCT 41.7 37.4 34.8* 35.8* 33.7* 36.3  MCV 99.0 100.0 98.3 96.2 96.8 95.8  PLT 390 305 278 261 227 169   Basic Metabolic Panel: Recent Labs  Lab 07/02/22 0528 07/03/22 0514 07/04/22 0420 07/05/22 0505 07/06/22 0453  NA 138 140 142 144 143  K 4.6 4.1 4.3 4.3 4.0  CL 101 103 107 111 107  CO2 '24 28 26 27 29  '$ GLUCOSE 261* 110* 126* 110* 92  BUN 25* 32* 33* 31* 25*  CREATININE 2.73* 1.92* 1.20* 1.02* 0.96  CALCIUM 7.9* 8.2* 8.6* 8.4* 8.6*  MG  --  2.2 2.5* 2.0 1.9   GFR: Estimated Creatinine Clearance: 78.6 mL/min (by C-G formula based on SCr of 0.96 mg/dL). Liver Function Tests: Recent Labs  Lab 07/01/22 1711  AST 16  ALT 11  ALKPHOS 100  BILITOT 0.7  PROT 7.1  ALBUMIN 3.3*   No results for input(s): "LIPASE", "AMYLASE" in the last 168 hours. Recent Labs  Lab 07/01/22 1732 07/03/22 1149  AMMONIA 17 <10   Coagulation Profile: No results for input(s): "INR", "PROTIME" in the last 168 hours. Cardiac Enzymes: Recent Labs  Lab 07/02/22 0528  CKTOTAL 39   BNP (last 3 results) No results for input(s): "PROBNP" in the last 8760 hours. HbA1C: No results for input(s): "HGBA1C" in the last 72 hours. CBG: Recent Labs  Lab 07/05/22 1627 07/05/22 2001 07/06/22 0011 07/06/22 0355 07/06/22 0750  GLUCAP 188* 203* 128* 87 102*   Lipid Profile: No results for input(s): "CHOL", "HDL", "LDLCALC", "TRIG", "CHOLHDL", "LDLDIRECT" in the last 72 hours. Thyroid Function Tests: No results for input(s): "TSH", "T4TOTAL", "FREET4", "T3FREE", "THYROIDAB" in the last 72 hours. Anemia Panel: No results for input(s): "VITAMINB12", "FOLATE", "FERRITIN", "TIBC", "IRON", "RETICCTPCT" in the last 72 hours. Sepsis Labs: Recent Labs  Lab 07/01/22 1711 07/02/22 0528  PROCALCITON  --  <0.10  LATICACIDVEN 1.2  --     Recent Results (from the past 240  hour(s))  Culture, blood (Routine X 2) w Reflex to ID Panel     Status: None   Collection Time: 07/01/22  5:11 PM   Specimen: BLOOD RIGHT ARM  Result Value Ref Range Status   Specimen Description   Final    BLOOD RIGHT ARM Performed at [facility], [place] [address], [place], [place] [address]    Special Requests   Final    BOTTLES DRAWN AEROBIC AND ANAEROBIC Blood Culture results may not be optimal due to an inadequate volume of blood received in culture bottles Performed at [place] [address] [place], [place] [address]    Culture   Final    NO GROWTH 5 DAYS Performed at [facility], [place] [address] [place], [place] [address]    Report Status 07/06/2022 FINAL  Final  Resp panel by RT-PCR (RSV, Flu A&B, Covid) Anterior Nasal Swab     Status: None  Collection Time: 07/01/22  5:22 PM   Specimen: Anterior Nasal Swab  Result Value Ref Range Status   SARS Coronavirus 2 by RT PCR NEGATIVE NEGATIVE Final    Comment: (NOTE) SARS-CoV-2 target nucleic acids are NOT DETECTED.  The SARS-CoV-2 RNA is generally detectable in upper respiratory specimens during the acute phase of infection. The lowest concentration of SARS-CoV-2 viral copies this assay can detect is 138 copies/mL. A negative result does not preclude SARS-Cov-2 infection and should not be used as the sole basis for treatment or other patient management decisions. A negative result may occur with  improper specimen collection/handling, submission of specimen other than nasopharyngeal swab, presence of viral mutation(s) within the areas targeted by this assay, and inadequate number of viral copies(<138 copies/mL). A negative result must be combined with clinical observations, patient history, and epidemiological information. The expected result is Negative.  Fact Sheet for Patients:  EntrepreneurPulse.com.au  Fact Sheet for Healthcare Providers:   IncredibleEmployment.be  This test is no t yet approved or cleared by the [place] FDA and  has been authorized for detection and/or diagnosis of SARS-CoV-2 by FDA under an Emergency Use Authorization (EUA). This EUA will remain  in effect (meaning this test can be used) for the duration of the COVID-19 declaration under Section 564(b)(1) of the Act, 21 U.S.C.section 360bbb-3(b)(1), unless the authorization is terminated  or revoked sooner.       Influenza A by PCR NEGATIVE NEGATIVE Final   Influenza B by PCR NEGATIVE NEGATIVE Final    Comment: (NOTE) The Xpert Xpress SARS-CoV-2/FLU/RSV plus assay is intended as an aid in the diagnosis of influenza from Nasopharyngeal swab specimens and should not be used as a sole basis for treatment. Nasal washings and aspirates are unacceptable for Xpert Xpress SARS-CoV-2/FLU/RSV testing.  Fact Sheet for Patients: EntrepreneurPulse.com.au  Fact Sheet for Healthcare Providers: IncredibleEmployment.be  This test is not yet approved or cleared by the [place] FDA and has been authorized for detection and/or diagnosis of SARS-CoV-2 by FDA under an Emergency Use Authorization (EUA). This EUA will remain in effect (meaning this test can be used) for the duration of the COVID-19 declaration under Section 564(b)(1) of the Act, 21 U.S.C. section 360bbb-3(b)(1), unless the authorization is terminated or revoked.     Resp Syncytial Virus by PCR NEGATIVE NEGATIVE Final    Comment: (NOTE) Fact Sheet for Patients: EntrepreneurPulse.com.au  Fact Sheet for Healthcare Providers: IncredibleEmployment.be  This test is not yet approved or cleared by the [place] FDA and has been authorized for detection and/or diagnosis of SARS-CoV-2 by FDA under an Emergency Use Authorization (EUA). This EUA will remain in effect (meaning this test can be used) for  the duration of the COVID-19 declaration under Section 564(b)(1) of the Act, 21 U.S.C. section 360bbb-3(b)(1), unless the authorization is terminated or revoked.  Performed at [facility], [place] [address] [place], [place] [address]   Culture, blood (Routine X 2) w Reflex to ID Panel     Status: None   Collection Time: 07/01/22  7:10 PM   Specimen: BLOOD  Result Value Ref Range Status   Specimen Description   Final    BLOOD SITE NOT SPECIFIED Performed at [place] [address] [place],  [address]    Special Requests   Final    BOTTLES DRAWN AEROBIC AND ANAEROBIC Blood Culture adequate volume Performed at [place] [address] [place],  [address]    Culture  Final    NO GROWTH 5 DAYS Performed at [facility], [place] [address] [place], [place] [address]    Report Status 07/06/2022 FINAL  Final  Respiratory (~20 pathogens) panel by PCR     Status: None   Collection Time: 07/02/22 10:41 AM   Specimen: Nasopharyngeal Swab; Respiratory  Result Value Ref Range Status   Adenovirus NOT DETECTED NOT DETECTED Final   Coronavirus 229E NOT DETECTED NOT DETECTED Final    Comment: (NOTE) The Coronavirus on the Respiratory Panel, DOES NOT test for the novel  Coronavirus (2019 nCoV)    Coronavirus HKU1 NOT DETECTED NOT DETECTED Final   Coronavirus NL63 NOT DETECTED NOT DETECTED Final   Coronavirus OC43 NOT DETECTED NOT DETECTED Final   Metapneumovirus NOT DETECTED NOT DETECTED Final   Rhinovirus / Enterovirus NOT DETECTED NOT DETECTED Final   Influenza A NOT DETECTED NOT DETECTED Final   Influenza B NOT DETECTED NOT DETECTED Final   Parainfluenza Virus 1 NOT DETECTED NOT DETECTED Final   Parainfluenza Virus 2 NOT DETECTED NOT DETECTED Final   Parainfluenza Virus 3 NOT DETECTED NOT DETECTED Final   Parainfluenza Virus 4 NOT DETECTED NOT DETECTED Final   Respiratory Syncytial Virus NOT DETECTED  NOT DETECTED Final   Bordetella pertussis NOT DETECTED NOT DETECTED Final   Bordetella Parapertussis NOT DETECTED NOT DETECTED Final   Chlamydophila pneumoniae NOT DETECTED NOT DETECTED Final   Mycoplasma pneumoniae NOT DETECTED NOT DETECTED Final    Comment: Performed at [facility], [place]. [address] [place], [place] [address]         Radiology Studies: No results found.   Scheduled Meds:  allopurinol  300 mg Oral Daily   amLODipine  5 mg Oral Daily   arformoterol  15 mcg Nebulization BID   docusate sodium  100 mg Oral BID   heparin  5,000 Units Subcutaneous Q8H   hydrALAZINE  100 mg Oral Q8H   insulin aspart  0-15 Units Subcutaneous Q4H   insulin detemir  12 Units Subcutaneous QHS   lisinopril  20 mg Oral Daily   mouth rinse  15 mL Mouth Rinse 4 times per day   polyethylene glycol  17 g Oral Daily   predniSONE  40 mg Oral Q breakfast   revefenacin  175 mcg Nebulization Daily   Continuous Infusions:     LOS: 5 days   Time spent= 35 mins  Rosie Torrez Arsenio Loader, MD [facility]  If 7PM-7AM, please contact night-coverage  07/06/2022, 11:40 AM

## 2022-07-06 NOTE — Plan of Care (Signed)
  Problem: Fluid Volume: Goal: Ability to maintain a balanced intake and output will improve Outcome: Progressing   Problem: Coping: Goal: Level of anxiety will decrease Outcome: Progressing   Problem: Safety: Goal: Ability to remain free from injury will improve Outcome: Progressing   Problem: Metabolic: Goal: Ability to maintain appropriate glucose levels will improve Outcome: Progressing

## 2022-07-06 NOTE — Progress Notes (Signed)
V60 bipap on standby

## 2022-07-06 NOTE — Progress Notes (Signed)
Occupational Therapy Treatment Patient Details Name: Jody Simpson MRN: [id number] DOB: 01-12-1951 Today's Date: 07/06/2022   History of present illness Patient is a 72 year old female who was brought in by EMS with confused. patient was admitted with Acute respiratory failure with hypoxia and hypercapnia,HAP,Asthma and AKI. PMH: with past medical history of extreme morbid obesity, SVT, essential hypertension, hyperlipidemia, GERD, type 2 diabetes. Patient recently admitted 1/14 to 1/25 with sepsis secondary to E. coli UTI, influenza, heart failure diastolic and physical deconditioning. Patient declined skilled nursing return to independent living center, respiratory failure, AKI.   OT comments  Patient was able to engage in self feeding tasks with less assistance compared to evaluation. Patient was educated on proper positioning and importance of movement win BLE and BUE to reduce edema and maintain ability to engage in ADLs. Patient verbalized and demonstrated understanding. Patient's discharge plan remains appropriate at this time. OT will continue to follow acutely.     Recommendations for follow up therapy are one component of a multi-disciplinary discharge planning process, led by the attending physician.  Recommendations may be updated based on patient status, additional functional criteria and insurance authorization.    Follow Up Recommendations  Skilled nursing-short term rehab (<3 hours/day)     Assistance Recommended at Discharge Frequent or constant Supervision/Assistance  Patient can return home with the following  Two people to help with walking and/or transfers;Two people to help with bathing/dressing/bathroom;Assistance with cooking/housework;Assistance with feeding;Direct supervision/assist for medications management;Direct supervision/assist for financial management;Help with stairs or ramp for entrance;Assist for transportation   Equipment Recommendations  None recommended  by OT       Precautions / Restrictions Precautions Precautions: Fall Precaution Comments: monitor sats Restrictions Weight Bearing Restrictions: No              ADL either performed or assessed with clinical judgement   ADL Overall ADL's : Needs assistance/impaired Eating/Feeding: Minimal assistance Eating/Feeding Details (indicate cue type and reason): in chair position in bed. patient able to scoop and spear with RUE and bring to mouth with no spillage noted. patient able to pick up typical cup with MI and bring to mouth. patient reported having difficult time with chewing and that she felt like she choked on grape skin when attempting to eat it. not observed by therapist. nurse made aware. patient was able to scoop ice cream with RUE and bring to mouth with no spillage. patient was able to hold cup in L hand with no spillage or dropping.         General ADL Comments: patien was educated on proper positioning of BUE on pillows to prevent edema buildup. patient was noted to have +3 edema in L hand at this time. patient demonstrated ability to use BUE to assist with self feeding as noted above. patient was educated on importance of keeping BUE and BLE moving to maintain ability to engage in ADLs and reduce edema. patient verbalized and demonstrated understanding.      Cognition Arousal/Alertness: Awake/alert Behavior During Therapy: Flat affect Overall Cognitive Status: Impaired/Different from baseline                       Pertinent Vitals/ Pain       Pain Assessment Pain Assessment: No/denies pain Faces Pain Scale: No hurt         Frequency  Min 2X/week        Progress Toward Goals  OT Goals(current goals can now be found in the care  plan section)  Progress towards OT goals: Progressing toward goals     Plan         AM-PAC OT "6 Clicks" Daily Activity     Outcome Measure   Help from another person eating meals?: A Little Help from another person  taking care of personal grooming?: A Lot Help from another person toileting, which includes using toliet, bedpan, or urinal?: Total Help from another person bathing (including washing, rinsing, drying)?: Total Help from another person to put on and taking off regular upper body clothing?: Total Help from another person to put on and taking off regular lower body clothing?: Total 6 Click Score: 9    End of Session    OT Visit Diagnosis: Muscle weakness (generalized) (M62.81)   Activity Tolerance Patient tolerated treatment well   Patient Left in bed;with call bell/phone within reach;with bed alarm set   Nurse Communication Mobility status;Other (comment) (patients report of choking)        Time: 1324-4010 OT Time Calculation (min): 26 min  Charges: OT General Charges $OT Visit: 1 Visit OT Treatments $Self Care/Home Management : 23-37 mins  Rennie Plowman, MS Acute Rehabilitation Department Office# [phone]   Willa Rough 07/06/2022, 3:36 PM

## 2022-07-07 DIAGNOSIS — J9601 Acute respiratory failure with hypoxia: Secondary | ICD-10-CM | POA: Diagnosis not present

## 2022-07-07 LAB — BASIC METABOLIC PANEL
Anion gap: 8 (ref 5–15)
BUN: 21 mg/dL (ref 8–23)
CO2: 27 mmol/L (ref 22–32)
Calcium: 8.4 mg/dL — ABNORMAL LOW (ref 8.9–10.3)
Chloride: 104 mmol/L (ref 98–111)
Creatinine, Ser: 0.83 mg/dL (ref 0.44–1.00)
GFR, Estimated: >60 mL/min (ref >60)
Glucose, Bld: 77 mg/dL (ref 70–99)
Potassium: 3.5 mmol/L (ref 3.5–5.1)
Sodium: 139 mmol/L (ref 135–145)

## 2022-07-07 LAB — CBC
HCT: 37.8 % (ref 36.0–46.0)
Hemoglobin: 11.5 g/dL — ABNORMAL LOW (ref 12.0–15.0)
MCH: 29 pg (ref 26.0–34.0)
MCHC: 30.4 g/dL (ref 30.0–36.0)
MCV: 95.5 fL (ref 80.0–100.0)
Platelets: 228 10*3/uL (ref 150–400)
RBC: 3.96 MIL/uL (ref 3.87–5.11)
RDW: 15.3 % (ref 11.5–15.5)
WBC: 10.4 10*3/uL (ref 4.0–10.5)
nRBC: 0 % (ref 0.0–0.2)

## 2022-07-07 LAB — GLUCOSE, CAPILLARY
Glucose-Capillary: 87 mg/dL (ref 70–99)
Glucose-Capillary: 90 mg/dL (ref 70–99)
Glucose-Capillary: 137 mg/dL — ABNORMAL HIGH (ref 70–99)
Glucose-Capillary: 229 mg/dL — ABNORMAL HIGH (ref 70–99)
Glucose-Capillary: 232 mg/dL — ABNORMAL HIGH (ref 70–99)

## 2022-07-07 LAB — MAGNESIUM: Magnesium: 1.6 mg/dL — ABNORMAL LOW (ref 1.7–2.4)

## 2022-07-07 MED ORDER — POTASSIUM CHLORIDE 20 MEQ PO PACK
40.0000 meq | PACK | Freq: Once | ORAL | Status: AC
  Administered 2022-07-07: 40 meq via ORAL
  Filled 2022-07-07: qty 2

## 2022-07-07 MED ORDER — MAGNESIUM SULFATE 2 GM/50ML IV SOLN
2.0000 g | Freq: Once | INTRAVENOUS | Status: AC
  Administered 2022-07-07: 2 g via INTRAVENOUS
  Filled 2022-07-07: qty 50

## 2022-07-07 NOTE — Plan of Care (Signed)

## 2022-07-07 NOTE — Progress Notes (Signed)
PROGRESS NOTE    DIMA MINI  EQA:[id number] DOB: 03/08/1951 DOA: 07/01/2022 PCP: Jinny Blossom, [facility]   Brief Narrative:  72 year old female with past medical history of extreme morbid obesity, SVT, essential hypertension, hyperlipidemia, GERD, type 2 diabetes.  Patient recently admitted 1/14 to 1/25 with sepsis secondary to E. coli UTI, influenza, heart failure diastolic and physical deconditioning.  Patient declined skilled nursing return to independent living center.  In the ED patient initially thought to be septic secondary to community-acquired pneumonia but procalcitonin was negative therefore antibiotics were discontinued.  Patient was continued on prednisone and bronchodilators.  Also found to be in acute kidney injury requiring IV fluids.  Slowly improving.   Assessment & Plan:  Principal Problem:   Acute respiratory failure with hypoxia (HCC) Active Problems:   Diabetes mellitus, type 2 (HCC)   Gout   Essential hypertension   SOLITARY KIDNEY   Acute kidney injury superimposed on chronic kidney disease (HCC)   Asthma, chronic   Acute respiratory failure (HCC)     Acute respiratory failure with hypoxia and hypercapnia (HCC)/HAP/Asthma Groundglass nodule up to 1.5 cm - Concerns of bronchitis, cultures remain negative.  Respiratory panel negative.  She now remains on room air -Follow-up CT in 3-6 months.  -BNP slightly up, ProCal is neg. completed 5 days of oral prednisone   Acute kidney injury superimposed on chronic kidney disease ([place]) Solitary kidney -creatinine peaked at 2.7.  Now around baseline of 1.0  Drowsy - Resolved  2:1 high-grade AV conduction defect - Recently seen by cardiology.  AV nodal's blockers discontinued.  Has follow-up outpatient appointment with EP.  Zio patch.  Congestive heart failure with preserved ejection fraction - Echocardiogram showing EF of 65%.  Needs outpatient follow-up with cardiology.   Diabetes mellitus type 2,  insulin-dependent -Sliding scale and Accu-Chek.   Essential hypertension, uncontrolled - On Norvasc.  Restart lisinopril. Lasix and Aldactone on hold in the setting of AKI. - IV as needed   Gout -Stable  Hypomagnesemia-repletion   PT/OT-SNF  DVT prophylaxis: SQ Heparin Code Status: Full Code Family Communication:    Status is: Inpatient Remains inpatient appropriate because: Pending placement  Body mass index is 49.6 kg/m.  Pressure Injury 06/15/22 Buttocks Right Stage 2 -  Partial thickness loss of dermis presenting as a shallow open injury with a red, pink wound bed without slough. (Active)  06/15/22 1427  Location: Buttocks  Location Orientation: Right  Staging: Stage 2 -  Partial thickness loss of dermis presenting as a shallow open injury with a red, pink wound bed without slough.  Wound Description (Comments):   Present on Admission: Yes        Subjective: Doing well no complaints.  Examination: Constitutional: Not in acute distress.  Morbidly obese Respiratory: Clear to auscultation bilaterally Cardiovascular: Normal sinus rhythm, no rubs Abdomen: Nontender nondistended good bowel sounds Musculoskeletal: No edema noted Skin: No rashes seen Neurologic: CN 2-12 grossly intact.  And nonfocal Psychiatric: Normal judgment and insight. Alert and oriented x 3. Normal mood.  Objective: Vitals:   07/06/22 1954 07/06/22 2011 07/07/22 0404 07/07/22 0723  BP:  (!) 169/74 (!) 156/72   Pulse:  96 82   Resp:  17 16   Temp:  98.4 F (36.9 C) (!) 97.3 F (36.3 C)   TempSrc:  Oral Oral   SpO2: 96% 94% 95% 94%  Weight:      Height:        Intake/Output Summary (Last 24 hours) at  07/07/2022 1128 Last data filed at 07/06/2022 2100 Gross per 24 hour  Intake 240 ml  Output 1000 ml  Net -760 ml   Filed Weights   07/01/22 1746  Weight: (!) 139.4 kg     Data Reviewed:   CBC: Recent Labs  Lab 07/01/22 1711 07/02/22 0528 07/03/22 0514 07/04/22 0420  07/05/22 0505 07/06/22 0453 07/07/22 0447  WBC 10.6* 9.4 9.7 9.6 8.4 9.4 10.4  NEUTROABS 7.0 8.7*  --   --   --   --   --   HGB 12.4 10.9* 10.3* 10.8* 10.4* 11.1* 11.5*  HCT 41.7 37.4 34.8* 35.8* 33.7* 36.3 37.8  MCV 99.0 100.0 98.3 96.2 96.8 95.8 95.5  PLT 390 305 278 261 227 234 097   Basic Metabolic Panel: Recent Labs  Lab 07/03/22 0514 07/04/22 0420 07/05/22 0505 07/06/22 0453 07/07/22 0447  NA 140 142 144 143 139  K 4.1 4.3 4.3 4.0 3.5  CL 103 107 111 107 104  CO2 '28 26 27 29 27  '$ GLUCOSE 110* 126* 110* 92 77  BUN 32* 33* 31* 25* 21  CREATININE 1.92* 1.20* 1.02* 0.96 0.83  CALCIUM 8.2* 8.6* 8.4* 8.6* 8.4*  MG 2.2 2.5* 2.0 1.9 1.6*   GFR: Estimated Creatinine Clearance: 90.9 mL/min (by C-G formula based on SCr of 0.83 mg/dL). Liver Function Tests: Recent Labs  Lab 07/01/22 1711  AST 16  ALT 11  ALKPHOS 100  BILITOT 0.7  PROT 7.1  ALBUMIN 3.3*   No results for input(s): "LIPASE", "AMYLASE" in the last 168 hours. Recent Labs  Lab 07/01/22 1732 07/03/22 1149  AMMONIA 17 <10   Coagulation Profile: No results for input(s): "INR", "PROTIME" in the last 168 hours. Cardiac Enzymes: Recent Labs  Lab 07/02/22 0528  CKTOTAL 39   BNP (last 3 results) No results for input(s): "PROBNP" in the last 8760 hours. HbA1C: No results for input(s): "HGBA1C" in the last 72 hours. CBG: Recent Labs  Lab 07/06/22 1626 07/06/22 2007 07/06/22 2345 07/07/22 0401 07/07/22 0809  GLUCAP 187* 158* 132* 87 90   Lipid Profile: No results for input(s): "CHOL", "HDL", "LDLCALC", "TRIG", "CHOLHDL", "LDLDIRECT" in the last 72 hours. Thyroid Function Tests: No results for input(s): "TSH", "T4TOTAL", "FREET4", "T3FREE", "THYROIDAB" in the last 72 hours. Anemia Panel: No results for input(s): "VITAMINB12", "FOLATE", "FERRITIN", "TIBC", "IRON", "RETICCTPCT" in the last 72 hours. Sepsis Labs: Recent Labs  Lab 07/01/22 1711 07/02/22 0528  PROCALCITON  --  <0.10  LATICACIDVEN  1.2  --     Recent Results (from the past 240 hour(s))  Culture, blood (Routine X 2) w Reflex to ID Panel     Status: None   Collection Time: 07/01/22  5:11 PM   Specimen: BLOOD RIGHT ARM  Result Value Ref Range Status   Specimen Description   Final    BLOOD RIGHT ARM Performed at [facility], [place] [address], [place], [place] [address]    Special Requests   Final    BOTTLES DRAWN AEROBIC AND ANAEROBIC Blood Culture results may not be optimal due to an inadequate volume of blood received in culture bottles Performed at [facility] [address] [place], [place] [address]    Culture   Final    NO GROWTH 5 DAYS Performed at [facility], [place] [address], [place], [place] [address]    Report Status 07/06/2022 FINAL  Final  Resp panel by RT-PCR (RSV, Flu A&B, Covid) Anterior Nasal Swab  Status: None   Collection Time: 07/01/22  5:22 PM   Specimen: Anterior Nasal Swab  Result Value Ref Range Status   SARS Coronavirus 2 by RT PCR NEGATIVE NEGATIVE Final    Comment: (NOTE) SARS-CoV-2 target nucleic acids are NOT DETECTED.  The SARS-CoV-2 RNA is generally detectable in upper respiratory specimens during the acute phase of infection. The lowest concentration of SARS-CoV-2 viral copies this assay can detect is 138 copies/mL. A negative result does not preclude SARS-Cov-2 infection and should not be used as the sole basis for treatment or other patient management decisions. A negative result may occur with  improper specimen collection/handling, submission of specimen other than nasopharyngeal swab, presence of viral mutation(s) within the areas targeted by this assay, and inadequate number of viral copies(<138 copies/mL). A negative result must be combined with clinical observations, patient history, and epidemiological information. The expected result is Negative.  Fact Sheet for Patients:  EntrepreneurPulse.com.au  Fact Sheet  for Healthcare Providers:  IncredibleEmployment.be  This test is no t yet approved or cleared by the [place] FDA and  has been authorized for detection and/or diagnosis of SARS-CoV-2 by FDA under an Emergency Use Authorization (EUA). This EUA will remain  in effect (meaning this test can be used) for the duration of the COVID-19 declaration under Section 564(b)(1) of the Act, 21 U.S.C.section 360bbb-3(b)(1), unless the authorization is terminated  or revoked sooner.       Influenza A by PCR NEGATIVE NEGATIVE Final   Influenza B by PCR NEGATIVE NEGATIVE Final    Comment: (NOTE) The Xpert Xpress SARS-CoV-2/FLU/RSV plus assay is intended as an aid in the diagnosis of influenza from Nasopharyngeal swab specimens and should not be used as a sole basis for treatment. Nasal washings and aspirates are unacceptable for Xpert Xpress SARS-CoV-2/FLU/RSV testing.  Fact Sheet for Patients: EntrepreneurPulse.com.au  Fact Sheet for Healthcare Providers: IncredibleEmployment.be  This test is not yet approved or cleared by the [place] FDA and has been authorized for detection and/or diagnosis of SARS-CoV-2 by FDA under an Emergency Use Authorization (EUA). This EUA will remain in effect (meaning this test can be used) for the duration of the COVID-19 declaration under Section 564(b)(1) of the Act, 21 U.S.C. section 360bbb-3(b)(1), unless the authorization is terminated or revoked.     Resp Syncytial Virus by PCR NEGATIVE NEGATIVE Final    Comment: (NOTE) Fact Sheet for Patients: EntrepreneurPulse.com.au  Fact Sheet for Healthcare Providers: IncredibleEmployment.be  This test is not yet approved or cleared by the [place] FDA and has been authorized for detection and/or diagnosis of SARS-CoV-2 by FDA under an Emergency Use Authorization (EUA). This EUA will remain in effect (meaning  this test can be used) for the duration of the COVID-19 declaration under Section 564(b)(1) of the Act, 21 U.S.C. section 360bbb-3(b)(1), unless the authorization is terminated or revoked.  Performed at [facility], [place] [address], [place], [place] [address]   Culture, blood (Routine X 2) w Reflex to ID Panel     Status: None   Collection Time: 07/01/22  7:10 PM   Specimen: BLOOD  Result Value Ref Range Status   Specimen Description   Final    BLOOD SITE NOT SPECIFIED Performed at [facility] [address] [place], [place] [address]    Special Requests   Final    BOTTLES DRAWN AEROBIC AND ANAEROBIC Blood Culture adequate volume Performed at [facility] [address], [place], [place] [address]  Culture   Final    NO GROWTH 5 DAYS Performed at [facility], [place] [address], [place], [place] [address]    Report Status 07/06/2022 FINAL  Final  Respiratory (~20 pathogens) panel by PCR     Status: None   Collection Time: 07/02/22 10:41 AM   Specimen: Nasopharyngeal Swab; Respiratory  Result Value Ref Range Status   Adenovirus NOT DETECTED NOT DETECTED Final   Coronavirus 229E NOT DETECTED NOT DETECTED Final    Comment: (NOTE) The Coronavirus on the Respiratory Panel, DOES NOT test for the novel  Coronavirus (2019 nCoV)    Coronavirus HKU1 NOT DETECTED NOT DETECTED Final   Coronavirus NL63 NOT DETECTED NOT DETECTED Final   Coronavirus OC43 NOT DETECTED NOT DETECTED Final   Metapneumovirus NOT DETECTED NOT DETECTED Final   Rhinovirus / Enterovirus NOT DETECTED NOT DETECTED Final   Influenza A NOT DETECTED NOT DETECTED Final   Influenza B NOT DETECTED NOT DETECTED Final   Parainfluenza Virus 1 NOT DETECTED NOT DETECTED Final   Parainfluenza Virus 2 NOT DETECTED NOT DETECTED Final   Parainfluenza Virus 3 NOT DETECTED NOT DETECTED Final   Parainfluenza Virus 4 NOT DETECTED NOT DETECTED Final   Respiratory  Syncytial Virus NOT DETECTED NOT DETECTED Final   Bordetella pertussis NOT DETECTED NOT DETECTED Final   Bordetella Parapertussis NOT DETECTED NOT DETECTED Final   Chlamydophila pneumoniae NOT DETECTED NOT DETECTED Final   Mycoplasma pneumoniae NOT DETECTED NOT DETECTED Final    Comment: Performed at [facility], [place]. [address], [place],  [address]         Radiology Studies: No results found.   Scheduled Meds:  allopurinol  300 mg Oral Daily   amLODipine  5 mg Oral Daily   arformoterol  15 mcg Nebulization BID   docusate sodium  100 mg Oral BID   heparin  5,000 Units Subcutaneous Q8H   hydrALAZINE  100 mg Oral Q8H   insulin aspart  0-15 Units Subcutaneous Q4H   insulin detemir  12 Units Subcutaneous QHS   lisinopril  20 mg Oral Daily   mouth rinse  15 mL Mouth Rinse 4 times per day   polyethylene glycol  17 g Oral Daily   revefenacin  175 mcg Nebulization Daily   Continuous Infusions:     LOS: 6 days   Time spent= 35 mins  Nakshatra Klose Arsenio Loader, MD [facility]  If 7PM-7AM, please contact night-coverage  07/07/2022, 11:28 AM

## 2022-07-07 NOTE — Progress Notes (Signed)
Physical Therapy Treatment Patient Details Name: Jody Simpson MRN: [id number] DOB: 02/29/1951 Today's Date: 07/07/2022   History of Present Illness Patient is a 72 year old female who was brought in by EMS with confused. patient was admitted with Acute respiratory failure with hypoxia and hypercapnia,HAP,Asthma and AKI. PMH: extreme morbid obesity, SVT, essential hypertension, hyperlipidemia, GERD, type 2 diabetes. Patient recently admitted 1/14 to 1/25 with sepsis secondary to E. coli UTI, influenza, heart failure diastolic and physical deconditioning. Patient declined skilled nursing return to independent living center, respiratory failure, AKI.    PT Comments    Pt agreeable to mobilize.  Pt requiring increased assist for bed mobility and unable to stand today despite multiple attempts.  Pt performed a few LE exercises at EOB until fatigued.  Pt also with difficulty with lateral scooting.  Continue to recommend SNF upon d/c.    Recommendations for follow up therapy are one component of a multi-disciplinary discharge planning process, led by the attending physician.  Recommendations may be updated based on patient status, additional functional criteria and insurance authorization.  Follow Up Recommendations  Skilled nursing-short term rehab (<3 hours/day) Can patient physically be transported by private vehicle: No   Assistance Recommended at Discharge Frequent or constant Supervision/Assistance  Patient can return home with the following Two people to help with walking and/or transfers;Two people to help with bathing/dressing/bathroom;Assist for transportation;Help with stairs or ramp for entrance   Equipment Recommendations  None recommended by PT    Recommendations for Other Services       Precautions / Restrictions Precautions Precautions: Fall Restrictions Weight Bearing Restrictions: No     Mobility  Bed Mobility Overal bed mobility: Needs Assistance Bed Mobility:  Supine to Sit, Sit to Supine     Supine to sit: Mod assist Sit to supine: Mod assist   General bed mobility comments: assist to for trunk upright and scooting to EOB utilizing bed pad; pt not able to scoot up [place] and required assist mostly for lower body to return to supine    Transfers Overall transfer level: Needs assistance Equipment used: Rolling walker (2 wheels) Transfers: Sit to/from Stand Sit to Stand: Total assist           General transfer comment: Attempted to stand from elevated be surface with use of RW, pt unable to clear buttock from bed surface, attempted to use momentum and still unable; attempted x3    Ambulation/Gait                   Stairs             Wheelchair Mobility    Modified Rankin (Stroke Patients Only)       Balance                                            Cognition Arousal/Alertness: Awake/alert Behavior During Therapy: Flat affect Overall Cognitive Status: Within Functional Limits for tasks assessed                                 General Comments: did not ask orientation questions, pt following commands and has appropriate responses        Exercises General Exercises - Lower Extremity Ankle Circles/Pumps: AROM, Both, 20 reps Long Arc Quad: AROM, Both, 10 reps, Seated Hip Flexion/Marching: AROM,  Both, 5 reps, Seated    General Comments        Pertinent Vitals/Pain Pain Assessment Pain Assessment: No/denies pain    Home Living                          Prior Function            PT Goals (current goals can now be found in the care plan section) Progress towards PT goals: Progressing toward goals    Frequency    Min 2X/week      PT Plan Current plan remains appropriate    Co-evaluation              AM-PAC PT "6 Clicks" Mobility   Outcome Measure  Help needed turning from your back to your side while in a flat bed without using bedrails?:  Total Help needed moving from lying on your back to sitting on the side of a flat bed without using bedrails?: Total Help needed moving to and from a bed to a chair (including a wheelchair)?: Total Help needed standing up from a chair using your arms (e.g., wheelchair or bedside chair)?: Total Help needed to walk in hospital room?: Total Help needed climbing 3-5 steps with a railing? : Total 6 Click Score: 6    End of Session   Activity Tolerance: Patient limited by fatigue Patient left: in bed;with call bell/phone within reach Nurse Communication: Mobility status PT Visit Diagnosis: Muscle weakness (generalized) (M62.81);Difficulty in walking, not elsewhere classified (R26.2)     Time: 1000-1021 PT Time Calculation (min) (ACUTE ONLY): 21 min  Charges:  $Therapeutic Activity: 8-22 mins                     {Kati PT, DPT Physical Therapist Acute Rehabilitation Services Preferred contact method: Secure Chat Weekend Pager Only: [phone] Office: [place] 07/07/2022, 11:46 AM

## 2022-07-07 NOTE — Progress Notes (Signed)
RT note: BiPAP remains for PRN use.

## 2022-07-07 NOTE — TOC Progression Note (Addendum)
Transition of Care [facility]) - Progression Note    Patient Details  Name: Jody Simpson MRN: [id number] Date of Birth: 1950-10-08  Transition [facility] Care [facility] CM/SW Contact  Sharin Mons, RN Phone Number: 07/07/2022, 9:38 AM  Clinical Narrative:   Readmitted with Acute respiratory failure with hypoxia and hypercapnia,HAP,Asthma and AKI. From SNF REHAB/ [facility]. NCM received consult for possible SNF placement at time of discharge.NCM spoke  ith patient regarding PT's recommendation of SNF placement at time of discharge. Patient understands she is currently unable to care for self independently given her current physical needs and fall risk. Patient expressed understanding of PT recommendation and is agreeable to SNF placement at time of discharge. PTA  was  @ [facility] rehab  and would  like to go back there. States she  was there for 3 days before readmitting back into the hospital. NCM discussed insurance authorization process and provided Medicare SNF ratings list. Patient expressed being hopeful for rehab and to feel better soon. No further questions reported at this time. NCM to continue to follow and assist with discharge planning needs.   07/07/2022 @ 1049 NCM received call from Brittany/ [facility]'s admission liaison. Britanny informed NCM  she does have a SNF bed for pt. NCM shared with [place] initiated. Once approval receive NCM to make her aware.  07/07/2022 '@1650'$  SNF authorization/ [facility]  approved for 2/5 - 2/7 at sub acute level 1, next review date is 2/8, fax clinicals to [phone], concurrent nurse is Janett Billow, she can be reached at [phone]    Expected Discharge Plan: [place] Barriers to Discharge: Continued Medical Work up  Expected Discharge Plan and Services   Discharge Planning Services: CM Consult Post Acute Care Choice: [place] Living arrangements for the past 2 months: Apartment                                        Social Determinants of Health (SDOH) Interventions SDOH Screenings   Food Insecurity: No Food Insecurity (07/03/2022)  Housing: Low Risk  (07/03/2022)  Transportation Needs: No Transportation Needs (07/03/2022)  Utilities: Not At Risk (07/03/2022)  Tobacco Use: Low Risk  (07/01/2022)    Readmission Risk Interventions    07/02/2022    7:37 AM  Readmission Risk Prevention Plan  Transportation Screening Complete  PCP or Specialist Appt within 5-7 Days Complete  Home Care Screening Complete  Medication Review (RN CM) Complete

## 2022-07-07 NOTE — NC FL2 (Signed)
[place] LEVEL OF CARE FORM     IDENTIFICATION  Patient Name: Jody Simpson Birthdate: [birth year] Sex: female Admission Date (Current Location): 07/01/2022  [facility] and [place] Number:  Herbalist and Address:  [facility], [place] [address], [place], [place] [address]      Provider Number: [id number]  Attending Physician Name and Address:  Damita Lack, MD  Relative Name and Phone Number:       Current Level of Care: Hospital Recommended Level of Care: [place] Prior Approval Number:    Date Approved/Denied:   PASRR Number: [id number]  Discharge Plan: SNF    Current Diagnoses: Patient Active Problem List   Diagnosis Date Noted   Acute respiratory failure with hypoxia ([place]) 07/01/2022   Acute kidney injury superimposed on chronic kidney disease ([place]) 07/01/2022   Asthma, chronic 07/01/2022   Acute respiratory failure ([place]) 07/01/2022   Sepsis secondary to UTI ([place]) 06/15/2022   CAP (community acquired pneumonia) 07/03/2016   Obesity-BMI 51 07/03/2016   Fall-down at home x 48 hrs 07/03/2016   PAT (paroxysmal atrial tachycardia) 07/03/2016   COLONIC POLYPS 09/08/2007   Gout 09/08/2007   GASTROESOPHAGEAL REFLUX DISEASE 09/08/2007   IRRITABLE BOWEL SYNDROME 09/08/2007   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[place]) 07/28/2006   Hyperlipemia 07/28/2006   SOLITARY KIDNEY 07/28/2006   Diabetes mellitus, type 2 ([place]) 07/21/2006   Essential hypertension 07/21/2006   DERMATITIS, ATOPIC 07/21/2006   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    Orientation RESPIRATION BLADDER Height & Weight     Self, Time, Situation  Normal Incontinent, External catheter Weight: (!) 139.4 kg Height:  '5\' 6"'$  (167.6 cm)  BEHAVIORAL SYMPTOMS/MOOD NEUROLOGICAL BOWEL NUTRITION STATUS      Incontinent Diet (REFER TO D/C SUMMARY)  AMBULATORY STATUS COMMUNICATION OF NEEDS Skin   Extensive Assist Verbally Normal                        Personal Care Assistance Level of Assistance  Bathing, Feeding, Dressing Bathing Assistance: Maximum assistance Feeding assistance: Independent Dressing Assistance: Maximum assistance     Functional Limitations Info  Sight, Hearing, Speech Sight Info: Adequate Hearing Info: Adequate Speech Info: Adequate    SPECIAL CARE FACTORS FREQUENCY  PT (By licensed PT), OT (By licensed OT)   Diabetic Urine Testing Frequency: 5x/week, evaluate and treat PT Frequency: 5x/week, evaluate and treat OT Frequency: 5x/week, evaluate and treat            Contractures Contractures Info: Not present    Additional Factors Info  Allergies, Code Status Code Status Info: Full Code Allergies Info: Aspirin, Banana, Cholestyramine, Codeine, Diazepam, Hydrocodone, Ibuprofen, Iodine, Latex, Meperidine Hcl, Metformin, Naproxen Sodium, Penicillins, Pravastatin Sodium, Sitagliptin Phosphate, Tape   Insulin Sliding Scale Info: refer to d/c medication list       Current Medications (07/07/2022):  This is the current hospital active medication list Current Facility-Administered Medications  Medication Dose Route Frequency Provider Last Rate Last Admin   acetaminophen (TYLENOL) tablet 650 mg  650 mg Oral Q6H PRN Crosley, Debby, MD   650 mg at 07/06/22 2109   Or   acetaminophen (TYLENOL) suppository 650 mg  650 mg Rectal Q6H PRN Crosley, Debby, MD       allopurinol (ZYLOPRIM) tablet 300 mg  300 mg Oral Daily Crosley, Debby, MD   300 mg at 07/07/22 0917   ALPRAZolam (XANAX) tablet 0.5 mg  0.5 mg Oral BID  PRN Damita Lack, MD   0.5 mg at 07/07/22 0917   amLODipine (NORVASC) tablet 5 mg  5 mg Oral Daily Amin, Ankit Chirag, MD   5 mg at 07/07/22 0916   arformoterol (BROVANA) nebulizer solution 15 mcg  15 mcg Nebulization BID Amin, Ankit Chirag, MD   15 mcg at 07/07/22 4580   benzonatate (TESSALON) capsule 100 mg  100 mg Oral TID PRN Quintella Baton, MD       camphor-menthol (SARNA) lotion    Topical PRN Kathryne Eriksson, NP   Given at 07/07/22 0050   docusate sodium (COLACE) capsule 100 mg  100 mg Oral BID Amin, Ankit Chirag, MD   100 mg at 07/07/22 0917   guaiFENesin (ROBITUSSIN) 100 MG/5ML liquid 5 mL  5 mL Oral Q4H PRN Amin, Ankit Chirag, MD   5 mL at 07/07/22 0518   heparin injection 5,000 Units  5,000 Units Subcutaneous Q8H Crosley, Debby, MD   5,000 Units at 07/07/22 0524   hydrALAZINE (APRESOLINE) injection 10 mg  10 mg Intravenous Q4H PRN Amin, Ankit Chirag, MD       hydrALAZINE (APRESOLINE) tablet 100 mg  100 mg Oral Q8H Crosley, Debby, MD   100 mg at 07/07/22 0517   insulin aspart (novoLOG) injection 0-15 Units  0-15 Units Subcutaneous Q4H Crosley, Debby, MD   2 Units at 07/07/22 0050   insulin detemir (LEVEMIR) injection 12 Units  12 Units Subcutaneous QHS Crosley, Debby, MD   12 Units at 07/06/22 2120   ipratropium-albuterol (DUONEB) 0.5-2.5 (3) MG/3ML nebulizer solution 3 mL  3 mL Nebulization Q4H PRN Amin, Ankit Chirag, MD       lisinopril (ZESTRIL) tablet 20 mg  20 mg Oral Daily Amin, Ankit Chirag, MD   20 mg at 07/07/22 0917   magnesium sulfate IVPB 2 g 50 mL  2 g Intravenous Once Amin, Ankit Chirag, MD 50 mL/hr at 07/07/22 0922 2 g at 07/07/22 9983   metoprolol tartrate (LOPRESSOR) injection 5 mg  5 mg Intravenous Q4H PRN Amin, Ankit Chirag, MD       ondansetron (ZOFRAN) injection 4 mg  4 mg Intravenous Q6H PRN Amin, Ankit Chirag, MD   4 mg at 07/04/22 1241   Oral care mouth rinse  15 mL Mouth Rinse 4 times per day Quintella Baton, MD   15 mL at 07/07/22 0903   Oral care mouth rinse  15 mL Mouth Rinse PRN Crosley, Debby, MD       oxyCODONE (Oxy IR/ROXICODONE) immediate release tablet 5 mg  5 mg Oral Q4H PRN Amin, Ankit Chirag, MD   5 mg at 07/06/22 0753   polyethylene glycol (MIRALAX / GLYCOLAX) packet 17 g  17 g Oral Daily Amin, Ankit Chirag, MD   17 g at 07/07/22 3825   revefenacin (YUPELRI) nebulizer solution 175 mcg  175 mcg Nebulization Daily Amin, Ankit Chirag, MD    175 mcg at 07/07/22 0723   senna-docusate (Senokot-S) tablet 1 tablet  1 tablet Oral QHS PRN Damita Lack, MD         Discharge Medications: Please see discharge summary for a list of discharge medications.  Relevant Imaging Results:  Relevant Lab Results:   Additional Information SSN: [id number]  Sharin Mons, RN

## 2022-07-08 DIAGNOSIS — J9601 Acute respiratory failure with hypoxia: Secondary | ICD-10-CM | POA: Diagnosis not present

## 2022-07-08 LAB — BASIC METABOLIC PANEL
Anion gap: 8 (ref 5–15)
BUN: 18 mg/dL (ref 8–23)
CO2: 29 mmol/L (ref 22–32)
Calcium: 8.1 mg/dL — ABNORMAL LOW (ref 8.9–10.3)
Chloride: 102 mmol/L (ref 98–111)
Creatinine, Ser: 0.69 mg/dL (ref 0.44–1.00)
GFR, Estimated: >60 mL/min (ref >60)
Glucose, Bld: 115 mg/dL — ABNORMAL HIGH (ref 70–99)
Potassium: 3.7 mmol/L (ref 3.5–5.1)
Sodium: 139 mmol/L (ref 135–145)

## 2022-07-08 LAB — CBC
HCT: 33.8 % — ABNORMAL LOW (ref 36.0–46.0)
Hemoglobin: 10.4 g/dL — ABNORMAL LOW (ref 12.0–15.0)
MCH: 29.1 pg (ref 26.0–34.0)
MCHC: 30.8 g/dL (ref 30.0–36.0)
MCV: 94.7 fL (ref 80.0–100.0)
Platelets: 195 10*3/uL (ref 150–400)
RBC: 3.57 MIL/uL — ABNORMAL LOW (ref 3.87–5.11)
RDW: 15.4 % (ref 11.5–15.5)
WBC: 7.5 10*3/uL (ref 4.0–10.5)
nRBC: 0 % (ref 0.0–0.2)

## 2022-07-08 LAB — GLUCOSE, CAPILLARY
Glucose-Capillary: 108 mg/dL — ABNORMAL HIGH (ref 70–99)
Glucose-Capillary: 111 mg/dL — ABNORMAL HIGH (ref 70–99)
Glucose-Capillary: 181 mg/dL — ABNORMAL HIGH (ref 70–99)
Glucose-Capillary: 200 mg/dL — ABNORMAL HIGH (ref 70–99)

## 2022-07-08 LAB — MAGNESIUM: Magnesium: 1.9 mg/dL (ref 1.7–2.4)

## 2022-07-08 MED ORDER — DOCUSATE SODIUM 100 MG PO CAPS: 100.0000 mg | ORAL_CAPSULE | Freq: Two times a day (BID) | ORAL | 0 refills | Status: DC

## 2022-07-08 MED ORDER — POLYETHYLENE GLYCOL 3350 17 G PO PACK: 17.0000 g | PACK | Freq: Every day | ORAL | 0 refills | Status: DC | PRN

## 2022-07-08 MED ORDER — IPRATROPIUM-ALBUTEROL 0.5-2.5 (3) MG/3ML IN SOLN: 3.0000 mL | RESPIRATORY_TRACT | Status: DC | PRN

## 2022-07-08 MED ORDER — ALPRAZOLAM 0.5 MG PO TABS: 0.5000 mg | ORAL_TABLET | Freq: Two times a day (BID) | ORAL | 0 refills | Status: DC | PRN

## 2022-07-08 MED ORDER — OXYCODONE HCL 5 MG PO TABS: 5.0000 mg | ORAL_TABLET | Freq: Four times a day (QID) | ORAL | 0 refills | Status: DC | PRN

## 2022-07-08 NOTE — TOC Transition Note (Signed)
Transition of Care [facility]) - CM/SW Discharge Note   Patient Details  Name: Jody Simpson MRN: [id number] Date of Birth: 1950/08/14  [facility]) CM/SW Contact:  Angelita Ingles, RN Phone [phone]  07/08/2022, 10:44 AM   Clinical Narrative:    Discharge summary has been faxed to [place] has been arranged per PTAR. CM attempted to call brother Bonetta Mostek but no answer. Voicemail left will await return call. Discharge packet has been placed at nurses station. No other needs noted. TOC will sign off.   Please call report to  [facility] [phone] Room # 604A     Barriers to Discharge: Continued Medical Work up   Patient Goals and CMS Choice CMS Medicare.gov Compare Post Acute Care list provided to:: Patient Choice offered to / list presented to : Patient  Discharge Placement                         Discharge Plan and Services Additional resources added to the After Visit Summary for     Discharge Planning Services: CM Consult Post Acute Care Choice: [place]                               Social Determinants of Health (SDOH) Interventions SDOH Screenings   Food Insecurity: No Food Insecurity (07/03/2022)  Housing: Low Risk  (07/03/2022)  Transportation Needs: No Transportation Needs (07/03/2022)  Utilities: Not At Risk (07/03/2022)  Tobacco Use: Low Risk  (07/01/2022)     Readmission Risk Interventions    07/02/2022    7:37 AM  Readmission Risk Prevention Plan  Transportation Screening Complete  PCP or Specialist Appt within 5-7 Days Complete  Home Care Screening Complete  Medication Review (RN CM) Complete

## 2022-07-08 NOTE — TOC Progression Note (Addendum)
Transition of Care [facility]) - Progression Note    Patient Details  Name: Jody Simpson MRN: [id number] Date of Birth: [birth year]  Transition of Care [facility]) CM/SW [place], RN Phone [phone]  07/08/2022, 10:11 AM  Clinical Narrative:    TOC following for patient expected to discharge to [facility] today. CM has attempted to call [place] admissions coordinator for [facility]. There is no answer, message has been left. Will await return call for final approval.   1016 CM received return call from [place] who confirms that patient has insurance auth and can be admitted to the facility. CM awaiting discharge summary.    Expected Discharge Plan: [place] Barriers to Discharge: Continued Medical Work up  Expected Discharge Plan and Services   Discharge Planning Services: CM Consult Post Acute Care Choice: [place] Living arrangements for the past 2 months: Apartment Expected Discharge Date: 07/08/22                                     Social Determinants of Health (SDOH) Interventions SDOH Screenings   Food Insecurity: No Food Insecurity (07/03/2022)  Housing: Low Risk  (07/03/2022)  Transportation Needs: No Transportation Needs (07/03/2022)  Utilities: Not At Risk (07/03/2022)  Tobacco Use: Low Risk  (07/01/2022)    Readmission Risk Interventions    07/02/2022    7:37 AM  Readmission Risk Prevention Plan  Transportation Screening Complete  PCP or Specialist Appt within 5-7 Days Complete  Home Care Screening Complete  Medication Review (RN CM) Complete

## 2022-07-08 NOTE — Discharge Summary (Signed)
Physician Discharge Summary  Jody Simpson:[id number] DOB: 1951/05/27 DOA: 07/01/2022  PCP: Jinny Blossom, [facility]  Admit date: 07/01/2022 Discharge date: 07/08/2022  Admitted From: SNF Disposition: SNF  Recommendations for Outpatient Follow-up:  Follow up with PCP in 1-2 weeks Please obtain BMP/CBC in one week your next doctors visit.  Xanax twice daily as needed for anxiety.  Oxycodone for severe pain along with bowel regimen Bronchodilators as needed   Discharge Condition: Stable CODE STATUS: Full code Diet recommendation: Diabetic  Brief/Interim Summary: 72 year old female with past medical history of extreme morbid obesity, SVT, essential hypertension, hyperlipidemia, GERD, type 2 diabetes.  Patient recently admitted 1/14 to 1/25 with sepsis secondary to E. coli UTI, influenza, heart failure diastolic and physical deconditioning.  Patient declined skilled nursing return to independent living center.  In the ED patient initially thought to be septic secondary to community-acquired pneumonia but procalcitonin was negative therefore antibiotics were discontinued.  Patient was continued on prednisone and bronchodilators.  Also found to be in acute kidney injury requiring IV fluids.  Slowly improving.  Over the several days, renal function stabilized and returned back to normal.  She was weaned down to room air and doing well.  She had completed course of prednisone in the hospital.         Acute respiratory failure with hypoxia and hypercapnia suspect from bronchitis Groundglass nodule up to 1.5 cm - Concerns of bronchitis, cultures remain negative.  Respiratory panel negative.  She now remains on room air -Follow-up CT in 3-6 months.  -BNP slightly up, ProCal is neg. completed 5 days of oral prednisone   Acute kidney injury superimposed on chronic kidney disease ([place]) Solitary kidney -creatinine peaked at 2.7.  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  AV nodal's blockers discontinued.  Has follow-up outpatient appointment with EP.  Zio patch.  She has been advised to mail this back   Congestive heart failure with preserved ejection fraction - Echocardiogram showing EF of 65%.  Needs outpatient follow-up with cardiology.   Diabetes mellitus type 2, insulin-dependent -Resume home regimen.  Continue close monitoring of her blood glucose.   Essential hypertension, uncontrolled - Resume home medications.  Advised to monitor renal function closely.   Gout -Stable   Hypomagnesemia-repletion   PT/OT-SNF         Body mass index is 49.6 kg/m.  Pressure Injury 06/15/22 Buttocks Right Stage 2 -  Partial thickness loss of dermis presenting as a shallow open injury with a red, pink wound bed without slough. (Active)  06/15/22 1427  Location: Buttocks  Location Orientation: Right  Staging: Stage 2 -  Partial thickness loss of dermis presenting as a shallow open injury with a red, pink wound bed without slough.  Wound Description (Comments):   Present on Admission: Yes      Discharge Diagnoses:  Principal Problem:   Acute respiratory failure with hypoxia ([place]) Active Problems:   Diabetes mellitus, type 2 (HCC)   Gout   Essential hypertension   SOLITARY KIDNEY   Acute kidney injury superimposed on chronic kidney disease (HCC)   Asthma, chronic   Acute respiratory failure ([place])      Consultations: None  Subjective: Patient feels great, denies any shortness of breath, chest pain, lightheadedness, dizziness.  Feels back to her baseline  Discharge Exam: Vitals:   07/08/22 0839 07/08/22 0922  BP:  (!) 145/62  Pulse:  Resp:    Temp:    SpO2: 99%    Vitals:   07/07/22 2015 07/08/22 0546 07/08/22 0839 07/08/22 0922  BP: (!) 161/78 (!) 143/68  (!) 145/62  Pulse: 93 81    Resp: 18 20    Temp: 98.9 F (37.2 C) 97.7 F (36.5 C)    TempSrc:  Oral     SpO2: 95% 94% 99%   Weight:      Height:        General: Pt is alert, awake, not in acute distress, morbid obesity Cardiovascular: RRR, S1/S2 +, no rubs, no gallops Respiratory: CTA bilaterally, no wheezing, no rhonchi Abdominal: Soft, NT, ND, bowel sounds + Extremities: no edema, no cyanosis  Discharge Instructions   Allergies as of 07/08/2022       Reactions   Aspirin    REACTION: stomach irritation   Banana    Cholestyramine    REACTION: Diarrhea and nausea   Codeine    REACTION: stomach irritant   Diazepam    REACTION: hyper   Hydrocodone    Ibuprofen Other (See Comments)   Iodine    Latex    Meperidine Hcl    Metformin    REACTION: Diarrhea   Naproxen Sodium    REACTION: stomach irritant   Penicillins    Pravastatin Sodium    REACTION: Flu like symptoms   Sitagliptin Phosphate    REACTION: Caused elevated CBGs   Tape Rash   Tegaderm transparent plastic film        Medication List     STOP taking these medications    Fiasp FlexTouch 100 UNIT/ML FlexTouch Pen Generic drug: insulin aspart   insulin glargine-yfgn 100 UNIT/ML injection Commonly known as: SEMGLEE       TAKE these medications    acetaminophen 325 MG tablet Commonly known as: TYLENOL Take 650 mg by mouth every 4 (four) hours as needed for mild pain.   allopurinol 300 MG tablet Commonly known as: ZYLOPRIM Take 300 mg by mouth daily.   ALPRAZolam 0.5 MG tablet Commonly known as: XANAX Take 1 tablet (0.5 mg total) by mouth 2 (two) times daily as needed for anxiety.   amLODipine 5 MG tablet Commonly known as: NORVASC Take 5 mg by mouth daily.   baclofen 10 MG tablet Commonly known as: LIORESAL Take 10 mg by mouth daily as needed for muscle spasms.   benzonatate 100 MG capsule Commonly known as: TESSALON Take 100 mg by mouth 3 (three) times daily as needed for cough.   docusate sodium 100 MG capsule Commonly known as: COLACE Take 1 capsule (100 mg total) by mouth 2  (two) times daily.   fluticasone 50 MCG/ACT nasal spray Commonly known as: FLONASE Place 2 sprays into both nostrils daily as needed for allergies.   furosemide 20 MG tablet Commonly known as: Lasix Take 1 tablet (20 mg total) by mouth daily as needed for edema.   hydrALAZINE 100 MG tablet Commonly known as: APRESOLINE Take 1 tablet (100 mg total) by mouth every 8 (eight) hours.   insulin aspart 100 UNIT/ML injection Commonly known as: novoLOG Inject 6 Units into the skin 3 (three) times daily with meals.   insulin glargine 100 UNIT/ML Solostar Pen Commonly known as: LANTUS Inject 25 Units into the skin daily.   ipratropium-albuterol 0.5-2.5 (3) MG/3ML Soln Commonly known as: DUONEB Take 3 mLs by nebulization every 4 (four) hours as needed.   lisinopril 40 MG tablet Commonly known as: ZESTRIL Take 1  tablet (40 mg total) by mouth daily.   loratadine 10 MG tablet Commonly known as: CLARITIN Take 10 mg by mouth daily.   menthol-cetylpyridinium 3 MG lozenge Commonly known as: CEPACOL Take 1 lozenge by mouth every 4 (four) hours as needed for sore throat.   montelukast 10 MG tablet Commonly known as: SINGULAIR Take 10 mg by mouth at bedtime.   oxyCODONE 5 MG immediate release tablet Commonly known as: Oxy IR/ROXICODONE Take 1 tablet (5 mg total) by mouth every 6 (six) hours as needed for severe pain or breakthrough pain.   polyethylene glycol 17 g packet Commonly known as: MIRALAX / GLYCOLAX Take 17 g by mouth daily as needed.   spironolactone 25 MG tablet Commonly known as: ALDACTONE Take 1 tablet (25 mg total) by mouth daily.   Ventolin HFA 108 (90 Base) MCG/ACT inhaler Generic drug: albuterol Inhale 2 puffs into the lungs every 4 (four) hours as needed for wheezing or shortness of breath.        Follow-up Information     [facility] Follow up in 1 week(s).   Contact information: [address] [place] [address] [phone]          [facility] Follow up in 1 week(s).   Contact information: [place] [address] [phone]                Allergies  Allergen Reactions   Aspirin     REACTION: stomach irritation   Banana    Cholestyramine     REACTION: Diarrhea and nausea   Codeine     REACTION: stomach irritant   Diazepam     REACTION: hyper   Hydrocodone    Ibuprofen Other (See Comments)   Iodine    Latex    Meperidine Hcl    Metformin     REACTION: Diarrhea   Naproxen Sodium     REACTION: stomach irritant   Penicillins    Pravastatin Sodium     REACTION: Flu like symptoms   Sitagliptin Phosphate     REACTION: Caused elevated CBGs   Tape Rash    Tegaderm transparent plastic film    You were cared for by a hospitalist during your hospital stay. If you have any questions about your discharge medications or the care you received while you were in the hospital after you are discharged, you can call the unit and asked to speak with the hospitalist on call if the hospitalist that took care of you is not available. Once you are discharged, your primary care physician will handle any further medical issues. Please note that no refills for any discharge medications will be authorized once you are discharged, as it is imperative that you return to your primary care physician (or establish a relationship with a primary care physician if you do not have one) for your aftercare needs so that they can reassess your need for medications and monitor your lab values.   Procedures/Studies: DG Abd 1 View  Result Date: 07/04/2022 CLINICAL DATA:  Abdominal pain EXAM: ABDOMEN - 1 VIEW COMPARISON:  04/09/2010 FINDINGS: Nonobstructive bowel gas pattern. Mild stool burden. Surgical clips overlie the mid abdomen. Degenerative changes of the spine and SI joints. Bilateral hip osteoarthritis. IMPRESSION: No evidence of bowel obstruction.  Mild stool burden. Electronically Signed   By:  Maurine Simmering M.D.   On: 07/04/2022 11:41   CT Chest Wo Contrast  Result Date: 07/01/2022  CLINICAL DATA:  Altered mental status EXAM: CT CHEST WITHOUT CONTRAST TECHNIQUE: Multidetector CT imaging of the chest was performed following the standard protocol without IV contrast. RADIATION DOSE REDUCTION: This exam was performed according to the departmental dose-optimization program which includes automated exposure control, adjustment of the mA and/or kV according to patient size and/or use of iterative reconstruction technique. COMPARISON:  Radiograph 07/01/2022 FINDINGS: Cardiovascular: Cardiomegaly. No pericardial effusion. Aortic valve and mitral annular calcification. Coronary artery and aortic atherosclerotic calcification. Mediastinum/Nodes: Shotty mediastinal and hilar lymph nodes are favored reactive. The largest is a AP window node measuring 8 mm (3/49). Unremarkable esophagus. Trachea is patent. Lungs/Pleura: Respiratory motion obscures detail. Expiratory phase scan. Bibasilar atelectasis or infiltrates. Nodular predominantly ground-glass opacities in the anterior right upper lobe and lingula. The largest measures 1.5 cm (3/55) in the anterior right upper lobe. No pneumothorax or pleural effusion. Mosaic attenuation compatible with air trapping. Upper Abdomen: No acute abnormality. Musculoskeletal: No acute fracture. IMPRESSION: 1. Constellation of findings suggestive of bronchitis/reactive airways. Predominantly ground-glass nodules measuring up to 1.5 cm are favored infectious/inflammatory. Non-contrast chest CT at 3-6 months is recommended after treatment. If nodules persist, subsequent management will be based upon the most suspicious nodule(s). This recommendation follows the consensus statement: Guidelines for Management of Incidental Pulmonary Nodules Detected on CT Images: From the Fleischner Society 2017; Radiology 2017; 284:228-243. 2. Cardiomegaly and coronary artery calcification. Aortic  Atherosclerosis (ICD10-I70.0). Electronically Signed   By: Placido Sou M.D.   On: 07/01/2022 19:31   CT HEAD WO CONTRAST  Result Date: 07/01/2022 CLINICAL DATA:  Altered mental status EXAM: CT HEAD WITHOUT CONTRAST TECHNIQUE: Contiguous axial images were obtained from the base of the skull through the vertex without intravenous contrast. RADIATION DOSE REDUCTION: This exam was performed according to the departmental dose-optimization program which includes automated exposure control, adjustment of the mA and/or kV according to patient size and/or use of iterative reconstruction technique. COMPARISON:  06/15/2022 FINDINGS: Brain: No evidence of acute infarction, hemorrhage, hydrocephalus, extra-axial collection or mass lesion/mass effect. Subcortical white matter and periventricular small vessel ischemic changes. Vascular: Intracranial atherosclerosis. Skull: Normal. Negative for fracture or focal lesion. Sinuses/Orbits: The visualized paranasal sinuses are essentially clear. The mastoid air cells are unopacified. Other: None. IMPRESSION: No evidence of acute intracranial abnormality. Small vessel ischemic changes. Electronically Signed   By: Julian Hy M.D.   On: 07/01/2022 19:25   DG Chest Port 1 View  Result Date: 07/01/2022 CLINICAL DATA:  Acute onset of altered mental status. EXAM: PORTABLE CHEST 1 VIEW COMPARISON:  Radiographs 06/15/2022 and 07/04/2016. FINDINGS: 1741 hours. Patient rotation to the left. Stable mild cardiomegaly and aortic atherosclerosis. There is mildly increased opacity peripherally at the left lung base which could reflect an infiltrate and/or small pleural effusion. The right lung appears clear. No evidence of pneumothorax. Unchanged small calcifications in the left paratracheal region. No acute osseous findings are evident. There are degenerative changes in the spine. Telemetry leads overlie the chest. IMPRESSION: Mildly increased opacity peripherally at the left lung  base which could reflect an infiltrate and/or small pleural effusion. Electronically Signed   By: Richardean Sale M.D.   On: 07/01/2022 17:54   ECHOCARDIOGRAM COMPLETE  Result Date: 06/16/2022    ECHOCARDIOGRAM REPORT   Patient Name:   VERINA GALENO Dunk Date of Exam: 06/16/2022 Medical Rec #:  [id number]       Height:       67.0 in Accession #:    [id number]  Weight:       300.0 lb Date of Birth:  Nov 30, 1950       BSA:          2.403 m Patient Age:    72 years        BP:           151/68 mmHg Patient Gender: F               HR:           55 bpm. Exam Location:  Inpatient Procedure: 2D Echo, Color Doppler and Cardiac Doppler Indications:    elevated troponin  History:        Patient has no prior history of Echocardiogram examinations.                 Risk Factors:Diabetes, Hypertension and Dyslipidemia.  Sonographer:    Phineas Douglas Referring Phys: [id number] [place]  1. Left ventricular ejection fraction, by estimation, is 60 to 65%. The left ventricle has normal function. The left ventricle has no regional wall motion abnormalities. There is mild left ventricular hypertrophy. Left ventricular diastolic parameters are indeterminate.  2. Right ventricular systolic function is normal. The right ventricular size is normal. There is moderately elevated pulmonary artery systolic pressure.  3. Left atrial size was moderately dilated.  4. The mitral valve is degenerative. Mild mitral valve regurgitation. No evidence of mitral stenosis. Severe mitral annular calcification.  5. The aortic valve was not well visualized. There is moderate calcification of the aortic valve. There is moderate thickening of the aortic valve. Aortic valve regurgitation is not visualized. Mild aortic valve stenosis.  6. The inferior vena cava is normal in size with greater than 50% respiratory variability, suggesting right atrial pressure of 3 mmHg. FINDINGS  Left Ventricle: Left ventricular ejection fraction, by estimation, is  60 to 65%. The left ventricle has normal function. The left ventricle has no regional wall motion abnormalities. The left ventricular internal cavity size was normal in size. There is  mild left ventricular hypertrophy. Left ventricular diastolic parameters are indeterminate. Right Ventricle: The right ventricular size is normal. No increase in right ventricular wall thickness. Right ventricular systolic function is normal. There is moderately elevated pulmonary artery systolic pressure. The tricuspid regurgitant velocity is 3.12 m/s, and with an assumed right atrial pressure of 15 mmHg, the estimated right ventricular systolic pressure is 00.1 mmHg. Left Atrium: Left atrial size was moderately dilated. Right Atrium: Right atrial size was normal in size. Pericardium: There is no evidence of pericardial effusion. Mitral Valve: The mitral valve is degenerative in appearance. There is severe thickening of the mitral valve leaflet(s). There is severe calcification of the mitral valve leaflet(s). Severe mitral annular calcification. Mild mitral valve regurgitation. No evidence of mitral valve stenosis. Tricuspid Valve: The tricuspid valve is normal in structure. Tricuspid valve regurgitation is mild . No evidence of tricuspid stenosis. Aortic Valve: The aortic valve was not well visualized. There is moderate calcification of the aortic valve. There is moderate thickening of the aortic valve. Aortic valve regurgitation is not visualized. Mild aortic stenosis is present. Aortic valve mean gradient measures 12.5 mmHg. Aortic valve peak gradient measures 22.0 mmHg. Aortic valve area, by VTI measures 1.66 cm. Pulmonic Valve: The pulmonic valve was normal in structure. Pulmonic valve regurgitation is trivial. No evidence of pulmonic stenosis. Aorta: The aortic root is normal in size and structure. Venous: The inferior vena cava is normal in size with greater than 50%  respiratory variability, suggesting right atrial pressure  of 3 mmHg. IAS/Shunts: No atrial level shunt detected by color flow Doppler.  LEFT VENTRICLE PLAX 2D LVIDd:         5.20 cm      Diastology LVIDs:         3.20 cm      LV e' medial:    8.49 cm/s LV PW:         1.30 cm      LV E/e' medial:  25.0 LV IVS:        1.20 cm      LV e' lateral:   11.00 cm/s LVOT diam:     1.90 cm      LV E/e' lateral: 19.3 LV SV:         75 LV SV Index:   31 LVOT Area:     2.84 cm  LV Volumes (MOD) LV vol d, MOD A2C: 125.0 ml LV vol d, MOD A4C: 137.0 ml LV vol s, MOD A2C: 49.2 ml LV vol s, MOD A4C: 45.9 ml LV SV MOD A2C:     75.8 ml LV SV MOD A4C:     137.0 ml LV SV MOD BP:      85.5 ml RIGHT VENTRICLE          IVC RV Basal diam:  4.40 cm  IVC diam: 2.90 cm TAPSE (M-mode): 2.0 cm LEFT ATRIUM             Index        RIGHT ATRIUM           Index LA diam:        4.70 cm 1.96 cm/m   RA Area:     18.30 cm LA Vol (A2C):   70.4 ml 29.30 ml/m  RA Volume:   46.70 ml  19.44 ml/m LA Vol (A4C):   76.3 ml 31.76 ml/m LA Biplane Vol: 73.5 ml 30.59 ml/m  AORTIC VALVE                     PULMONIC VALVE AV Area (Vmax):    1.69 cm      PR End Diast Vel: 6.25 msec AV Area (Vmean):   1.79 cm AV Area (VTI):     1.66 cm AV Vmax:           234.75 cm/s AV Vmean:          163.500 cm/s AV VTI:            0.452 m AV Peak Grad:      22.0 mmHg AV Mean Grad:      12.5 mmHg LVOT Vmax:         140.00 cm/s LVOT Vmean:        103.000 cm/s LVOT VTI:          0.265 m LVOT/AV VTI ratio: 0.59  AORTA Ao Root diam: 3.00 cm Ao Asc diam:  3.20 cm MITRAL VALVE                TRICUSPID VALVE MV Area (PHT): 3.79 cm     TR Peak grad:   38.9 mmHg MV Decel Time: 200 msec     TR Vmax:        312.00 cm/s MR Peak grad: 118.8 mmHg MR Mean grad: 80.0 mmHg     SHUNTS MR Vmax:      545.00 cm/s   Systemic VTI:  0.26 m MR Vmean:  431.0 cm/s    Systemic Diam: 1.90 cm MV E velocity: 212.00 cm/s Jenkins Rouge MD Electronically signed by Jenkins Rouge MD Signature Date/Time: 06/16/2022/8:47:50 AM    Final    CT Head Wo Contrast  Result  Date: 06/15/2022 CLINICAL DATA:  Head trauma, minor (Age >= 65y) EXAM: CT HEAD WITHOUT CONTRAST TECHNIQUE: Contiguous axial images were obtained from the base of the skull through the vertex without intravenous contrast. RADIATION DOSE REDUCTION: This exam was performed according to the departmental dose-optimization program which includes automated exposure control, adjustment of the mA and/or kV according to patient size and/or use of iterative reconstruction technique. COMPARISON:  None Available. FINDINGS: Brain: No evidence of large-territorial acute infarction. No parenchymal hemorrhage. No mass lesion. No extra-axial collection. No mass effect or midline shift. No hydrocephalus. Basilar cisterns are patent. Vascular: No hyperdense vessel. Skull: No acute fracture or focal lesion. Sinuses/Orbits: Paranasal sinuses and mastoid air cells are clear. Left lens replacement. Otherwise the orbits are unremarkable. Other: None. IMPRESSION: No acute intracranial abnormality. Electronically Signed   By: Iven Finn M.D.   On: 06/15/2022 02:28   DG Chest Port 1 View  Result Date: 06/15/2022 CLINICAL DATA:  Questionable sepsis. EXAM: PORTABLE CHEST 1 VIEW COMPARISON:  AP Lat 07/04/2016. FINDINGS: The heart is moderately enlarged. There is mild central vascular prominence without overt edema. The lungs are clear of infiltrates. There mild chronic interstitial changes. The mediastinum is normally outlined. There is calcification in the aortic arch. Degenerative changes and slight dextroscoliosis thoracic spine. IMPRESSION: 1. Cardiomegaly with mild central vascular prominence but no overt edema. 2. No other evidence of acute chest disease.  Chronic change. Electronically Signed   By: Telford Nab M.D.   On: 06/15/2022 01:38   DG Pelvis Portable  Result Date: 06/15/2022 CLINICAL DATA:  Fall EXAM: PORTABLE PELVIS 1-2 VIEWS COMPARISON:  04/09/2010 FINDINGS: Symmetric degenerative changes in the hips. No acute  bony abnormality. Specifically, no fracture, subluxation, or dislocation. IMPRESSION: No acute bony abnormality. Electronically Signed   By: Rolm Baptise M.D.   On: 06/15/2022 01:35     The results of significant diagnostics from this hospitalization (including imaging, microbiology, ancillary and laboratory) are listed below for reference.     Microbiology: Recent Results (from the past 240 hour(s))  Culture, blood (Routine X 2) w Reflex to ID Panel     Status: None   Collection Time: 07/01/22  5:11 PM   Specimen: BLOOD RIGHT ARM  Result Value Ref Range Status   Specimen Description   Final    BLOOD RIGHT ARM Performed at [facility], [address] [place], [place] [address]    Special Requests   Final    BOTTLES DRAWN AEROBIC AND ANAEROBIC Blood Culture results may not be optimal due to an inadequate volume of blood received in culture bottles Performed at [place] [address] [place], [place] [address]    Culture   Final    NO GROWTH 5 DAYS Performed at [facility], [place] [address], [place], [place] [address]    Report Status 07/06/2022 FINAL  Final  Resp panel by RT-PCR (RSV, Flu A&B, Covid) Anterior Nasal Swab     Status: None   Collection Time: 07/01/22  5:22 PM   Specimen: Anterior Nasal Swab  Result Value Ref Range Status   SARS Coronavirus 2 by RT PCR NEGATIVE NEGATIVE Final    Comment: (NOTE) SARS-CoV-2 target nucleic acids are NOT DETECTED.  The SARS-CoV-2 RNA is generally detectable in upper respiratory specimens during the acute phase of infection. The lowest concentration of SARS-CoV-2 viral copies this assay can detect is 138 copies/mL. A negative result does not preclude SARS-Cov-2 infection and should not be used as the sole basis for treatment or other patient management decisions. A negative result may occur with  improper specimen collection/handling, submission of specimen other than nasopharyngeal swab, presence  of viral mutation(s) within the areas targeted by this assay, and inadequate number of viral copies(<138 copies/mL). A negative result must be combined with clinical observations, patient history, and epidemiological information. The expected result is Negative.  Fact Sheet for Patients:  EntrepreneurPulse.com.au  Fact Sheet for Healthcare Providers:  IncredibleEmployment.be  This test is no t yet approved or cleared by the [place] FDA and  has been authorized for detection and/or diagnosis of SARS-CoV-2 by FDA under an Emergency Use Authorization (EUA). This EUA will remain  in effect (meaning this test can be used) for the duration of the COVID-19 declaration under Section 564(b)(1) of the Act, 21 U.S.C.section 360bbb-3(b)(1), unless the authorization is terminated  or revoked sooner.       Influenza A by PCR NEGATIVE NEGATIVE Final   Influenza B by PCR NEGATIVE NEGATIVE Final    Comment: (NOTE) The Xpert Xpress SARS-CoV-2/FLU/RSV plus assay is intended as an aid in the diagnosis of influenza from Nasopharyngeal swab specimens and should not be used as a sole basis for treatment. Nasal washings and aspirates are unacceptable for Xpert Xpress SARS-CoV-2/FLU/RSV testing.  Fact Sheet for Patients: EntrepreneurPulse.com.au  Fact Sheet for Healthcare Providers: IncredibleEmployment.be  This test is not yet approved or cleared by the [place] FDA and has been authorized for detection and/or diagnosis of SARS-CoV-2 by FDA under an Emergency Use Authorization (EUA). This EUA will remain in effect (meaning this test can be used) for the duration of the COVID-19 declaration under Section 564(b)(1) of the Act, 21 U.S.C. section 360bbb-3(b)(1), unless the authorization is terminated or revoked.     Resp Syncytial Virus by PCR NEGATIVE NEGATIVE Final    Comment: (NOTE) Fact Sheet for  Patients: EntrepreneurPulse.com.au  Fact Sheet for Healthcare Providers: IncredibleEmployment.be  This test is not yet approved or cleared by the [place] FDA and has been authorized for detection and/or diagnosis of SARS-CoV-2 by FDA under an Emergency Use Authorization (EUA). This EUA will remain in effect (meaning this test can be used) for the duration of the COVID-19 declaration under Section 564(b)(1) of the Act, 21 U.S.C. section 360bbb-3(b)(1), unless the authorization is terminated or revoked.  Performed at [facility], [place] [address], [place], [place] [address]   Culture, blood (Routine X 2) w Reflex to ID Panel     Status: None   Collection Time: 07/01/22  7:10 PM   Specimen: BLOOD  Result Value Ref Range Status   Specimen Description   Final    BLOOD SITE NOT SPECIFIED Performed at [place] [address] [place], [place] [address]    Special Requests   Final    BOTTLES DRAWN AEROBIC AND ANAEROBIC Blood Culture adequate volume Performed at [place] [address] [place], [place] [address]    Culture   Final    NO GROWTH 5 DAYS Performed at [facility], [place] [address] [place], [place] [address]    Report Status 07/06/2022 FINAL  Final  Respiratory (~20 pathogens) panel by PCR  Status: None   Collection Time: 07/02/22 10:41 AM   Specimen: Nasopharyngeal Swab; Respiratory  Result Value Ref Range Status   Adenovirus NOT DETECTED NOT DETECTED Final   Coronavirus 229E NOT DETECTED NOT DETECTED Final    Comment: (NOTE) The Coronavirus on the Respiratory Panel, DOES NOT test for the novel  Coronavirus (2019 nCoV)    Coronavirus HKU1 NOT DETECTED NOT DETECTED Final   Coronavirus NL63 NOT DETECTED NOT DETECTED Final   Coronavirus OC43 NOT DETECTED NOT DETECTED Final   Metapneumovirus NOT DETECTED NOT DETECTED Final   Rhinovirus / Enterovirus NOT  DETECTED NOT DETECTED Final   Influenza A NOT DETECTED NOT DETECTED Final   Influenza B NOT DETECTED NOT DETECTED Final   Parainfluenza Virus 1 NOT DETECTED NOT DETECTED Final   Parainfluenza Virus 2 NOT DETECTED NOT DETECTED Final   Parainfluenza Virus 3 NOT DETECTED NOT DETECTED Final   Parainfluenza Virus 4 NOT DETECTED NOT DETECTED Final   Respiratory Syncytial Virus NOT DETECTED NOT DETECTED Final   Bordetella pertussis NOT DETECTED NOT DETECTED Final   Bordetella Parapertussis NOT DETECTED NOT DETECTED Final   Chlamydophila pneumoniae NOT DETECTED NOT DETECTED Final   Mycoplasma pneumoniae NOT DETECTED NOT DETECTED Final    Comment: Performed at [facility], [place]. [address] [place], [place] [address]     Labs: BNP (last 3 results) Recent Labs    06/15/22 0055 07/01/22 1741 07/02/22 0528  BNP 677.5* 400.6* 540.0*   Basic Metabolic Panel: Recent Labs  Lab 07/04/22 0420 07/05/22 0505 07/06/22 0453 07/07/22 0447 07/08/22 0443  NA 142 144 143 139 139  K 4.3 4.3 4.0 3.5 3.7  CL 107 111 107 104 102  CO2 '26 27 29 27 29  '$ GLUCOSE 126* 110* 92 77 115*  BUN 33* 31* 25* 21 18  CREATININE 1.20* 1.02* 0.96 0.83 0.69  CALCIUM 8.6* 8.4* 8.6* 8.4* 8.1*  MG 2.5* 2.0 1.9 1.6* 1.9   Liver Function Tests: Recent Labs  Lab 07/01/22 1711  AST 16  ALT 11  ALKPHOS 100  BILITOT 0.7  PROT 7.1  ALBUMIN 3.3*   No results for input(s): "LIPASE", "AMYLASE" in the last 168 hours. Recent Labs  Lab 07/01/22 1732 07/03/22 1149  AMMONIA 17 <10   CBC: Recent Labs  Lab 07/01/22 1711 07/02/22 0528 07/03/22 0514 07/04/22 0420 07/05/22 0505 07/06/22 0453 07/07/22 0447 07/08/22 0443  WBC 10.6* 9.4   < > 9.6 8.4 9.4 10.4 7.5  NEUTROABS 7.0 8.7*  --   --   --   --   --   --   HGB 12.4 10.9*   < > 10.8* 10.4* 11.1* 11.5* 10.4*  HCT 41.7 37.4   < > 35.8* 33.7* 36.3 37.8 33.8*  MCV 99.0 100.0   < > 96.2 96.8 95.8 95.5 94.7  PLT 390 305   < > 261 227 234 228 195   < > =  values in this interval not displayed.   Cardiac Enzymes: Recent Labs  Lab 07/02/22 0528  CKTOTAL 39   BNP: Invalid input(s): "POCBNP" CBG: Recent Labs  Lab 07/07/22 1607 07/07/22 2015 07/08/22 0033 07/08/22 0442 07/08/22 0752  GLUCAP 229* 232* 181* 111* 108*   D-Dimer No results for input(s): "DDIMER" in the last 72 hours. Hgb A1c No results for input(s): "HGBA1C" in the last 72 hours. Lipid Profile No results for input(s): "CHOL", "HDL", "LDLCALC", "TRIG", "CHOLHDL", "LDLDIRECT" in the last 72 hours. Thyroid function studies No results for input(s): "  TSH", "T4TOTAL", "T3FREE", "THYROIDAB" in the last 72 hours.  Invalid input(s): "FREET3" Anemia work up No results for input(s): "VITAMINB12", "FOLATE", "FERRITIN", "TIBC", "IRON", "RETICCTPCT" in the last 72 hours. Urinalysis    Component Value Date/Time   COLORURINE AMBER (A) 07/01/2022 1711   APPEARANCEUR CLOUDY (A) 07/01/2022 1711   LABSPEC 1.016 07/01/2022 1711   PHURINE 5.0 07/01/2022 1711   GLUCOSEU 50 (A) 07/01/2022 1711   HGBUR NEGATIVE 07/01/2022 1711   HGBUR negative 12/10/2006 1513   BILIRUBINUR NEGATIVE 07/01/2022 1711   KETONESUR NEGATIVE 07/01/2022 1711   PROTEINUR >=300 (A) 07/01/2022 1711   UROBILINOGEN 0.2 12/10/2006 1513   NITRITE NEGATIVE 07/01/2022 1711   LEUKOCYTESUR MODERATE (A) 07/01/2022 1711   Sepsis Labs Recent Labs  Lab 07/05/22 0505 07/06/22 0453 07/07/22 0447 07/08/22 0443  WBC 8.4 9.4 10.4 7.5   Microbiology Recent Results (from the past 240 hour(s))  Culture, blood (Routine X 2) w Reflex to ID Panel     Status: None   Collection Time: 07/01/22  5:11 PM   Specimen: BLOOD RIGHT ARM  Result Value Ref Range Status   Specimen Description   Final    BLOOD RIGHT ARM Performed at [facility], [place] [address], [place], [place] [address]    Special Requests   Final    BOTTLES DRAWN AEROBIC AND ANAEROBIC Blood Culture results may not be optimal due to an inadequate volume  of blood received in culture bottles Performed at [place] [address], [place], [place] [address]    Culture   Final    NO GROWTH 5 DAYS Performed at [facility], [place] [address] [place], [place] [address]    Report Status 07/06/2022 FINAL  Final  Resp panel by RT-PCR (RSV, Flu A&B, Covid) Anterior Nasal Swab     Status: None   Collection Time: 07/01/22  5:22 PM   Specimen: Anterior Nasal Swab  Result Value Ref Range Status   SARS Coronavirus 2 by RT PCR NEGATIVE NEGATIVE Final    Comment: (NOTE) SARS-CoV-2 target nucleic acids are NOT DETECTED.  The SARS-CoV-2 RNA is generally detectable in upper respiratory specimens during the acute phase of infection. The lowest concentration of SARS-CoV-2 viral copies this assay can detect is 138 copies/mL. A negative result does not preclude SARS-Cov-2 infection and should not be used as the sole basis for treatment or other patient management decisions. A negative result may occur with  improper specimen collection/handling, submission of specimen other than nasopharyngeal swab, presence of viral mutation(s) within the areas targeted by this assay, and inadequate number of viral copies(<138 copies/mL). A negative result must be combined with clinical observations, patient history, and epidemiological information. The expected result is Negative.  Fact Sheet for Patients:  EntrepreneurPulse.com.au  Fact Sheet for Healthcare Providers:  IncredibleEmployment.be  This test is no t yet approved or cleared by the [place] FDA and  has been authorized for detection and/or diagnosis of SARS-CoV-2 by FDA under an Emergency Use Authorization (EUA). This EUA will remain  in effect (meaning this test can be used) for the duration of the COVID-19 declaration under Section 564(b)(1) of the Act, 21 U.S.C.section 360bbb-3(b)(1), unless the authorization is terminated  or  revoked sooner.       Influenza A by PCR NEGATIVE NEGATIVE Final   Influenza B by PCR NEGATIVE NEGATIVE Final    Comment: (NOTE) The Xpert Xpress SARS-CoV-2/FLU/RSV plus assay is intended as an aid in the diagnosis of  influenza from Nasopharyngeal swab specimens and should not be used as a sole basis for treatment. Nasal washings and aspirates are unacceptable for Xpert Xpress SARS-CoV-2/FLU/RSV testing.  Fact Sheet for Patients: EntrepreneurPulse.com.au  Fact Sheet for Healthcare Providers: IncredibleEmployment.be  This test is not yet approved or cleared by the [place] FDA and has been authorized for detection and/or diagnosis of SARS-CoV-2 by FDA under an Emergency Use Authorization (EUA). This EUA will remain in effect (meaning this test can be used) for the duration of the COVID-19 declaration under Section 564(b)(1) of the Act, 21 U.S.C. section 360bbb-3(b)(1), unless the authorization is terminated or revoked.     Resp Syncytial Virus by PCR NEGATIVE NEGATIVE Final    Comment: (NOTE) Fact Sheet for Patients: EntrepreneurPulse.com.au  Fact Sheet for Healthcare Providers: IncredibleEmployment.be  This test is not yet approved or cleared by the [place] FDA and has been authorized for detection and/or diagnosis of SARS-CoV-2 by FDA under an Emergency Use Authorization (EUA). This EUA will remain in effect (meaning this test can be used) for the duration of the COVID-19 declaration under Section 564(b)(1) of the Act, 21 U.S.C. section 360bbb-3(b)(1), unless the authorization is terminated or revoked.  Performed at [facility], [place] [address], [place], [place] [address]   Culture, blood (Routine X 2) w Reflex to ID Panel     Status: None   Collection Time: 07/01/22  7:10 PM   Specimen: BLOOD  Result Value Ref Range Status   Specimen Description   Final    BLOOD  SITE NOT SPECIFIED Performed at [place] [address] [place], [place] [address]    Special Requests   Final    BOTTLES DRAWN AEROBIC AND ANAEROBIC Blood Culture adequate volume Performed at [place] [address], [place], [place] [address]    Culture   Final    NO GROWTH 5 DAYS Performed at [facility], [place] [address] [place], [place] [address]    Report Status 07/06/2022 FINAL  Final  Respiratory (~20 pathogens) panel by PCR     Status: None   Collection Time: 07/02/22 10:41 AM   Specimen: Nasopharyngeal Swab; Respiratory  Result Value Ref Range Status   Adenovirus NOT DETECTED NOT DETECTED Final   Coronavirus 229E NOT DETECTED NOT DETECTED Final    Comment: (NOTE) The Coronavirus on the Respiratory Panel, DOES NOT test for the novel  Coronavirus (2019 nCoV)    Coronavirus HKU1 NOT DETECTED NOT DETECTED Final   Coronavirus NL63 NOT DETECTED NOT DETECTED Final   Coronavirus OC43 NOT DETECTED NOT DETECTED Final   Metapneumovirus NOT DETECTED NOT DETECTED Final   Rhinovirus / Enterovirus NOT DETECTED NOT DETECTED Final   Influenza A NOT DETECTED NOT DETECTED Final   Influenza B NOT DETECTED NOT DETECTED Final   Parainfluenza Virus 1 NOT DETECTED NOT DETECTED Final   Parainfluenza Virus 2 NOT DETECTED NOT DETECTED Final   Parainfluenza Virus 3 NOT DETECTED NOT DETECTED Final   Parainfluenza Virus 4 NOT DETECTED NOT DETECTED Final   Respiratory Syncytial Virus NOT DETECTED NOT DETECTED Final   Bordetella pertussis NOT DETECTED NOT DETECTED Final   Bordetella Parapertussis NOT DETECTED NOT DETECTED Final   Chlamydophila pneumoniae NOT DETECTED NOT DETECTED Final   Mycoplasma pneumoniae NOT DETECTED NOT DETECTED Final    Comment: Performed at [facility], [place]. [address] [place], [place] [address]     Time coordinating discharge:  I have spent  35 minutes face to face with the patient and on the ward discussing  the patients care, assessment, plan and disposition with other care givers. >50% of the time was devoted counseling the patient about the risks and benefits of treatment/Discharge disposition and coordinating care.   SIGNED:   Damita Lack, MD  [facility] 07/08/2022, 10:39 AM   If 7PM-7AM, please contact night-coverage

## 2022-07-10 DIAGNOSIS — I10 Essential (primary) hypertension: Secondary | ICD-10-CM | POA: Diagnosis not present

## 2022-07-10 DIAGNOSIS — E1169 Type 2 diabetes mellitus with other specified complication: Secondary | ICD-10-CM

## 2022-07-10 DIAGNOSIS — R911 Solitary pulmonary nodule: Secondary | ICD-10-CM

## 2022-07-10 DIAGNOSIS — R531 Weakness: Secondary | ICD-10-CM

## 2022-07-16 NOTE — Progress Notes (Signed)
Cardiology Office Note:   Date:  07/21/2022  NAME:  Jody Simpson    MRN: [id number] DOB:  Nov 07, 1950   PCP:  Margretta Sidle, MD  Cardiologist:  None  Electrophysiologist:  None   Referring MD: Equiptment, [facility]*   Chief Complaint  Patient presents with   Follow-up        History of Present Illness:   Jody Simpson is a 72 y.o. female with a hx of AV block, HFpEF, DM who presents for follow-up.  She was readmitted to the hospital after her initial influenza illness.  She apparently had pneumonia after her influenza.  When seen in the hospital she did have evidence of 2-1 AV block.  This was brief.  She was on atenolol.  She was sick with influenza.  We had planned for an outpatient monitor.  She has not completed this.  She reports no chest pain.  No shortness of breath.  She is working with rehab.  She reports she has low energy.  She is diabetic.  She is working on her A1c.  Her cholesterol could be a bit better.  Her blood pressure is elevated.  Her EKG demonstrates sinus tachycardia with a likely first-degree AV block.  I reviewed her EKGs from the hospital.  There is mention of A-fib but I do not see this.  Problem List Second degree AV block, Mobitz 2 -06/2022 in setting of BB and covid 2. HFpEF 3. HTN 4. DM -A1c 8.2 5. 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   albuterol (VENTOLIN HFA) 108 (90 Base) MCG/ACT inhaler Inhale 2 puffs into the lungs every 4 (four) hours as needed for wheezing or shortness of breath.   allopurinol (ZYLOPRIM) 300 MG tablet Take 300 mg by mouth daily.   baclofen (LIORESAL) 10 MG tablet Take 10 mg by mouth daily as needed for muscle spasms.   docusate sodium (COLACE) 100 MG capsule Take 1 capsule (100 mg total) by mouth 2 (two) times daily.   fluticasone (FLONASE) 50 MCG/ACT nasal spray Place 2 sprays into both nostrils daily as needed for allergies.   furosemide (LASIX) 20 MG tablet Take 1 tablet (20 mg total) by mouth daily as needed for edema.   hydrALAZINE (APRESOLINE) 100 MG tablet Take 1 tablet (100 mg total) by mouth every 8 (eight) hours.   insulin  aspart (NOVOLOG) 100 UNIT/ML injection Inject 6 Units into the skin 3 (three) times daily with meals.   insulin glargine (LANTUS) 100 UNIT/ML Solostar Pen Inject 25 Units into the skin daily.   ipratropium-albuterol (DUONEB) 0.5-2.5 (3) MG/3ML SOLN Take 3 mLs by nebulization every 4 (four) hours as needed.   lisinopril (ZESTRIL) 40 MG tablet Take 1 tablet (40 mg total) by mouth daily.   loratadine (CLARITIN) 10 MG tablet Take 10 mg by mouth daily.   metoprolol succinate (TOPROL XL) 25 MG 24 hr tablet Take 1 tablet (25 mg total) by mouth daily.   montelukast (SINGULAIR) 10 MG tablet Take 10 mg by mouth at bedtime.   oxyCODONE (OXY IR/ROXICODONE) 5 MG immediate release tablet Take 1 tablet (5 mg total) by mouth every 6 (six) hours as needed for severe pain or breakthrough pain.   polyethylene glycol (MIRALAX / GLYCOLAX) 17 g packet Take 17 g by mouth  daily as needed.   spironolactone (ALDACTONE) 25 MG tablet Take 1 tablet (25 mg total) by mouth daily.   [DISCONTINUED] amLODipine (NORVASC) 5 MG tablet Take 5 mg by mouth daily.     Allergies:    Aspirin, Banana, Cholestyramine, Codeine, Diazepam, Hydrocodone, Ibuprofen, Iodine, Latex, Meperidine hcl, Metformin, Naproxen sodium, Penicillins, Pravastatin sodium, Sitagliptin phosphate, and Tape   Social History: Social History   Socioeconomic History   Marital status: Single    Spouse name: Not on file   Number of children: Not on file   Years of education: Not on file   Highest education level: Not on file  Occupational History   Not on file  Tobacco Use   Smoking status: Never   Smokeless tobacco: Never  Vaping Use   Vaping Use: Never used  Substance and Sexual Activity   Alcohol use: No   Drug use: No   Sexual activity: Never  Other Topics Concern   Not on file  Social History Narrative   Not on file   Social Determinants of Health   Financial Resource Strain: Not on file  Food Insecurity: No Food Insecurity (07/03/2022)   Hunger Vital Sign    Worried About Running Out of Food in the Last Year: Never true    Ran Out of Food in the Last Year: Never true  Transportation Needs: No Transportation Needs (07/03/2022)   PRAPARE - Hydrologist (Medical): No    Lack of Transportation (Non-Medical): No  Physical Activity: Not on file  Stress: Not on file  Social Connections: Not on file     Family History: The patient's family history includes Breast cancer in her mother; CAD in her father; Diabetes in her brother; Heart disease in her father.  ROS:   All other ROS reviewed and negative. Pertinent positives noted in the HPI.     EKGs/Labs/Other Studies Reviewed:   The following studies were personally reviewed by me today:  EKG:  EKG is ordered today.  The ekg ordered today demonstrates sinus tachycardia with likely first-degree AV block,  heart rate 112, regular rhythm, and was personally reviewed by me.   TTE 06/16/2022  1. Left ventricular ejection fraction, by estimation, is 60 to 65%. The  left ventricle has normal function. The left ventricle has no regional  wall motion abnormalities. There is mild left ventricular hypertrophy.  Left ventricular diastolic parameters  are indeterminate.   2. Right ventricular systolic function is normal. The right ventricular  size is normal. There is moderately elevated pulmonary  artery systolic  pressure.   3. Left atrial size was moderately dilated.   4. The mitral valve is degenerative. Mild mitral valve regurgitation. No  evidence of mitral stenosis. Severe mitral annular calcification.   5. The aortic valve was not well visualized. There is moderate  calcification of the aortic valve. There is moderate thickening of the  aortic valve. Aortic valve regurgitation is not visualized. Mild aortic  valve stenosis.   6. The inferior vena cava is normal in size with greater than 50%  respiratory variability, suggesting right atrial pressure of 3 mmHg.   Recent Labs: 07/01/2022: ALT 11 07/02/2022: B Natriuretic Peptide 293.0 07/08/2022: BUN 18; Creatinine, Ser 0.69; Hemoglobin 10.4; Magnesium 1.9; Platelets 195; Potassium 3.7; Sodium 139   Recent Lipid Panel    Component Value Date/Time   CHOL 140 06/17/2022 0438   TRIG 164 (H) 06/17/2022 0438   HDL 29 (L) 06/17/2022 0438   CHOLHDL 4.8 06/17/2022 0438   VLDL 33 06/17/2022 0438   LDLCALC 78 06/17/2022 0438   LDLDIRECT 124 (H) 03/22/2008 1046    Physical Exam:   VS:  BP (!) 172/72 (BP Location: Left Arm, Patient Position: Sitting, Cuff Size: Large) Comment (Patient Position): WHEELCHAIR  Pulse (!) 108   SpO2 94%    Wt Readings from Last 3 Encounters:  07/01/22 (!) 307 lb 4.8 oz (139.4 kg)  06/22/22 (!) 318 lb 12.6 oz (144.6 kg)  07/25/16 (!) 319 lb (144.7 kg)    General: Well nourished, well developed, in no acute  distress Head: Atraumatic, normal size  Eyes: PEERLA, EOMI  Neck: Supple, no JVD Endocrine: No thryomegaly Cardiac: Normal S1, S2; RRR; no murmurs, rubs, or gallops Lungs: Clear to auscultation bilaterally, no wheezing, rhonchi or rales  Abd: Soft, nontender, no hepatomegaly  Ext: No edema, pulses 2+ Musculoskeletal: No deformities, BUE and BLE strength normal and equal Skin: Warm and dry, no rashes   Neuro: Alert and oriented to person, place, time, and situation, CNII-XII grossly intact, no focal deficits  Psych: Normal mood and affect   ASSESSMENT:   ARLENYS KESLER is a 72 y.o. female who presents for the following: 1. Wenckebach second degree AV block   2. LBBB (left bundle branch block)   3. First degree AV block   4. Sinus tachycardia   5. Chronic diastolic heart failure ([place])   6. Primary hypertension     PLAN:   1. Wenckebach second degree AV block 2. LBBB (left bundle branch block) -Recent admission to the hospital in January with influenza.  Had a subsequent admission for pneumonia.  She had evidence of Wenckebach and intermittent left bundle branch block.  Also had a first-degree block.  She also had intermittent 2-1 block.  She was on atenolol and this was stopped. -No dizziness.  No lightheadedness.  She should complete her monitor.  This was not done.  She will complete it and follow-up in 3 months.  Her echo was normal. -See discussion.  EKG now sinus tachycardia versus SVT.  Would like to rechallenge her with a beta-blocker.  She will complete her heart monitor.  3. First degree AV block 4. Sinus tachycardia -EKG today appears to be sinus tachycardia with first-degree block.  She has a known history of first-degree block.  This is not represent atrial fibrillation or any arrhythmia from what I can tell.  Her heart rate is regular.  EKG could also represent SVT.  I would like to add metoprolol succinate 25 mg daily.  Will have her complete her monitor as well.  She  does have a history of 2 1 AV block.  Will need to be cautious with this.  He could have been worsened by influenza.   -Her echo was normal.  She should complete her  5. Chronic diastolic heart failure (HCC) -Euvolemic on hand.  Continue with Lasix as needed.  6. Primary hypertension -Current medications include amlodipine 5 mg daily, hydralazine 100 mg 3 times daily, lisinopril 40 mg daily, Aldactone 25 mg daily.  Increase amlodipine to 10 mg daily.  She may need to go back on a beta-blocker.  We will challenge her based on results of her monitor.  Disposition: Return in about 3 months (around 10/19/2022).  Medication Adjustments/Labs and Tests Ordered: Current medicines are reviewed at length with the patient today.  Concerns regarding medicines are outlined above.  Orders Placed This Encounter  Procedures   EKG 12-Lead   Meds ordered this encounter  Medications   amLODipine (NORVASC) 10 MG tablet    Sig: Take 1 tablet (10 mg total) by mouth daily.    Dispense:  90 tablet    Refill:  3   metoprolol succinate (TOPROL XL) 25 MG 24 hr tablet    Sig: Take 1 tablet (25 mg total) by mouth daily.    Dispense:  90 tablet    Refill:  3    Patient Instructions  Medication Instructions:   Increase Amlodipine 10 mg daily   *If you need a refill on your cardiac medications before your next appointment, please call your pharmacy*   Follow-Up: At [facility], you and your health needs are our priority.  As part of our continuing mission to provide you with exceptional heart care, we have created designated Provider Care Teams.  These Care Teams include your primary Cardiologist (physician) and Advanced Practice Providers (APPs -  Physician Assistants and Nurse Practitioners) who all work together to provide you with the care you need, when you need it.  We recommend signing up for the patient portal called "MyChart".  Sign up information is provided on this After Visit Summary.   MyChart is used to connect with patients for Virtual Visits (Telemedicine).  Patients are able to view lab/test results, encounter notes, upcoming appointments, etc.  Non-urgent messages can be sent to your provider as well.   To learn more about what you can do with MyChart, go to [email or web address].    Your next appointment:   3 month(s)  Provider:   Eleonore Chiquito, MD      Time Spent with Patient: I have spent a total of 35 minutes with patient reviewing hospital notes, telemetry, EKGs, labs and examining the patient as well as establishing an assessment and plan that was discussed with the patient.  > 50% of time was spent in direct patient care.  Signed, Addison Naegeli. Audie Box, MD, [place]  [address], [place], [place] [address] [phone]  07/21/2022 11:56 AM

## 2022-07-21 ENCOUNTER — Encounter: Payer: Self-pay | Admitting: Cardiovascular Disease

## 2022-07-21 ENCOUNTER — Ambulatory Visit: Payer: Medicare HMO | Attending: Cardiovascular Disease | Admitting: Cardiovascular Disease

## 2022-07-21 VITALS — BP 172/72 | HR 108

## 2022-07-21 DIAGNOSIS — R000 Tachycardia, unspecified: Secondary | ICD-10-CM

## 2022-07-21 DIAGNOSIS — I441 Atrioventricular block, second degree: Secondary | ICD-10-CM

## 2022-07-21 DIAGNOSIS — I440 Atrioventricular block, first degree: Secondary | ICD-10-CM | POA: Diagnosis not present

## 2022-07-21 DIAGNOSIS — I10 Essential (primary) hypertension: Secondary | ICD-10-CM

## 2022-07-21 DIAGNOSIS — I5032 Chronic diastolic (congestive) heart failure: Secondary | ICD-10-CM

## 2022-07-21 DIAGNOSIS — I447 Left bundle-branch block, unspecified: Secondary | ICD-10-CM | POA: Diagnosis not present

## 2022-07-21 DIAGNOSIS — R630 Anorexia: Secondary | ICD-10-CM | POA: Diagnosis not present

## 2022-07-21 DIAGNOSIS — R197 Diarrhea, unspecified: Secondary | ICD-10-CM | POA: Diagnosis not present

## 2022-07-21 MED ORDER — AMLODIPINE BESYLATE 10 MG PO TABS: 10.0000 mg | ORAL_TABLET | Freq: Every day | ORAL | 3 refills | Status: AC

## 2022-07-21 MED ORDER — METOPROLOL SUCCINATE ER 25 MG PO TB24: 25.0000 mg | ORAL_TABLET | Freq: Every day | ORAL | 3 refills | Status: DC

## 2022-07-21 NOTE — Patient Instructions (Signed)
Medication Instructions:   Increase Amlodipine 10 mg daily   *If you need a refill on your cardiac medications before your next appointment, please call your pharmacy*   Follow-Up: At [facility], you and your health needs are our priority.  As part of our continuing mission to provide you with exceptional heart care, we have created designated Provider Care Teams.  These Care Teams include your primary Cardiologist (physician) and Advanced Practice Providers (APPs -  Physician Assistants and Nurse Practitioners) who all work together to provide you with the care you need, when you need it.  We recommend signing up for the patient portal called "MyChart".  Sign up information is provided on this After Visit Summary.  MyChart is used to connect with patients for Virtual Visits (Telemedicine).  Patients are able to view lab/test results, encounter notes, upcoming appointments, etc.  Non-urgent messages can be sent to your provider as well.   To learn more about what you can do with MyChart, go to [email or web address].    Your next appointment:   3 month(s)  Provider:   Eleonore Chiquito, MD

## 2022-07-30 DIAGNOSIS — R2689 Other abnormalities of gait and mobility: Secondary | ICD-10-CM | POA: Diagnosis not present

## 2022-07-30 DIAGNOSIS — M6281 Muscle weakness (generalized): Secondary | ICD-10-CM | POA: Diagnosis not present

## 2022-09-05 DIAGNOSIS — Z7189 Other specified counseling: Secondary | ICD-10-CM | POA: Diagnosis not present

## 2022-09-05 DIAGNOSIS — I10 Essential (primary) hypertension: Secondary | ICD-10-CM | POA: Diagnosis not present

## 2022-09-10 ENCOUNTER — Telehealth: Payer: Self-pay | Admitting: Cardiovascular Disease

## 2022-09-10 NOTE — Telephone Encounter (Signed)
Nurse from facility where pt resides calling to states that pt is refusing to take   hydrALAZINE (APRESOLINE) 100 MG tablet  She also states that pt was ordered a Heart Monitor upon discharge from hospital. Nurse states that pt is allergic to adhesive, latex and tape. She would ike to know what other options are there. Nurse would like a callback regarding this matter as soon as possible. Please advise

## 2022-09-10 NOTE — Telephone Encounter (Signed)
The patients monitor was ordered back in January.  Dr. Flora Lipps specified in his note patient needs to complete this monitor.  Patient had first degree AV-Block, LBBB, and 2nd degree AV Block.   All monitors will have some type of adhesive.  [employer] does not offer a sensitive skin electrode.  They are Latex free.  Order was only for 7 days.  If applied and patient has a significant reaction, remove monitor and mail to [facility] asap for processing.  If Dr. Flora Lipps does not have enough data from that monitor, he can order an alternative.  Thanks,  Bridget Hartshorn to call pt but the phone just keeps ringing. No voicemail. Mychart message sent

## 2022-09-12 ENCOUNTER — Telehealth: Payer: Self-pay | Admitting: Cardiovascular Disease

## 2022-09-12 NOTE — Telephone Encounter (Signed)
Patient would like a call back to discuss medications. She states that she was in hospital and now a rehab facility and would like to discuss her medications and what it is that Dr. Flora Lipps has prescribed her. Please advise.

## 2022-09-12 NOTE — Telephone Encounter (Signed)
Spoke with patient and she was calling to discuss medications. I called facility and left voicemail for them to return call to office so I can get a list of patient's current medication list.

## 2022-09-16 NOTE — Telephone Encounter (Signed)
No voice mail set up, unable to LVM.

## 2022-09-16 NOTE — Telephone Encounter (Signed)
  Pt is returning call, she said, she will be not available between 10:30 -11:30 am

## 2022-09-17 DIAGNOSIS — I10 Essential (primary) hypertension: Secondary | ICD-10-CM

## 2022-09-17 DIAGNOSIS — E2689 Other hyperaldosteronism: Secondary | ICD-10-CM

## 2022-09-17 NOTE — Telephone Encounter (Signed)
Spoke with patient regarding medication.  States while at the ALF, NP was reviewing med list and found patient not taking two medications. Patient was not taking Amlodipine 10 mg nor the Toprol 25 mg.  Patient states she had never started the Toprol from her last visit.  It also shows at the last visit the Amlodipine was an increase to the 10 mg dose, but patient states she is not sure she every took the previous dose of 5 mg.  Patient states she was given a dose of the amlodipine while at the ALF.  She did not realize it was the BP medication but thought it was allopurinol due to the same size and color.  She states after taking the Amlodipine, she felt dizzy and unsteady.  She actually took it 2 days to see if the side effects would improve, but they did not.  She is no longer taking it.  She states she thinks her BP is low but cannot give any numbers.  Call to care and wellness at [facility].  LVM to please call to discuss the medications and to get BP readings to see if any medication changes are needed or if any new medications have been started while at [facility]

## 2022-09-19 DIAGNOSIS — R300 Dysuria: Secondary | ICD-10-CM

## 2022-09-19 DIAGNOSIS — N390 Urinary tract infection, site not specified: Secondary | ICD-10-CM | POA: Diagnosis not present

## 2022-09-19 DIAGNOSIS — Z7189 Other specified counseling: Secondary | ICD-10-CM | POA: Diagnosis not present

## 2022-09-19 DIAGNOSIS — M6281 Muscle weakness (generalized): Secondary | ICD-10-CM

## 2022-09-19 NOTE — Telephone Encounter (Signed)
Left two messages with Laurena Bering with no response.  Spoke with patient again today to discuss the medication.  She states she is still NOT taking Toprol and never did as no prescription.  She states she did take the amlodipine last night as the NP at the facility advised trying to take it in the evening.  She states she has seemed to do better today so will continue the evening dose of amlodipine.  Advised that if she speaks again to the NP, to let her know we have tried to contact the facility to discuss medications.  They can reach out to [place] at any time.

## 2022-10-19 NOTE — Progress Notes (Deleted)
Cardiology Office Note:   Date:  10/19/2022  NAME:  Jody Simpson    MRN: [id number] DOB:  [birth year]   PCP:  Lula Olszewski, MD  Cardiologist:  None  Electrophysiologist:  None   Referring MD: Lula Olszewski, MD   No chief complaint on file. ***  History of Present Illness:   Jody Simpson is a 72 y.o. female with a hx of Second degree AV block type 2, HFpEF, DM, HLD who presents for follow-up. Did not complete her monitor.   Problem List Second degree AV block, Mobitz 2 -06/2022 in setting of BB and covid 2. HFpEF 3. HTN 4. DM -A1c 8.2 5. 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     Allergies:    Aspirin, Banana, Cholestyramine, Codeine, Diazepam, Hydrocodone, Ibuprofen, Iodine, Latex, Meperidine hcl, Metformin, Naproxen sodium, Penicillins, Pravastatin sodium, Sitagliptin phosphate, and Tape   Social History: Social History   Socioeconomic History   Marital status: Single    Spouse name: Not on file   Number of children: Not on file   Years of  education: Not on file   Highest education level: Not on file  Occupational History   Not on file  Tobacco Use   Smoking status: Never   Smokeless tobacco: Never  Vaping Use   Vaping Use: Never used  Substance and Sexual Activity   Alcohol use: No   Drug use: No   Sexual activity: Never  Other Topics Concern   Not on file  Social History Narrative   Not on file   Social Determinants of Health   Financial Resource Strain: Not on file  Food Insecurity: No Food Insecurity (07/03/2022)   Hunger Vital Sign    Worried About Running Out of Food in the Last Year: Never true    Ran Out of Food in the Last Year: Never true  Transportation Needs: No Transportation Needs (07/03/2022)   PRAPARE - Administrator, Civil Service (Medical): No    Lack of Transportation (Non-Medical): No  Physical Activity: Not on file  Stress: Not on file  Social Connections: Not on file     Family History: The patient's ***family history includes Breast cancer in her mother; CAD in her father; Diabetes in her brother; Heart disease in her father.  ROS:   All other ROS reviewed and negative. Pertinent positives noted in the HPI.  EKGs/Labs/Other Studies Reviewed:   The following studies were personally reviewed by me today:  EKG:  EKG is *** ordered today.  The ekg ordered today demonstrates ***, and was personally reviewed by me.   Recent Labs: 07/01/2022: ALT 11 07/02/2022: B Natriuretic Peptide 293.0 07/08/2022: BUN 18; Creatinine, Ser 0.69; Hemoglobin 10.4; Magnesium 1.9; Platelets 195; Potassium 3.7; Sodium 139   Recent Lipid Panel    Component Value Date/Time   CHOL 140 06/17/2022 0438   TRIG 164 (H) 06/17/2022 0438   HDL 29 (L) 06/17/2022 0438   CHOLHDL 4.8 06/17/2022 0438   VLDL 33 06/17/2022 0438   LDLCALC 78 06/17/2022 0438   LDLDIRECT 124 (H) 03/22/2008 1046    Physical Exam:   VS:  There were no vitals taken for this visit.   Wt Readings from Last 3 Encounters:   07/01/22 (!) 307 lb 4.8 oz (139.4 kg)  06/22/22 (!) 318 lb 12.6 oz (144.6 kg)  07/25/16 (!) 319 lb (144.7 kg)    General: Well nourished, well developed, in no acute distress Head: Atraumatic, normal size  Eyes: PEERLA, EOMI  Neck: Supple, no JVD Endocrine: No thryomegaly Cardiac: Normal S1, S2; RRR; no murmurs, rubs, or gallops Lungs: Clear to auscultation bilaterally, no wheezing, rhonchi or rales  Abd: Soft, nontender, no hepatomegaly  Ext: No edema, pulses 2+ Musculoskeletal: No deformities, BUE and BLE strength normal and equal Skin: Warm and dry, no rashes   Neuro: Alert and oriented to person, place, time, and situation, CNII-XII grossly intact, no focal deficits  Psych: Normal mood and affect   ASSESSMENT:   Jody Simpson is a 72 y.o. female who presents for the following: No diagnosis found.  PLAN:   There are no diagnoses linked to this encounter.  {Are you ordering a CV Procedure (e.g. stress test, cath, DCCV, TEE, etc)?   Press F2        :[id number]}  Disposition: No follow-ups on file.  Medication Adjustments/Labs and Tests Ordered: Current medicines are reviewed at length with the patient today.  Concerns regarding medicines are outlined above.  No orders of the defined types were placed in this encounter.  No orders of the defined types were placed in this encounter.   There are no Patient Instructions on file for this visit.   Time Spent with Patient: I have spent a total of *** minutes with patient reviewing hospital notes, telemetry, EKGs, labs and examining the patient as well as establishing an assessment and plan that was discussed with the patient.  > 50% of time was spent in direct patient care.  Signed, Lenna Gilford. Flora Lipps, MD, [facility]  [facility]  [address], [address] [place], [place] [address] [phone]  10/19/2022 4:22 PM

## 2022-10-23 ENCOUNTER — Ambulatory Visit: Payer: Medicare HMO | Admitting: Cardiovascular Disease

## 2022-10-23 ENCOUNTER — Other Ambulatory Visit: Payer: Self-pay | Admitting: Geriatric Medicine

## 2022-10-23 DIAGNOSIS — I441 Atrioventricular block, second degree: Secondary | ICD-10-CM

## 2022-10-23 DIAGNOSIS — I5032 Chronic diastolic (congestive) heart failure: Secondary | ICD-10-CM

## 2022-10-23 DIAGNOSIS — I447 Left bundle-branch block, unspecified: Secondary | ICD-10-CM

## 2022-10-23 DIAGNOSIS — R911 Solitary pulmonary nodule: Secondary | ICD-10-CM

## 2022-10-23 DIAGNOSIS — I440 Atrioventricular block, first degree: Secondary | ICD-10-CM

## 2022-11-11 ENCOUNTER — Telehealth: Payer: Self-pay | Admitting: Cardiovascular Disease

## 2022-11-11 NOTE — Telephone Encounter (Signed)
STAT if HR is under 50 or over 120 (normal HR is 60-100 beats per minute)  What is your heart rate? 41-48 range today   Do you have a log of your heart rate readings (document readings)? No  Do you have any other symptoms? No but nurse is concerned since she'd never seen it this low.

## 2022-11-11 NOTE — Telephone Encounter (Signed)
Spoke with [employer] with [employer].Dr.O'Neal advised to hold Metoprolol until her appointment.She stated patient no longer takes Metoprolol.Advised to keep appointment as scheduled.Bring a list of medications.

## 2022-11-11 NOTE — Telephone Encounter (Signed)
Received a call from [place] with [facility].Stated today was her visit seeing patient.PCP ordered home health due to weeping legs.Stated she was calling to report patient's pulse 41 to 42.B/P 112/60.Patient complains of being tired.Stated patient was discharged from [facility]She noticed patient has 2 zio monitors in her bedroom.Monitors were never put on due to her skin allergies.Appointment scheduled with Edd Fabian NP 6/18 at 2:45 pm.Advised to bring a list of all medications.She will discuss with Verdon Cummins about wearing zio monitor.I will make Dr.O'Neal aware.

## 2022-11-12 ENCOUNTER — Encounter (HOSPITAL_COMMUNITY): Payer: Self-pay | Admitting: Internal Medicine

## 2022-11-12 ENCOUNTER — Emergency Department (HOSPITAL_COMMUNITY): Payer: Medicare HMO

## 2022-11-12 ENCOUNTER — Telehealth: Payer: Self-pay | Admitting: Cardiovascular Disease

## 2022-11-12 ENCOUNTER — Observation Stay (HOSPITAL_COMMUNITY): Payer: Medicare HMO

## 2022-11-12 ENCOUNTER — Observation Stay (HOSPITAL_COMMUNITY)
Admission: EM | Admit: 2022-11-12 | Discharge: 2022-11-25 | Disposition: A | Discharge status: Skilled Nursing Facility | Payer: Medicare HMO | Attending: Internal Medicine | Admitting: Internal Medicine

## 2022-11-12 ENCOUNTER — Other Ambulatory Visit: Payer: Self-pay

## 2022-11-12 DIAGNOSIS — J45909 Unspecified asthma, uncomplicated: Secondary | ICD-10-CM | POA: Diagnosis not present

## 2022-11-12 DIAGNOSIS — R0602 Shortness of breath: Secondary | ICD-10-CM | POA: Insufficient documentation

## 2022-11-12 DIAGNOSIS — I4439 Other atrioventricular block: Secondary | ICD-10-CM | POA: Insufficient documentation

## 2022-11-12 DIAGNOSIS — I10 Essential (primary) hypertension: Secondary | ICD-10-CM | POA: Insufficient documentation

## 2022-11-12 DIAGNOSIS — R001 Bradycardia, unspecified: Secondary | ICD-10-CM | POA: Diagnosis present

## 2022-11-12 DIAGNOSIS — R911 Solitary pulmonary nodule: Secondary | ICD-10-CM | POA: Diagnosis not present

## 2022-11-12 DIAGNOSIS — Z91040 Latex allergy status: Secondary | ICD-10-CM | POA: Diagnosis not present

## 2022-11-12 DIAGNOSIS — E785 Hyperlipidemia, unspecified: Secondary | ICD-10-CM | POA: Diagnosis present

## 2022-11-12 DIAGNOSIS — M799 Soft tissue disorder, unspecified: Secondary | ICD-10-CM | POA: Insufficient documentation

## 2022-11-12 DIAGNOSIS — S8001XA Contusion of right knee, initial encounter: Secondary | ICD-10-CM | POA: Diagnosis not present

## 2022-11-12 DIAGNOSIS — N179 Acute kidney failure, unspecified: Principal | ICD-10-CM | POA: Insufficient documentation

## 2022-11-12 DIAGNOSIS — Z794 Long term (current) use of insulin: Secondary | ICD-10-CM | POA: Insufficient documentation

## 2022-11-12 DIAGNOSIS — Z79899 Other long term (current) drug therapy: Secondary | ICD-10-CM | POA: Diagnosis not present

## 2022-11-12 DIAGNOSIS — S8000XA Contusion of unspecified knee, initial encounter: Secondary | ICD-10-CM | POA: Diagnosis present

## 2022-11-12 DIAGNOSIS — Z85528 Personal history of other malignant neoplasm of kidney: Secondary | ICD-10-CM | POA: Diagnosis not present

## 2022-11-12 DIAGNOSIS — W010XXA Fall on same level from slipping, tripping and stumbling without subsequent striking against object, initial encounter: Secondary | ICD-10-CM | POA: Insufficient documentation

## 2022-11-12 DIAGNOSIS — S83421A Sprain of lateral collateral ligament of right knee, initial encounter: Secondary | ICD-10-CM | POA: Diagnosis not present

## 2022-11-12 DIAGNOSIS — I4719 Other supraventricular tachycardia: Secondary | ICD-10-CM | POA: Diagnosis present

## 2022-11-12 DIAGNOSIS — E119 Type 2 diabetes mellitus without complications: Secondary | ICD-10-CM | POA: Diagnosis not present

## 2022-11-12 DIAGNOSIS — S8991XA Unspecified injury of right lower leg, initial encounter: Secondary | ICD-10-CM | POA: Diagnosis present

## 2022-11-12 DIAGNOSIS — M109 Gout, unspecified: Secondary | ICD-10-CM | POA: Diagnosis present

## 2022-11-12 DIAGNOSIS — M25561 Pain in right knee: Secondary | ICD-10-CM | POA: Diagnosis present

## 2022-11-12 LAB — CBC WITH DIFFERENTIAL/PLATELET
Abs Immature Granulocytes: 0.08 10*3/uL — ABNORMAL HIGH (ref 0.00–0.07)
Basophils Absolute: 0.1 10*3/uL (ref 0.0–0.1)
Basophils Relative: 1 %
Eosinophils Absolute: 0.4 10*3/uL (ref 0.0–0.5)
Eosinophils Relative: 3 %
HCT: 33.5 % — ABNORMAL LOW (ref 36.0–46.0)
Hemoglobin: 10.4 g/dL — ABNORMAL LOW (ref 12.0–15.0)
Immature Granulocytes: 1 %
Lymphocytes Relative: 8 %
Lymphs Abs: 1.1 10*3/uL (ref 0.7–4.0)
MCH: 30.6 pg (ref 26.0–34.0)
MCHC: 31 g/dL (ref 30.0–36.0)
MCV: 98.5 fL (ref 80.0–100.0)
Monocytes Absolute: 0.9 10*3/uL (ref 0.1–1.0)
Monocytes Relative: 7 %
Neutro Abs: 10.8 10*3/uL — ABNORMAL HIGH (ref 1.7–7.7)
Neutrophils Relative %: 80 %
Platelets: 288 10*3/uL (ref 150–400)
RBC: 3.4 MIL/uL — ABNORMAL LOW (ref 3.87–5.11)
RDW: 14.5 % (ref 11.5–15.5)
WBC: 13.3 10*3/uL — ABNORMAL HIGH (ref 4.0–10.5)
nRBC: 0 % (ref 0.0–0.2)

## 2022-11-12 LAB — BASIC METABOLIC PANEL
Anion gap: 9 (ref 5–15)
BUN: 20 mg/dL (ref 8–23)
CO2: 25 mmol/L (ref 22–32)
Calcium: 8.5 mg/dL — ABNORMAL LOW (ref 8.9–10.3)
Chloride: 106 mmol/L (ref 98–111)
Creatinine, Ser: 1.24 mg/dL — ABNORMAL HIGH (ref 0.44–1.00)
GFR, Estimated: 47 mL/min — ABNORMAL LOW (ref >60)
Glucose, Bld: 245 mg/dL — ABNORMAL HIGH (ref 70–99)
Potassium: 3.9 mmol/L (ref 3.5–5.1)
Sodium: 140 mmol/L (ref 135–145)

## 2022-11-12 LAB — URINALYSIS, ROUTINE W REFLEX MICROSCOPIC
Bilirubin Urine: NEGATIVE
Glucose, UA: 50 mg/dL — AB
Ketones, ur: NEGATIVE mg/dL
Nitrite: NEGATIVE
Protein, ur: 100 mg/dL — AB
Specific Gravity, Urine: 1.018 (ref 1.005–1.030)
pH: 5 (ref 5.0–8.0)

## 2022-11-12 LAB — GLUCOSE, CAPILLARY: Glucose-Capillary: 236 mg/dL — ABNORMAL HIGH (ref 70–99)

## 2022-11-12 MED ORDER — FLUTICASONE PROPIONATE 50 MCG/ACT NA SUSP: 2.0000 | Freq: Every day | NASAL | Status: DC | PRN

## 2022-11-12 MED ORDER — MONTELUKAST SODIUM 10 MG PO TABS
10.0000 mg | ORAL_TABLET | Freq: Every day | ORAL | Status: DC
  Administered 2022-11-12 – 2022-11-24 (×13): 10 mg via ORAL
  Filled 2022-11-12 (×13): qty 1

## 2022-11-12 MED ORDER — ACETAMINOPHEN 500 MG PO TABS
1000.0000 mg | ORAL_TABLET | Freq: Once | ORAL | Status: AC
  Administered 2022-11-12: 1000 mg via ORAL
  Filled 2022-11-12: qty 2

## 2022-11-12 MED ORDER — LISINOPRIL 20 MG PO TABS: 40.0000 mg | ORAL_TABLET | Freq: Every day | ORAL | Status: DC

## 2022-11-12 MED ORDER — SODIUM CHLORIDE 0.45 % IV SOLN: INTRAVENOUS | Status: DC

## 2022-11-12 MED ORDER — ACETAMINOPHEN 650 MG RE SUPP: 650.0000 mg | Freq: Four times a day (QID) | RECTAL | Status: DC | PRN

## 2022-11-12 MED ORDER — SODIUM CHLORIDE 0.9 % IV BOLUS
1000.0000 mL | Freq: Once | INTRAVENOUS | Status: AC
  Administered 2022-11-12: 1000 mL via INTRAVENOUS

## 2022-11-12 MED ORDER — AMLODIPINE BESYLATE 10 MG PO TABS
10.0000 mg | ORAL_TABLET | Freq: Every day | ORAL | Status: DC
  Administered 2022-11-12 – 2022-11-24 (×13): 10 mg via ORAL
  Filled 2022-11-12 (×14): qty 1

## 2022-11-12 MED ORDER — ALLOPURINOL 300 MG PO TABS
300.0000 mg | ORAL_TABLET | Freq: Every day | ORAL | Status: DC
  Administered 2022-11-12 – 2022-11-25 (×14): 300 mg via ORAL
  Filled 2022-11-12 (×14): qty 1

## 2022-11-12 MED ORDER — OXYCODONE HCL 5 MG PO TABS
5.0000 mg | ORAL_TABLET | ORAL | Status: DC | PRN
  Filled 2022-11-12: qty 1

## 2022-11-12 MED ORDER — BACLOFEN 10 MG PO TABS
10.0000 mg | ORAL_TABLET | Freq: Every day | ORAL | Status: DC | PRN
  Administered 2022-11-21: 10 mg via ORAL
  Filled 2022-11-12: qty 1

## 2022-11-12 MED ORDER — INSULIN ASPART 100 UNIT/ML IJ SOLN
0.0000 [IU] | Freq: Three times a day (TID) | INTRAMUSCULAR | Status: DC
  Administered 2022-11-13 – 2022-11-14 (×4): 4 [IU] via SUBCUTANEOUS
  Administered 2022-11-14 – 2022-11-16 (×6): 3 [IU] via SUBCUTANEOUS
  Administered 2022-11-16: 4 [IU] via SUBCUTANEOUS
  Administered 2022-11-17 (×2): 3 [IU] via SUBCUTANEOUS
  Administered 2022-11-18: 1 [IU] via SUBCUTANEOUS
  Administered 2022-11-18: 4 [IU] via SUBCUTANEOUS
  Administered 2022-11-18: 3 [IU] via SUBCUTANEOUS
  Administered 2022-11-19 (×2): 4 [IU] via SUBCUTANEOUS
  Administered 2022-11-19 – 2022-11-20 (×3): 3 [IU] via SUBCUTANEOUS
  Administered 2022-11-20: 4 [IU] via SUBCUTANEOUS
  Administered 2022-11-21 – 2022-11-22 (×2): 3 [IU] via SUBCUTANEOUS
  Administered 2022-11-24: 4 [IU] via SUBCUTANEOUS
  Administered 2022-11-25 (×2): 3 [IU] via SUBCUTANEOUS

## 2022-11-12 MED ORDER — SPIRONOLACTONE 25 MG PO TABS: 25.0000 mg | ORAL_TABLET | Freq: Every day | ORAL | Status: DC

## 2022-11-12 MED ORDER — LORATADINE 10 MG PO TABS
10.0000 mg | ORAL_TABLET | Freq: Every day | ORAL | Status: DC
  Administered 2022-11-13 – 2022-11-25 (×13): 10 mg via ORAL
  Filled 2022-11-12 (×14): qty 1

## 2022-11-12 MED ORDER — INSULIN GLARGINE-YFGN 100 UNIT/ML ~~LOC~~ SOLN
36.0000 [IU] | Freq: Every day | SUBCUTANEOUS | Status: DC
  Administered 2022-11-12 – 2022-11-22 (×10): 36 [IU] via SUBCUTANEOUS
  Filled 2022-11-12 (×14): qty 0.36

## 2022-11-12 MED ORDER — ACETAMINOPHEN 325 MG PO TABS
650.0000 mg | ORAL_TABLET | Freq: Four times a day (QID) | ORAL | Status: DC | PRN
  Administered 2022-11-12 – 2022-11-24 (×13): 650 mg via ORAL
  Filled 2022-11-12 (×13): qty 2

## 2022-11-12 NOTE — Telephone Encounter (Addendum)
Patient called stating she is currently in the emergency room, because she fell.  She is not sure if they are going to keep her overnight. She wants to know if there is anyway that she can be seen while she is in the hospital.  She currently has an appt with [place] for next week.

## 2022-11-12 NOTE — ED Triage Notes (Signed)
Patient brought in by EMS from [facility] due to an unwitnessed fall. Per patient she was transferring from her wheelchair to recliner missed and slid to a sitting position on the ground. She also states her right leg was twisted causing her right leg pain. Voices HX of neuropathy and bradycardia.  95% RA 54 CBG: 257

## 2022-11-12 NOTE — Telephone Encounter (Signed)
Advised patient that we cannot intervene while she is in the hospital. We will follow up with her once she is discharged.  She is wanting [place] to see her because "that monitor is going off again and they are supposed to monitor my BP". I advised they will monitor and take care of her while admitted and again we will F/U once she is discharged.  She states understanding.

## 2022-11-12 NOTE — ED Notes (Signed)
Attempted to ambulate pt with walker, pt attempted but was unable to stand up more than seconds.

## 2022-11-12 NOTE — H&P (Signed)
History and Physical    Patient: Jody Simpson:[id number] DOB: Jul 11, 1950 DOA: 11/12/2022 DOS: the patient was seen and examined on 11/12/2022 PCP: Theodis Shove, DO  Patient coming from: Home  Chief Complaint:  Chief Complaint  Patient presents with   Fall   HPI: Jody Simpson is a 72 y.o. female with medical history significant of childhood asthma, community-acquired pneumonia, type 2 diabetes, GERD, gout, hypokalemia, hyperlipidemia, hypertension, IBS, renal cell carcinoma, class III obesity, neuropathy, nontraumatic rhabdomyolysis, paroxysmal atrial tachycardia, sinus arrhythmia, history of falls who was brought to the emergency department from her independent living facility due to having a mechanical/accidental fall and injuring her right knee while she was trying to transfer from her wheelchair to the recliner.  Denied any kind of prodromal symptoms. No chest pain, palpitations, diaphoresis, PND, orthopnea or pitting edema of the lower extremities.  She denied fever, chills, rhinorrhea, sore throat, wheezing or hemoptysis.  .  No abdominal pain, nausea, emesis, melena or hematochezia.  No flank pain, dysuria, frequency or hematuria.  No polyuria, polydipsia, polyphagia or blurred vision.   Lab work: Her urinalysis was hazy with glucose 50 and protein 100 mg/dL.  There was trace leukocyte esterase and moderate hemoglobin.  21-50 WBC with rare bacteria microscopic examination.  CBC shows a white count of 13.3, hemoglobin 10.4 g/dL and platelets 811.  BMP showed glucose of 245, creatinine 1.24 and calcium 8.5 mg/dL.  BUN and the rest of the electrolytes were normal.  Her previous creatinine level earlier this year was 0.69 mg/dL.  Imaging: Complete 4 view of the right knee with no acute fracture or dislocation.  There is moderate tricompartmental degenerative changes of the knee.   ED course: Initial vital signs were temperature 97.8 F, pulse 50, respiration, BP 148/102 and O2  sat 95% on room air.  The patient received acetaminophen 1000 mg p.o. and NS 1000 mL bolus.  The patient failed a trial of ambulation in the emergency department.  Review of Systems: As mentioned in the history of present illness. All other systems reviewed and are negative.  Past Medical History:  Diagnosis Date   Asthma    as child   CAP (community acquired pneumonia)    Diabetes mellitus without complication (HCC)    Fall at home    Down x48 hours   GERD (gastroesophageal reflux disease)    Gout    Hyperkalemia    Mild   Hyperlipidemia    Hypertension    IBS (irritable bowel syndrome)    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)    Morbid obesity (HCC)    Neuropathy    Non-traumatic rhabdomyolysis    PAT (paroxysmal atrial tachycardia)    Sinus arrhythmia    Past Surgical History:  Procedure Laterality Date   ABDOMINAL HYSTERECTOMY  1997   ANKLE FUSION  2000   [facility]   kideny removal Left 1997   Renal Carcinoma cell   PARATHYROIDECTOMY  1998   Social History:  reports that she has never smoked. She has never used smokeless tobacco. She reports that she does not drink alcohol and does not use drugs.  Allergies  Allergen Reactions   Aspirin     REACTION: stomach irritation   Banana    Cholestyramine     REACTION: Diarrhea and nausea   Codeine     REACTION: stomach irritant   Diazepam     REACTION: hyper   Hydrocodone    Ibuprofen Other (See  Comments)   Iodine    Latex    Meperidine Hcl    Metformin     REACTION: Diarrhea   Naproxen Sodium     REACTION: stomach irritant   Penicillins    Pravastatin Sodium     REACTION: Flu like symptoms   Sitagliptin Phosphate     REACTION: Caused elevated CBGs   Tape Rash    Tegaderm transparent plastic film    Family History  Problem Relation Age of Onset   CAD Father        hx of CABG   Heart disease Father    Diabetes Brother    Breast cancer Mother     Prior to Admission  medications   Medication Sig Start Date End Date Taking? Authorizing Provider  acetaminophen (TYLENOL) 325 MG tablet Take 650 mg by mouth every 4 (four) hours as needed for mild pain.    [provider]  albuterol (VENTOLIN HFA) 108 (90 Base) MCG/ACT inhaler Inhale 2 puffs into the lungs every 4 (four) hours as needed for wheezing or shortness of breath.    [provider]  allopurinol (ZYLOPRIM) 300 MG tablet Take 300 mg by mouth daily. 04/11/16   [provider]  amLODipine (NORVASC) 10 MG tablet Take 1 tablet (10 mg total) by mouth daily. 07/21/22   O'NealRonnald Ramp, MD  baclofen (LIORESAL) 10 MG tablet Take 10 mg by mouth daily as needed for muscle spasms.    [provider]  docusate sodium (COLACE) 100 MG capsule Take 1 capsule (100 mg total) by mouth 2 (two) times daily. 07/08/22   Amin, Loura Halt, MD  fluticasone (FLONASE) 50 MCG/ACT nasal spray Place 2 sprays into both nostrils daily as needed for allergies.    [provider]  furosemide (LASIX) 20 MG tablet Take 1 tablet (20 mg total) by mouth daily as needed for edema. 06/24/22 06/24/23  [place], Eric J, DO  hydrALAZINE (APRESOLINE) 100 MG tablet Take 1 tablet (100 mg total) by mouth every 8 (eight) hours. 06/24/22 09/22/22  [place], Alvira Philips, DO  insulin aspart (NOVOLOG) 100 UNIT/ML injection Inject 6 Units into the skin 3 (three) times daily with meals. 06/26/22   Regalado, Belkys A, MD  insulin glargine (LANTUS) 100 UNIT/ML Solostar Pen Inject 25 Units into the skin daily. 06/27/22   Russella Dar, NP  ipratropium-albuterol (DUONEB) 0.5-2.5 (3) MG/3ML SOLN Take 3 mLs by nebulization every 4 (four) hours as needed. 07/08/22   Amin, Loura Halt, MD  lisinopril (ZESTRIL) 40 MG tablet Take 1 tablet (40 mg total) by mouth daily. 06/25/22 09/23/22  [place], Alvira Philips, DO  loratadine (CLARITIN) 10 MG tablet Take 10 mg by mouth daily.    [provider]  menthol-cetylpyridinium (CEPACOL) 3 MG  lozenge Take 1 lozenge by mouth every 4 (four) hours as needed for sore throat. Patient not taking: Reported on 07/21/2022    [provider]  montelukast (SINGULAIR) 10 MG tablet Take 10 mg by mouth at bedtime.    [provider]  oxyCODONE (OXY IR/ROXICODONE) 5 MG immediate release tablet Take 1 tablet (5 mg total) by mouth every 6 (six) hours as needed for severe pain or breakthrough pain. 07/08/22   Amin, Ankit Chirag, MD  polyethylene glycol (MIRALAX / GLYCOLAX) 17 g packet Take 17 g by mouth daily as needed. 07/08/22   Amin, Loura Halt, MD  spironolactone (ALDACTONE) 25 MG tablet Take 1 tablet (25 mg total) by mouth daily. 06/25/22 09/23/22  [place], Eric J, [place]    Physical Exam: Vitals:   11/12/22 0801 11/12/22 0802 11/12/22 0804 11/12/22 1100  BP:   (!) 148/102   Pulse:   (!) 50   Temp:   97.8 F (36.6 C) 97.8 F (36.6 C)  TempSrc:   Oral   SpO2: 95%  95%   Weight:  130.6 kg    Height:  5\' 6"  (1.676 m)     Physical Exam Vitals and nursing note reviewed.  Constitutional:      General: She is awake. She is not in acute distress.    Appearance: She is morbidly obese.  HENT:     Head: Normocephalic.     Nose: No rhinorrhea.     Mouth/Throat:     Mouth: Mucous membranes are moist.  Eyes:     General: No scleral icterus.    Pupils: Pupils are equal, round, and reactive to light.  Cardiovascular:     Rate and Rhythm: Normal rate and regular rhythm.  Pulmonary:     Effort: Pulmonary effort is normal.     Breath sounds: Normal breath sounds.  Abdominal:     General: Bowel sounds are normal.     Palpations: Abdomen is soft.  Musculoskeletal:     Cervical back: Neck supple.     Right lower leg: No edema.     Left lower leg: No edema.  Skin:    General: Skin is warm and dry.  Neurological:     General: No focal deficit present.     Mental Status: She is alert and oriented to person, place, and time.  Psychiatric:        Mood and Affect: Mood normal.         Behavior: Behavior normal. Behavior is cooperative.     Data Reviewed:  Results are pending, will review when available. 06/16/2022 transthoracic echocardiogram IMPRESSIONS:   1. Left ventricular ejection fraction, by estimation, is 60 to 65%. The  left ventricle has normal function. The left ventricle has no regional  wall motion abnormalities. There is mild left ventricular hypertrophy.  Left ventricular diastolic parameters  are indeterminate.   2. Right ventricular systolic function is normal. The right ventricular  size is normal. There is moderately elevated pulmonary artery systolic  pressure.   3. Left atrial size was moderately dilated.   4. The mitral valve is degenerative. Mild mitral valve regurgitation. No  evidence of mitral stenosis. Severe mitral annular calcification.   5. The aortic valve was not well visualized. There is moderate  calcification of the aortic valve. There is moderate thickening of the  aortic valve. Aortic valve regurgitation is not visualized. Mild aortic  valve stenosis.   6. The inferior vena cava is normal in size with greater than 50%  respiratory variability, suggesting right atrial pressure of 3 mmHg.   Assessment and Plan: Principal Problem:   AKI (acute kidney injury) (HCC) Observation/telemetry. Continue IV fluids. Hold ARB/ACE. Hold diuretic. Avoid hypotension. Avoid nephrotoxins. Monitor intake and output. Monitor renal function electrolytes.  Active Problems:   Right knee injury Analgesics as needed. Consult physical therapy for evaluation in AM.    Sinus bradycardia Has been having it recently at home. Does not seem to be medication side effect. She is supposed to follow-up next Tuesday for:   PAT (paroxysmal atrial tachycardia) Continue cardio monitoring. Will likely need ambulatory monitor. Cardiology consult requested.    Diabetes mellitus, type 2 (HCC) Carbohydrate modified diet. Continue Lantus 36 units  daily or formulary equivalent.    Hyperlipemia Currently not on therapy. Follow-up with PCP.    Gout Continue allopurinol 300 mg p.o. daily.    Essential hypertension Continue amlodipine 10 mg p.o. daily.    Class 3 obesity (HCC) Current BMI 46.48 kg/m. Lifestyle modifications. Follow-up with PCP and/or bariatric clinic.    Pulmonary nodule requiring follow-up Seen on imaging about 4-1/2 months ago. Since mobility is an issue, will obtain chest CT while in the hospital.       Advance Care Planning:   Code Status: Full Code   Consults: Sent message to the cardiology master for a.m. consult.  Family Communication:   Severity of Illness: The appropriate patient status for this patient is OBSERVATION. Observation status is judged to be reasonable and necessary in order to provide the required intensity of service to ensure the patient's safety. The patient's presenting symptoms, physical exam findings, and initial radiographic and laboratory data in the context of their medical condition is felt to place them at decreased risk for further clinical deterioration. Furthermore, it is anticipated that the patient will be medically stable for discharge from the hospital within 2 midnights of admission.   Author: Bobette Mo, MD 11/12/2022 12:54 PM  For on call review [email or web address].   This document was prepared using Dragon voice recognition software and may contain some unintended transcription errors.

## 2022-11-12 NOTE — ED Provider Notes (Signed)
[place] EMERGENCY DEPARTMENT AT [facility] Provider Note   CSN: [id number] Arrival date & time: 11/12/22  0865     History  Chief Complaint  Patient presents with   Marletta Lor    Jody Simpson is a 72 y.o. female.  The history is provided by the patient and medical records. No language interpreter was used.  Fall     72 year old morbidly obese female with significant history of diabetes, hypertension, asthma, brought here via EMS from a senior living facility for evaluation of a fall.  Patient report this morning she was in the process of transferring herself from a chair to her lift chair when she missed charge, and slid down to the ground.  She denies hitting her head or loss of consciousness but states that she had her right knee twisted in an awkward position that she was unable to stand up on her own.  When the staff arrived, they noticed that her knee was planted awkwardly.  She mention while at rest, her pain is minimal but with movement she does endorse sharp pain about her right knee.  She denies any hip pain or ankle pain denies any other injury.  She denies any precipitating symptoms prior to the fall.  She is currently in rehab after a bad bouts of pneumonia requiring prolonged hospitalization.  She also reports that her heart rate has been low and that has been ongoing issue for the past several months.  Her doctor is aware of it.  She is not on any beta-blocker medication and she denies any lightheadedness or dizziness.  Home Medications Prior to Admission medications   Medication Sig Start Date End Date Taking? Authorizing Provider  acetaminophen (TYLENOL) 325 MG tablet Take 650 mg by mouth every 4 (four) hours as needed for mild pain.    [provider]  albuterol (VENTOLIN HFA) 108 (90 Base) MCG/ACT inhaler Inhale 2 puffs into the lungs every 4 (four) hours as needed for wheezing or shortness of breath.    [provider]  allopurinol  (ZYLOPRIM) 300 MG tablet Take 300 mg by mouth daily. 04/11/16   [provider]  amLODipine (NORVASC) 10 MG tablet Take 1 tablet (10 mg total) by mouth daily. 07/21/22   O'NealRonnald Ramp, MD  baclofen (LIORESAL) 10 MG tablet Take 10 mg by mouth daily as needed for muscle spasms.    [provider]  docusate sodium (COLACE) 100 MG capsule Take 1 capsule (100 mg total) by mouth 2 (two) times daily. 07/08/22   Amin, Loura Halt, MD  fluticasone (FLONASE) 50 MCG/ACT nasal spray Place 2 sprays into both nostrils daily as needed for allergies.    [provider]  furosemide (LASIX) 20 MG tablet Take 1 tablet (20 mg total) by mouth daily as needed for edema. 06/24/22 06/24/23  [place], Eric J, DO  hydrALAZINE (APRESOLINE) 100 MG tablet Take 1 tablet (100 mg total) by mouth every 8 (eight) hours. 06/24/22 09/22/22  [place], Alvira Philips, DO  insulin aspart (NOVOLOG) 100 UNIT/ML injection Inject 6 Units into the skin 3 (three) times daily with meals. 06/26/22   Regalado, Belkys A, MD  insulin glargine (LANTUS) 100 UNIT/ML Solostar Pen Inject 25 Units into the skin daily. 06/27/22   Russella Dar, NP  ipratropium-albuterol (DUONEB) 0.5-2.5 (3) MG/3ML SOLN Take 3 mLs by nebulization every 4 (four) hours as needed. 07/08/22   Amin, Loura Halt, MD  lisinopril (ZESTRIL) 40 MG tablet Take 1 tablet (40 mg  total) by mouth daily. 06/25/22 09/23/22  [place], Alvira Philips, DO  loratadine (CLARITIN) 10 MG tablet Take 10 mg by mouth daily.    [provider]  menthol-cetylpyridinium (CEPACOL) 3 MG lozenge Take 1 lozenge by mouth every 4 (four) hours as needed for sore throat. Patient not taking: Reported on 07/21/2022    [provider]  montelukast (SINGULAIR) 10 MG tablet Take 10 mg by mouth at bedtime.    [provider]  oxyCODONE (OXY IR/ROXICODONE) 5 MG immediate release tablet Take 1 tablet (5 mg total) by mouth every 6 (six) hours as needed for severe pain or breakthrough  pain. 07/08/22   Amin, Ankit Chirag, MD  polyethylene glycol (MIRALAX / GLYCOLAX) 17 g packet Take 17 g by mouth daily as needed. 07/08/22   Amin, Loura Halt, MD  spironolactone (ALDACTONE) 25 MG tablet Take 1 tablet (25 mg total) by mouth daily. 06/25/22 09/23/22  [place], Eric J, DO      Allergies    Aspirin, Banana, Cholestyramine, Codeine, Diazepam, Hydrocodone, Ibuprofen, Iodine, Latex, Meperidine hcl, Metformin, Naproxen sodium, Penicillins, Pravastatin sodium, Sitagliptin phosphate, and Tape    Review of Systems   Review of Systems  All other systems reviewed and are negative.   Physical Exam Updated Vital Signs BP (!) 148/102 (BP Location: Left Arm)   Pulse (!) 50   Temp 97.8 F (36.6 C) (Oral)   Ht 5\' 6"  (1.676 m)   Wt 130.6 kg   SpO2 95%   BMI 46.48 kg/m  Physical Exam Vitals and nursing note reviewed.  Constitutional:      General: She is not in acute distress.    Appearance: She is well-developed. She is obese.  HENT:     Head: Atraumatic.  Eyes:     Conjunctiva/sclera: Conjunctivae normal.  Cardiovascular:     Rate and Rhythm: Normal rate and regular rhythm.  Pulmonary:     Effort: Pulmonary effort is normal.  Abdominal:     Palpations: Abdomen is soft.     Tenderness: There is no abdominal tenderness.  Musculoskeletal:        General: Tenderness (Right knee: Tenderness to lateral joint line on palpation.  increasing pain with flexion/extension.  patella is located.  no joint laxity or deformity noted.) present.     Cervical back: Neck supple.  Skin:    Findings: No rash.     Comments: Erythematous skin changes noted to bilateral lower extremities consistent with chronic venous stasis.  Left leg is wrapped with Ace wrap.  Neurological:     Mental Status: She is alert and oriented to person, place, and time.  Psychiatric:        Mood and Affect: Mood normal.     ED Results / Procedures / Treatments   Labs (all labs ordered are listed, but only abnormal  results are displayed) Labs Reviewed  BASIC METABOLIC PANEL - Abnormal; Notable for the following components:      Result Value   Glucose, Bld 245 (*)    Creatinine, Ser 1.24 (*)    Calcium 8.5 (*)    GFR, Estimated 47 (*)    All other components within normal limits  CBC WITH DIFFERENTIAL/PLATELET - Abnormal; Notable for the following components:   WBC 13.3 (*)    RBC 3.40 (*)    Hemoglobin 10.4 (*)    HCT 33.5 (*)    Neutro Abs 10.8 (*)    Abs Immature Granulocytes 0.08 (*)    All other components  within normal limits  URINALYSIS, ROUTINE W REFLEX MICROSCOPIC - Abnormal; Notable for the following components:   APPearance HAZY (*)    Glucose, UA 50 (*)    Hgb urine dipstick MODERATE (*)    Protein, ur 100 (*)    Leukocytes,Ua TRACE (*)    Bacteria, UA RARE (*)    All other components within normal limits    EKG None  Radiology DG Knee Complete 4 Views Right  Result Date: 11/12/2022 CLINICAL DATA:  Unwitnessed fall EXAM: RIGHT KNEE - COMPLETE 4 VIEW COMPARISON:  None Available. FINDINGS: No evidence of fracture, dislocation, or joint effusion. Moderate tricompartmental degenerative changes of the knee. Soft tissues are unremarkable. IMPRESSION: 1. No acute fracture or dislocation. 2. Moderate tricompartmental degenerative changes of the knee. Electronically Signed   By: Agustin Cree M.D.   On: 11/12/2022 09:22    Procedures Procedures    Medications Ordered in ED Medications  acetaminophen (TYLENOL) tablet 1,000 mg (1,000 mg Oral Given 11/12/22 0839)  sodium chloride 0.9 % bolus 1,000 mL (1,000 mLs Intravenous New Bag/Given 11/12/22 1015)    ED Course/ Medical Decision Making/ A&P                             Medical Decision Making Amount and/or Complexity of Data Reviewed Labs: ordered. Radiology: ordered.  Risk OTC drugs.   BP (!) 148/102 (BP Location: Left Arm)   Pulse (!) 50   Temp 97.8 F (36.6 C) (Oral)   Ht 5\' 6"  (1.676 m)   Wt 130.6 kg   SpO2 95%    BMI 46.48 kg/m   8:73 AM  72 year old morbidly obese female with significant history of diabetes, hypertension, asthma, brought here via EMS from a senior living facility for evaluation of a fall.  Patient report this morning she was in the process of transferring herself from a chair to her lift chair when she missed charge, and slid down to the ground.  She denies hitting her head or loss of consciousness but states that she had her right knee twisted in an awkward position that she was unable to stand up on her own.  When the staff arrived, they noticed that her knee was planted awkwardly.  She mention while at rest, her pain is minimal but with movement she does endorse sharp pain about her right knee.  She denies any hip pain or ankle pain denies any other injury.  She denies any precipitating symptoms prior to the fall.  She is currently in rehab after a bad bouts of pneumonia requiring prolonged hospitalization.  She also reports that her heart rate has been low and that has been ongoing issue for the past several months.  Her doctor is aware of it.  She is not on any beta-blocker medication and she denies any lightheadedness or dizziness.  On exam this is an obese female laying in bed appears to be in no acute discomfort.  Exam remarkable for tenderness to the right knee only no other reproducible tenderness noted on exam.  No midline spine tenderness.  She does have chronic venous stasis in her lower extremities bilaterally but this is not new.  She is mentating appropriately.  Vital signs notable for heart rate of 50 consistent with sinus bradycardia the patient has had and this is not new.  -Labs ordered, independently viewed and interpreted by me.  Labs remarkable for WBC 13.3.  Cr 1.24, IVF given. UA  with similar appearance to prior value. No UTI sxs. -The patient was maintained on a cardiac monitor.  I personally viewed and interpreted the cardiac monitored which showed an underlying rhythm  of: sinus brady -Imaging independently viewed and interpreted by me and I agree with radiologist's interpretation.  Result remarkable for R knee xray without acute fx/dislocation -This patient presents to the ED for concern of fall, this involves an extensive number of treatment options, and is a complaint that carries with it a high risk of complications and morbidity.  The differential diagnosis includes mechanical fall, dehydration, anemia, stroke, acs, uti -Co morbidities that complicate the patient evaluation includes DM, HTN -Treatment includes IVF, tylenol -Reevaluation of the patient after these medicines showed that the patient improved -PCP office notes or outside notes reviewed -Discussion with specialist attending DR. Adela Lank -Escalation to admission/observation considered: patient is apprehensive about going home as she is still having pain to R knee and having difficulty bearing weight or ambulate  12:54 PM Appreciate consultation from [facility] Dr. Robb Matar who agrees to admit pt for hydration due to AKI, pain control, and further assessment which may include PT/OT        Final Clinical Impression(s) / ED Diagnoses Final diagnoses:  AKI (acute kidney injury) (HCC)  Sprain of lateral collateral ligament of right knee, initial encounter    Rx / DC Orders ED Discharge Orders     None         Fayrene Helper, PA-C 11/12/22 1256    Melene Plan, DO 11/12/22 1430

## 2022-11-12 NOTE — Plan of Care (Signed)

## 2022-11-12 NOTE — ED Notes (Signed)
ED TO INPATIENT HANDOFF REPORT  ED Nurse Name and Phone #: [phone]  S Name/Age/Gender Jody Simpson 72 y.o. female Room/Bed: WA13/WA13  Code Status   Code Status: Full Code  Home/SNF/Other Skilled nursing facility Patient oriented to: self, place, time, and situation Is this baseline? Yes   Triage Complete: Triage complete  Chief Complaint AKI (acute kidney injury) [facility]) [N17.9]  Triage Note Patient brought in by EMS from [facility] due to an unwitnessed fall. Per patient she was transferring from her wheelchair to recliner missed and slid to a sitting position on the ground. She also states her right leg was twisted causing her right leg pain. Voices HX of neuropathy and bradycardia.  95% RA 54 CBG: 257   Allergies Allergies  Allergen Reactions   Aspirin     REACTION: stomach irritation   Banana    Cholestyramine     REACTION: Diarrhea and nausea   Codeine     REACTION: stomach irritant   Diazepam     REACTION: hyper   Hydrocodone    Ibuprofen Other (See Comments)   Iodine    Latex    Meperidine Hcl    Metformin     REACTION: Diarrhea   Naproxen Sodium     REACTION: stomach irritant   Penicillins    Pravastatin Sodium     REACTION: Flu like symptoms   Sitagliptin Phosphate     REACTION: Caused elevated CBGs   Tape Rash    Tegaderm transparent plastic film    Level of Care/Admitting Diagnosis ED Disposition     ED Disposition  Admit   Condition  --   Comment  Hospital Area: [facility] [[id number]]  Level of Care: Telemetry [5]  Admit to tele based on following criteria: Other see comments  Comments: FALL.  May place patient in observation at [facility] or [facility] if equivalent level of care is available:: No  Covid Evaluation: Asymptomatic - no recent exposure (last 10 days) testing not required  Diagnosis: AKI (acute kidney injury) [facility]) [[id number]]  Admitting Physician: Bobette Mo [[id number]]   Attending Physician: Bobette Mo [[id number]]          B Medical/Surgery History Past Medical History:  Diagnosis Date   Asthma    as child   CAP (community acquired pneumonia)    Diabetes mellitus without complication (HCC)    Fall at home    Down x48 hours   GERD (gastroesophageal reflux disease)    Gout    Hyperkalemia    Mild   Hyperlipidemia    Hypertension    IBS (irritable bowel syndrome)    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)    Morbid obesity (HCC)    Neuropathy    Non-traumatic rhabdomyolysis    PAT (paroxysmal atrial tachycardia)    Sinus arrhythmia    Past Surgical History:  Procedure Laterality Date   ABDOMINAL HYSTERECTOMY  1997   ANKLE FUSION  2000   [facility]   kideny removal Left 1997   Renal Carcinoma cell   PARATHYROIDECTOMY  1998     A IV Location/Drains/Wounds Patient Lines/Drains/Airways Status     Active Line/Drains/Airways     Name Placement date Placement time Site Days   Peripheral IV 11/12/22 20 G 1" Right Antecubital 11/12/22  1009  Antecubital  less than 1   External Urinary Catheter 06/20/22  0303  --  145   Pressure Injury 06/15/22 Buttocks Right Stage  2 -  Partial thickness loss of dermis presenting as a shallow open injury with a red, pink wound bed without slough. 06/15/22  1427  -- 150   Wound / Incision (Open or Dehisced) 06/15/22 Irritant Dermatitis (Moisture Associated Skin Damage) Thigh Anterior;Left;Proximal 06/15/22  1428  Thigh  150            Intake/Output Last 24 hours  Intake/Output Summary (Last 24 hours) at 11/12/2022 1444 Last data filed at 11/12/2022 1224 Gross per 24 hour  Intake 999 ml  Output --  Net 999 ml    Labs/Imaging Results for orders placed or performed during the hospital encounter of 11/12/22 (from the past 48 hour(s))  Basic metabolic panel     Status: Abnormal   Collection Time: 11/12/22  8:42 AM  Result Value Ref Range   Sodium 140 135 - 145 mmol/L    Potassium 3.9 3.5 - 5.1 mmol/L   Chloride 106 98 - 111 mmol/L   CO2 25 22 - 32 mmol/L   Glucose, Bld 245 (H) 70 - 99 mg/dL    Comment: Glucose reference range applies only to samples taken after fasting for at least 8 hours.   BUN 20 8 - 23 mg/dL   Creatinine, Ser 1.61 (H) 0.44 - 1.00 mg/dL   Calcium 8.5 (L) 8.9 - 10.3 mg/dL   GFR, Estimated 47 (L) >60 mL/min    Comment: (NOTE) Calculated using the CKD-EPI Creatinine Equation (2021)    Anion gap 9 5 - 15    Comment: Performed at [facility], [address] [place], [place] [address]  CBC with Differential     Status: Abnormal   Collection Time: 11/12/22  8:42 AM  Result Value Ref Range   WBC 13.3 (H) 4.0 - 10.5 K/uL   RBC 3.40 (L) 3.87 - 5.11 MIL/uL   Hemoglobin 10.4 (L) 12.0 - 15.0 g/dL   HCT 54.0 (L) 98.1 - 19.1 %   MCV 98.5 80.0 - 100.0 fL   MCH 30.6 26.0 - 34.0 pg   MCHC 31.0 30.0 - 36.0 g/dL   RDW 47.8 29.5 - 62.1 %   Platelets 288 150 - 400 K/uL   nRBC 0.0 0.0 - 0.2 %   Neutrophils Relative % 80 %   Neutro Abs 10.8 (H) 1.7 - 7.7 K/uL   Lymphocytes Relative 8 %   Lymphs Abs 1.1 0.7 - 4.0 K/uL   Monocytes Relative 7 %   Monocytes Absolute 0.9 0.1 - 1.0 K/uL   Eosinophils Relative 3 %   Eosinophils Absolute 0.4 0.0 - 0.5 K/uL   Basophils Relative 1 %   Basophils Absolute 0.1 0.0 - 0.1 K/uL   Immature Granulocytes 1 %   Abs Immature Granulocytes 0.08 (H) 0.00 - 0.07 K/uL    Comment: Performed at [facility], [address] [place], [place] [address]  Urinalysis, Routine w reflex microscopic -Urine, Clean Catch     Status: Abnormal   Collection Time: 11/12/22 10:52 AM  Result Value Ref Range   Color, Urine YELLOW YELLOW   APPearance HAZY (A) CLEAR   Specific Gravity, Urine 1.018 1.005 - 1.030   pH 5.0 5.0 - 8.0   Glucose, UA 50 (A) NEGATIVE mg/dL   Hgb urine dipstick MODERATE (A) NEGATIVE   Bilirubin Urine NEGATIVE NEGATIVE   Ketones, ur NEGATIVE NEGATIVE mg/dL    Protein, ur 784 (A) NEGATIVE mg/dL   Nitrite NEGATIVE NEGATIVE   Leukocytes,Ua TRACE (A) NEGATIVE  RBC / HPF 21-50 0 - 5 RBC/hpf   WBC, UA 21-50 0 - 5 WBC/hpf   Bacteria, UA RARE (A) NONE SEEN   Squamous Epithelial / HPF 0-5 0 - 5 /HPF   Mucus PRESENT    Hyaline Casts, UA PRESENT     Comment: Performed at [facility], [address] [place], [place] [address]   DG Knee Complete 4 Views Right  Result Date: 11/12/2022 CLINICAL DATA:  Unwitnessed fall EXAM: RIGHT KNEE - COMPLETE 4 VIEW COMPARISON:  None Available. FINDINGS: No evidence of fracture, dislocation, or joint effusion. Moderate tricompartmental degenerative changes of the knee. Soft tissues are unremarkable. IMPRESSION: 1. No acute fracture or dislocation. 2. Moderate tricompartmental degenerative changes of the knee. Electronically Signed   By: Agustin Cree M.D.   On: 11/12/2022 09:22    Pending Labs Unresulted Labs (From admission, onward)     Start     Ordered   11/13/22 0500  CBC  Tomorrow morning,   R        11/12/22 1301   11/13/22 0500  Comprehensive metabolic panel  Tomorrow morning,   R        11/12/22 1301            Vitals/Pain Today's Vitals   11/12/22 0802 11/12/22 0804 11/12/22 0941 11/12/22 1100  BP:  (!) 148/102    Pulse:  (!) 50    Temp:  97.8 F (36.6 C)  97.8 F (36.6 C)  TempSrc:  Oral    SpO2:  95%    Weight: 130.6 kg     Height: 5\' 6"  (1.676 m)     PainSc: 4   2      Isolation Precautions No active isolations  Medications Medications  acetaminophen (TYLENOL) tablet 650 mg (has no administration in time range)    Or  acetaminophen (TYLENOL) suppository 650 mg (has no administration in time range)  oxyCODONE (Oxy IR/ROXICODONE) immediate release tablet 5 mg (has no administration in time range)  0.45 % sodium chloride infusion ( Intravenous New Bag/Given 11/12/22 1351)  acetaminophen (TYLENOL) tablet 1,000 mg (1,000 mg Oral Given 11/12/22 0839)  sodium chloride 0.9 %  bolus 1,000 mL (0 mLs Intravenous Stopped 11/12/22 1224)    Mobility Wheelchair     Focused Assessments Neuro Assessment Handoff:  Swallow screen pass?  N/A Cardiac Rhythm: Normal sinus rhythm       Neuro Assessment: Within Defined Limits Neuro Checks:      Has TPA been given? No If patient is a Neuro Trauma and patient is going to OR before floor call report to 4N Charge nurse: [phone] or [phone]   R Recommendations: See Admitting Provider Note  Report given to:   Additional Notes:

## 2022-11-12 NOTE — ED Notes (Signed)
Secretary called informing them patient is being transported to 6th floor now

## 2022-11-13 ENCOUNTER — Encounter (HOSPITAL_COMMUNITY): Payer: Self-pay | Admitting: Cardiology

## 2022-11-13 DIAGNOSIS — I441 Atrioventricular block, second degree: Secondary | ICD-10-CM | POA: Diagnosis not present

## 2022-11-13 DIAGNOSIS — N179 Acute kidney failure, unspecified: Secondary | ICD-10-CM | POA: Diagnosis not present

## 2022-11-13 DIAGNOSIS — R609 Edema, unspecified: Secondary | ICD-10-CM

## 2022-11-13 DIAGNOSIS — I10 Essential (primary) hypertension: Secondary | ICD-10-CM

## 2022-11-13 LAB — COMPREHENSIVE METABOLIC PANEL
ALT: 9 U/L (ref 0–44)
AST: 9 U/L — ABNORMAL LOW (ref 15–41)
Albumin: 2.7 g/dL — ABNORMAL LOW (ref 3.5–5.0)
Alkaline Phosphatase: 101 U/L (ref 38–126)
Anion gap: 8 (ref 5–15)
BUN: 17 mg/dL (ref 8–23)
CO2: 24 mmol/L (ref 22–32)
Calcium: 8.2 mg/dL — ABNORMAL LOW (ref 8.9–10.3)
Chloride: 106 mmol/L (ref 98–111)
Creatinine, Ser: 1.04 mg/dL — ABNORMAL HIGH (ref 0.44–1.00)
GFR, Estimated: 57 mL/min — ABNORMAL LOW (ref >60)
Glucose, Bld: 191 mg/dL — ABNORMAL HIGH (ref 70–99)
Potassium: 3.8 mmol/L (ref 3.5–5.1)
Sodium: 138 mmol/L (ref 135–145)
Total Bilirubin: 0.9 mg/dL (ref 0.3–1.2)
Total Protein: 6 g/dL — ABNORMAL LOW (ref 6.5–8.1)

## 2022-11-13 LAB — CBC
HCT: 32.8 % — ABNORMAL LOW (ref 36.0–46.0)
Hemoglobin: 10.2 g/dL — ABNORMAL LOW (ref 12.0–15.0)
MCH: 30.5 pg (ref 26.0–34.0)
MCHC: 31.1 g/dL (ref 30.0–36.0)
MCV: 98.2 fL (ref 80.0–100.0)
Platelets: 259 10*3/uL (ref 150–400)
RBC: 3.34 MIL/uL — ABNORMAL LOW (ref 3.87–5.11)
RDW: 14.5 % (ref 11.5–15.5)
WBC: 11.7 10*3/uL — ABNORMAL HIGH (ref 4.0–10.5)
nRBC: 0 % (ref 0.0–0.2)

## 2022-11-13 LAB — GLUCOSE, CAPILLARY
Glucose-Capillary: 156 mg/dL — ABNORMAL HIGH (ref 70–99)
Glucose-Capillary: 197 mg/dL — ABNORMAL HIGH (ref 70–99)
Glucose-Capillary: 184 mg/dL — ABNORMAL HIGH (ref 70–99)
Glucose-Capillary: 183 mg/dL — ABNORMAL HIGH (ref 70–99)
Glucose-Capillary: 200 mg/dL — ABNORMAL HIGH (ref 70–99)
Glucose-Capillary: 187 mg/dL — ABNORMAL HIGH (ref 70–99)

## 2022-11-13 NOTE — Progress Notes (Signed)
curbsided Dr. Elberta Fortis, given lack of symptom and really poor functional ability, not a great candidate for invasive procedure. Recommend monitoring for any issue on telemetry while she is here, but if anything changes such as becoming more symptomatic with bradycardia, then can consider additional EP evaluation.

## 2022-11-13 NOTE — Evaluation (Addendum)
Physical Therapy Evaluation Patient Details Name: Jody Simpson MRN: [id number] DOB: August 15, 1950 Today's Date: 11/13/2022  History of Present Illness  72 yo morbidly obese female with significant history of diabetes, HTN, asthma, IBS, neuropathy, morbid obestiy, brought here via EMS from a senior living facility for evaluation of a fall.  Clinical Impression  Pt admitted with above diagnosis. Pt from senior living community, ind with bed to w/c transfers at baseline, amb short distances with rollator walker, performing self care tasks independently, has a friend who is assisting with household cleaning and laundry. Pt limited by R knee pain and L leg neuropathy complaints. Pt using bedrail significantly to power up to sitting EOB with min A and significantly increased time. Attempted to power to stand with max A+2, but unable due to pain and fear of falling. Pt needing max A+2 to return to supine and scoot up in bed with bedpad. Recommend post acute PT (<# hrs/day) prior to return to ind living. Pt currently with functional limitations due to the deficits listed below (see PT Problem List). Pt will benefit from acute skilled PT to increase their independence and safety with mobility to allow discharge.          Recommendations for follow up therapy are one component of a multi-disciplinary discharge planning process, led by the attending physician.  Recommendations may be updated based on patient status, additional functional criteria and insurance authorization.  Follow Up Recommendations Can patient physically be transported by private vehicle: No     Assistance Recommended at Discharge Frequent or constant Supervision/Assistance  Patient can return home with the following  Two people to help with walking and/or transfers;Two people to help with bathing/dressing/bathroom;Assistance with cooking/housework;Assist for transportation    Equipment Recommendations None recommended by PT   Recommendations for Other Services       Functional Status Assessment Patient has had a recent decline in their functional status and demonstrates the ability to make significant improvements in function in a reasonable and predictable amount of time.     Precautions / Restrictions Precautions Precautions: Fall Restrictions Weight Bearing Restrictions: No      Mobility  Bed Mobility Overal bed mobility: Needs Assistance Bed Mobility: Supine to Sit, Sit to Supine     Supine to sit: Min assist Sit to supine: Max assist, +2 for physical assistance   General bed mobility comments: min A to upright trunk into sitting EOB with strong use, max A +2 to lift BLE and manage trunk back into supine    Transfers Overall transfer level: Needs assistance                 General transfer comment: attempted to power to stand but unable with max A+2 due to knee pain and fear of falling    Ambulation/Gait                  Stairs            Wheelchair Mobility    Modified Rankin (Stroke Patients Only)       Balance Overall balance assessment: Needs assistance, History of Falls Sitting-balance support: Feet supported Sitting balance-[name]: Good                                       Pertinent Vitals/Pain Pain Assessment Pain Assessment: Faces Faces Pain Scale: Hurts even more Pain Location: R knee and L  leg Pain Descriptors / Indicators: Tender, Discomfort, Sore Pain Intervention(s): Limited activity within patient's tolerance, Monitored during session, Repositioned    Home Living Family/patient expects to be discharged to:: Other (Comment) [facility]) Living Arrangements: Alone Available Help at Discharge: Friend(s);Available PRN/intermittently Type of Home: Apartment Home Access: Elevator       Home Layout: One level Home Equipment: Rollator (4 wheels);Wheelchair - manual;Tub bench Additional Comments: pt  reports has adjustable bed and lift chair    Prior Function Prior Level of Function : Independent/Modified Independent             Mobility Comments: pt reports ind with stand pivot transfers to w/c, using rollator with HHPT ADLs Comments: pt reports ind with self care, friend does household chores, pt cooking simple meals     Hand Dominance   Dominant Hand: Right    Extremity/Trunk Assessment   Upper Extremity Assessment Upper Extremity Assessment: Generalized weakness    Lower Extremity Assessment Lower Extremity Assessment: Generalized weakness;RLE deficits/detail;LLE deficits/detail RLE Deficits / Details: ankle AROM WFL, knee flexion ~60 deg, full knee extension, hip AROM limited by body habitus, denies numbness/tingling, tenderness throughout anterior knee joint RLE Coordination: WNL LLE Deficits / Details: ankle AROM WFL, knee flexion ~60 deg, full knee extension, hip AROM limited by body habitus LLE Sensation: history of peripheral neuropathy LLE Coordination: WNL    Cervical / Trunk Assessment Cervical / Trunk Assessment: Normal  Communication   Communication: No difficulties  Cognition Arousal/Alertness: Awake/alert Behavior During Therapy: WFL for tasks assessed/performed Overall Cognitive Status: Within Functional Limits for tasks assessed                                          General Comments      Exercises     Assessment/Plan    PT Assessment Patient needs continued PT services  PT Problem List Decreased strength;Decreased range of motion;Decreased activity tolerance;Decreased balance;Decreased mobility;Decreased knowledge of use of DME;Decreased safety awareness;Pain;Obesity       PT Treatment Interventions DME instruction;Gait training;Functional mobility training;Therapeutic activities;Therapeutic exercise;Balance training;Neuromuscular re-education;Patient/family education    PT Goals (Current goals can be found in the  Care Plan section)  Acute Rehab PT Goals Patient Stated Goal: return home PT Goal Formulation: With patient Time For Goal Achievement: 11/27/22 Potential to Achieve Goals: Fair    Frequency Min 1X/week     Co-evaluation               AM-PAC PT "6 Clicks" Mobility  Outcome Measure Help needed turning from your back to your side while in a flat bed without using bedrails?: A Lot Help needed moving from lying on your back to sitting on the side of a flat bed without using bedrails?: A Lot Help needed moving to and from a bed to a chair (including a wheelchair)?: Total Help needed standing up from a chair using your arms (e.g., wheelchair or bedside chair)?: Total Help needed to walk in hospital room?: Total Help needed climbing 3-5 steps with a railing? : Total 6 Click Score: 8    End of Session Equipment Utilized During Treatment: Gait belt Activity Tolerance: Patient tolerated treatment well;Patient limited by pain Patient left: in bed;with call bell/phone within reach;with nursing/sitter in room Nurse Communication: Mobility status;Need for lift equipment PT Visit Diagnosis: Other abnormalities of gait and mobility (R26.89);Muscle weakness (generalized) (M62.81);History of falling (Z91.81);Pain Pain -  Right/Left: Right Pain - part of body: Knee    Time: 1610-9604 PT Time Calculation (min) (ACUTE ONLY): 49 min   Charges:   PT Evaluation $PT Eval Low Complexity: 1 Low PT Treatments $Therapeutic Activity: 23-37 mins        Tori Mei Suits PT, DPT 11/13/22, 12:40 PM

## 2022-11-13 NOTE — Progress Notes (Signed)
PROGRESS NOTE    Jody Simpson  ZOX:[id number] DOB: December 31, 1950 DOA: 11/12/2022 PCP: Theodis Shove, DO   Brief Narrative:  Jody Simpson is a 72 y.o. female with medical history significant of childhood asthma, community-acquired pneumonia, type 2 diabetes, GERD, gout, hypokalemia, hyperlipidemia, hypertension, IBS, renal cell carcinoma, class III obesity, neuropathy, nontraumatic rhabdomyolysis, paroxysmal atrial tachycardia, sinus arrhythmia, history of falls who was brought to the emergency department from her independent living facility due to having a mechanical/accidental fall and injuring her right knee while she was trying to transfer from her wheelchair to the recliner and slipped while transitioning.  Patient admitted overnight for elevated creatinine which is now resolved.  PT evaluating recommending ongoing rehab. Patient is medically stable for discharge but will likely need workup for safe disposition to either skilled nursing facility or back to her independent living facility if they have a rehab facility on campus.  Assessment & Plan:   Principal Problem:   AKI (acute kidney injury) (HCC) Active Problems:   Diabetes mellitus, type 2 (HCC)   Hyperlipemia   Gout   Essential hypertension   PAT (paroxysmal atrial tachycardia)   Knee contusion   Class 3 obesity (HCC)   Pulmonary nodule   Sinus bradycardia   Right knee injury   AKI (acute kidney injury) (HCC) Prerenal resolved overnight, continue to increase p.o. intake as appropriate Resume home medications at discharge    Mechanical fall right knee pain with negative imaging PT following recommending ongoing rehab   Sinus bradycardia, 2-1 AV block with transient third-degree AV block. Cardiology following, will discuss with the EP Patient should avoid AV nodal blocking agents Patient has failed multiple outpatient cardiac monitor attempts due to intolerance of adhesive   Diabetes mellitus, type 2  (HCC) Carbohydrate modified diet. Continue sliding scale, hypoglycemic protocol, 36 unit glargine daily(home dose)   Hyperlipemia Currently not on therapy. Follow-up with PCP.   Gout Continue allopurinol 300 mg p.o. daily.   Essential hypertension Continue amlodipine 10 mg p.o. daily.   Class 3 obesity (HCC) Current BMI 46.48 kg/m. Lifestyle modifications discussed at length. Follow-up with PCP and/or bariatric clinic.   History of pulmonary nodule incidentally noted  Repeat CT here "4 mm solid lingular nodule is stable. This could be followed with repeat CT in 18 months to ensure 2 years stability".  DVT prophylaxis: SCDs Start: 11/12/22 1258 Code Status:   Code Status: Full Code Family Communication: None present  Status is: OBS  Dispo: The patient is from: Independent living              Anticipated d/c is to: SNF              Anticipated d/c date is: Imminent, medically stable for discharge awaiting safe disposition              Patient currently is medically stable for discharge  Consultants:  Cardiology  Procedures:  None  Antimicrobials:  None  Subjective: No acute issues or events overnight denies nausea vomiting diarrhea constipation headache fevers chills or chest pain  Objective: Vitals:   11/12/22 2024 11/13/22 0022 11/13/22 0100 11/13/22 0407  BP: (!) 152/52 (!) 161/54  (!) 123/47  Pulse: (!) 50 (!) 49  (!) 46  Resp: 20   18  Temp: 98.7 F (37.1 C) 98.6 F (37 C)  98 F (36.7 C)  TempSrc: Oral Oral  Oral  SpO2: 92% 92% 96% 95%  Weight:      Height:  Intake/Output Summary (Last 24 hours) at 11/13/2022 0722 Last data filed at 11/13/2022 0334 Gross per 24 hour  Intake 2651.94 ml  Output 250 ml  Net 2401.94 ml   Filed Weights   11/12/22 0802 11/12/22 1615  Weight: 130.6 kg 130.6 kg    Examination:  General:  Pleasantly resting in bed, No acute distress. HEENT:  Normocephalic atraumatic.  Sclerae nonicteric, noninjected.   Extraocular movements intact bilaterally. Neck:  Without mass or deformity.  Trachea is midline. Lungs:  Without rhonchi, wheeze, or rales. Heart:  Regular rate and rhythm.  Abdomen:  Soft, nontender, obese, nondistended.  Without guarding or rebound. Extremities: Without cyanosis, clubbing  Data Reviewed: I have personally reviewed following labs and imaging studies  CBC: Recent Labs  Lab 11/12/22 0842 11/13/22 0604  WBC 13.3* 11.7*  NEUTROABS 10.8*  --   HGB 10.4* 10.2*  HCT 33.5* 32.8*  MCV 98.5 98.2  PLT 288 259   Basic Metabolic Panel: Recent Labs  Lab 11/12/22 0842 11/13/22 0604  NA 140 138  K 3.9 3.8  CL 106 106  CO2 25 24  GLUCOSE 245* 191*  BUN 20 17  CREATININE 1.24* 1.04*  CALCIUM 8.5* 8.2*   GFR: Estimated Creatinine Clearance: 68.8 mL/min (A) (by C-G formula based on SCr of 1.04 mg/dL (H)). Liver Function Tests: Recent Labs  Lab 11/13/22 0604  AST 9*  ALT 9  ALKPHOS 101  BILITOT 0.9  PROT 6.0*  ALBUMIN 2.7*   No results for input(s): "LIPASE", "AMYLASE" in the last 168 hours. No results for input(s): "AMMONIA" in the last 168 hours. Coagulation Profile: No results for input(s): "INR", "PROTIME" in the last 168 hours. Cardiac Enzymes: No results for input(s): "CKTOTAL", "CKMB", "CKMBINDEX", "TROPONINI" in the last 168 hours. BNP (last 3 results) No results for input(s): "PROBNP" in the last 8760 hours. HbA1C: No results for input(s): "HGBA1C" in the last 72 hours. CBG: Recent Labs  Lab 11/12/22 2038  GLUCAP 236*   Lipid Profile: No results for input(s): "CHOL", "HDL", "LDLCALC", "TRIG", "CHOLHDL", "LDLDIRECT" in the last 72 hours. Thyroid Function Tests: No results for input(s): "TSH", "T4TOTAL", "FREET4", "T3FREE", "THYROIDAB" in the last 72 hours. Anemia Panel: No results for input(s): "VITAMINB12", "FOLATE", "FERRITIN", "TIBC", "IRON", "RETICCTPCT" in the last 72 hours. Sepsis Labs: No results for input(s): "PROCALCITON",  "LATICACIDVEN" in the last 168 hours.  No results found for this or any previous visit (from the past 240 hour(s)).       Radiology Studies: CT CHEST WO CONTRAST  Result Date: 11/12/2022 CLINICAL DATA:  Lung nodule, > 8mm Ground glass nodule F/Up from CT done 4 plus months ago. EXAM: CT CHEST WITHOUT CONTRAST TECHNIQUE: Multidetector CT imaging of the chest was performed following the standard protocol without IV contrast. RADIATION DOSE REDUCTION: This exam was performed according to the departmental dose-optimization program which includes automated exposure control, adjustment of the mA and/or kV according to patient size and/or use of iterative reconstruction technique. COMPARISON:  07/01/2022 FINDINGS: Cardiovascular: Cardiomegaly. Dense mitral valve and aortic valve calcifications. Extensive coronary artery and scattered aortic calcifications. No aneurysm. Mediastinum/Nodes: No mediastinal, hilar, or axillary adenopathy. Trachea and esophagus are unremarkable. Thyroid unremarkable. Lungs/Pleura: Previously seen ground-glass nodular area in the anterior right upper lobe has resolved. Small solid lingular nodule measures 4 mm on image 79 of series 6, stable. No new or enlarging nodules. Bibasilar atelectasis. No effusions. Upper Abdomen: No acute findings Musculoskeletal: Chest wall soft tissues are unremarkable. No acute bony abnormality. IMPRESSION: Previously  seen ground-glass nodular area in the right upper lobe has resolved compatible with and inflammatory process. 4 mm solid lingular nodule is stable. This could be followed with repeat CT in 18 months to ensure 2 years stability. Coronary artery disease.  Mild cardiomegaly. Aortic Atherosclerosis (ICD10-I70.0). Electronically Signed   By: Charlett Nose M.D.   On: 11/12/2022 20:22   DG Knee Complete 4 Views Right  Result Date: 11/12/2022 CLINICAL DATA:  Unwitnessed fall EXAM: RIGHT KNEE - COMPLETE 4 VIEW COMPARISON:  None Available. FINDINGS:  No evidence of fracture, dislocation, or joint effusion. Moderate tricompartmental degenerative changes of the knee. Soft tissues are unremarkable. IMPRESSION: 1. No acute fracture or dislocation. 2. Moderate tricompartmental degenerative changes of the knee. Electronically Signed   By: Agustin Cree M.D.   On: 11/12/2022 09:22    Scheduled Meds:  allopurinol  300 mg Oral Daily   amLODipine  10 mg Oral Daily   insulin aspart  0-20 Units Subcutaneous TID WC   insulin glargine-yfgn  36 Units Subcutaneous QHS   loratadine  10 mg Oral Daily   montelukast  10 mg Oral QHS   Continuous Infusions:  sodium chloride 125 mL/hr at 11/13/22 0334     LOS: 0 days   Time spent:  Azucena Fallen, DO [facility]  If 7PM-7AM, please contact night-coverage [email or web address]  11/13/2022, 7:22 AM

## 2022-11-13 NOTE — Consult Note (Addendum)
WOC Nurse Consult Note: Reason for Consult: Consult requested for left leg.  Pt states she has a chronic wound to the left lower ankle area related to previous ortho surgeries and displaced foot anatomy. She has worn compression wraps in the past but currently there is no edema.  Dark red-purple linear Deep tissue pressure injury noted to anterior calf when previous ace wrap was removed; approx 1X3cm.  Pressure injury present on admission: Yes Wound type: Left ankle with 6X6cm area of red macerated skin, currently no odor, drainage, or edema. In the center is a chronic full thickness wound, .2X.2X.2cm, yellow and moist; appearance and location is probably related to internal hardware rubbing from inside to create constant pressure.  Dressing procedure/placement/frequency: Topical treatment orders provided for bedside nurses to perform as follows to protect from further injury:  Foam dressing to left leg, change Q 3 days or PRN soiling. Please re-consult if further assistance is needed.  Thank-you,  Cammie Mcgee MSN, RN, CWOCN, [place], CNS [phone]

## 2022-11-13 NOTE — Care Management Obs Status (Signed)
MEDICARE OBSERVATION STATUS NOTIFICATION   Patient Details  Name: Jody Simpson MRN: [id number] Date of Birth: 04-18-1951   Medicare Observation Status Notification Given:  Yes    Beckie Busing, RN 11/13/2022, 4:03 PM

## 2022-11-13 NOTE — Consult Note (Addendum)
Cardiology Consultation   Patient ID: SOLIANA Simpson MRN: [id number]; DOB: 1950/10/31  Admit date: 11/12/2022 Date of Consult: 11/13/2022  PCP:  Theodis Shove, DO   [facility] Providers Cardiologist:  Reatha Harps, MD        Patient Profile:   Jody Simpson is a 72 y.o. female with a hx of chronic leg edema/HFpEF(?),  hypertension, hyperlipidemia, DM2, morbid obesity, PAT, Mobitz 2 heart block and intermittent LBBB who is being seen 11/13/2022 for the evaluation of bradycardia at the request of Dr. Natale Milch.  History of Present Illness:   Jody Simpson is a 72 year old female with past medical history of chronic leg edema/HFpEF(?), chronic leg edema, hypertension, hyperlipidemia, DM2, morbid obesity, PAT, Mobitz 2 heart block and intermittent LBBB.  Patient was found unconscious and subsequently admitted in January 2024 was pneumonia.  Lactic acid was 3.3 at the time.  CK 1000.  She was found to have elevated troponin and new left bundle branch block.  Echocardiogram obtained on 06/16/2022 showed EF 60 to 65%, moderately elevated PASP, moderate LAE, mild MR, severe mitral annular calcification, mild aortic stenosis.  She was noted to have intermittent 2-1 heart block and a Wenckebach on top of first-degree AV block.  Her atenolol was held.  Her case was discussed with the EP team at the time who recommended outpatient heart monitor.  Given normal EF and the lack of chest discomfort, she was not placed on Plavix.  She has aspirin allergy.  Unfortunately she never wore her heart monitor.  She was seen for outpatient follow-up in February 2024 by Dr. Flora Lipps with noted she was tachycardic at the time.  EKG demonstrated either sinus tachycardia versus SVT, narrow QRS, heart rate of 112.  Dr. Flora Lipps recommended restarting a low-dose beta-blocker of Toprol-XL 25 mg daily and obtain heart monitor afterward to make sure she does not have persistently slow rhythm.  She was with  residing in [place] at the time.  She never got her metoprolol prescription from [facility].  She was also unable to wear the heart monitor due to severe allergic reaction with adhesive.  She has since moved to [facility] independent living facility.  [facility] contacted our office on 11/11/2022 who was visiting the patient to wrap her leg when it was noted her heart rate was in low 40s.  She has been scheduled to see Edd Fabian NP on 6/18/202024.   Unfortunately, prior to her scheduled follow-up, she ended up being admitted to [facility] after she fell at her facility during transfer from wheelchair to recliner.  She denies any recent chest pain, palpitation, dizziness or feeling of passing out.  At baseline, she is not very active.  She is able to walk at most 100 feet on her rollator however prefers to stay in the wheelchair due to balance issue and significant lower extremity neuropathy.  This is the second fall she had in the past 3 months.  Right knee x-ray showed no evidence of acute fracture or dislocation, degenerative changes of the knee was present.  Other significant blood work on arrival included creatinine mildly elevated to 1.24.  Sodium electrolyte were normal.  White blood cell count borderline elevated at 13.3.  Hemoglobin 10.4.  Urinalysis showed hazy urine with moderate hemoglobin and a 100 protein.  She had rare bacteria on reflex microscopy.  Chest CT without contrast showed resolution of previously seen right upper lobe groundglass nodular area compatible with inflammatory process, stable  4 mm solid lingular nodule, recommend repeat CT in 18 months, coronary artery disease and mild cardiomegaly.  Her home spironolactone and lisinopril were held due to mild AKI.  She was continued on home amlodipine.  Overnight, on telemetry she had sinus bradycardia with 2-1 heart block and transient intermittent third-degree AV block.  Heart rate has been in the low 40s throughout the  night.  Cardiology service consulted for advanced conduction disease.  Talking with the patient, she has been asymptomatic with her bradycardia and denies any feeling of passing out.  She did complain of some mild dizziness several months ago, however this improved after she moved her amlodipine to nighttime and kept spironolactone and lisinopril in the morning.  She never started on the metoprolol succinate.   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 Authorizing Provider  acetaminophen (TYLENOL) 325 MG tablet Take 650 mg by mouth every 4 (four) hours as needed for mild pain.   Yes [provider]  allopurinol (ZYLOPRIM) 300 MG tablet Take 300 mg by mouth daily. 04/11/16  Yes [provider]  amLODipine (NORVASC) 10 MG tablet Take 1 tablet (10 mg total) by mouth daily. 07/21/22  Yes O'Neal, Ronnald Ramp, MD  fluticasone [facility]) 50 MCG/ACT nasal spray Place 2 sprays into both nostrils daily as needed for allergies.   Yes [provider]  insulin aspart (NOVOLOG) 100 UNIT/ML injection Inject 6 Units into the skin 3 (three) times daily with meals. 06/26/22  Yes  Regalado, Belkys A, MD  insulin glargine (LANTUS) 100 UNIT/ML Solostar Pen Inject 25 Units into the skin daily. Patient taking differently: Inject 36 Units into the skin daily. 06/27/22  Yes Russella Dar, NP  lisinopril (ZESTRIL) 40 MG tablet Take 1 tablet (40 mg total) by mouth daily. 06/25/22 11/12/22 Yes [place], Eric J, DO  loratadine (CLARITIN) 10 MG tablet Take 10 mg by mouth daily.   Yes [provider]  montelukast (SINGULAIR) 10 MG tablet Take 10 mg by mouth at bedtime.   Yes [provider]  spironolactone (ALDACTONE) 25 MG tablet Take 1 tablet (25 mg total) by mouth daily. 06/25/22 11/12/22 Yes [place], Eric J, DO  albuterol (VENTOLIN HFA) 108 (90 Base) MCG/ACT inhaler Inhale 2 puffs into the lungs every 4 (four) hours as needed for wheezing or shortness of breath.    [provider]  baclofen (LIORESAL) 10 MG tablet Take 10 mg by mouth daily as needed for muscle spasms.    [provider]    Inpatient Medications: Scheduled Meds:  allopurinol  300 mg Oral Daily   amLODipine  10 mg Oral Daily   insulin aspart  0-20 Units Subcutaneous TID WC   insulin glargine-yfgn  36 Units Subcutaneous QHS   loratadine  10 mg Oral Daily   montelukast  10 mg Oral QHS   Continuous Infusions:  sodium chloride 125 mL/hr at 11/13/22 0334   PRN Meds: acetaminophen **OR** acetaminophen, baclofen, fluticasone, oxyCODONE  Allergies:    Allergies  Allergen Reactions   Aspirin     REACTION: stomach irritation   Banana    Cholestyramine     REACTION: Diarrhea and nausea   Codeine     REACTION: stomach irritant   Diazepam     REACTION: hyper   Hydrocodone    Ibuprofen Other (See Comments)   Iodine    Latex    Meperidine Hcl    Metformin     REACTION: Diarrhea   Naproxen Sodium     REACTION: stomach irritant   Penicillins    Pravastatin Sodium     REACTION: Flu like symptoms   Sitagliptin Phosphate     REACTION: Caused elevated CBGs   Tape Rash     Tegaderm transparent plastic film    Social History:   Social History   Socioeconomic History   Marital status: Single    Spouse name: Not on file   Number of children: Not on file   Years of education: Not on file   Highest education level: Not on file  Occupational History   Not on file  Tobacco Use   Smoking status: Never   Smokeless tobacco: Never  Vaping Use   Vaping Use: Never used  Substance and Sexual Activity   Alcohol use: No   Drug use: No   Sexual activity: Not Currently  Other Topics Concern   Not on file  Social History Narrative   Not on file   Social Determinants of Health   Financial Resource Strain: Not on file  Food Insecurity: No Food Insecurity (11/12/2022)   Hunger Vital Sign    Worried About Running Out of Food in the Last Year: Never true    Ran Out of Food in the Last Year: Never true  Transportation Needs: No Transportation Needs (11/12/2022)   PRAPARE - Administrator, Civil Service (Medical): No    Lack of Transportation (Non-Medical): No  Physical Activity: Not on file  Stress: Not on file  Social Connections: Not on file  Intimate Partner Violence: Not At Risk (11/12/2022)   Humiliation, Afraid, Rape, and Kick questionnaire    Fear of Current or Ex-Partner: No    Emotionally Abused: No    Physically Abused: No    Sexually Abused: No    Family History:    Family History  Problem Relation Age of Onset   CAD Father        hx of CABG   Heart disease Father    Diabetes Brother    Breast cancer Mother      ROS:  Please see the history of present illness.   All other ROS reviewed and negative.     Physical Exam/Data:   Vitals:   11/12/22 2024 11/13/22 0022 11/13/22 0100 11/13/22 0407  BP: (!) 152/52 (!) 161/54  (!) 123/47  Pulse: (!) 50 (!) 49  (!) 46  Resp: 20   18  Temp: 98.7 F (37.1 C) 98.6 F (37 C)  98 F (36.7 C)  TempSrc: Oral Oral  Oral  SpO2: 92% 92% 96% 95%  Weight:      Height:         Intake/Output Summary (Last 24 hours) at 11/13/2022 0845 Last data filed at 11/13/2022 0334 Gross per 24 hour  Intake 2651.94 ml  Output 250 ml  Net 2401.94 ml      11/12/2022    4:15 PM 11/12/2022    8:02 AM 07/01/2022    5:46 PM  Last 3 Weights  Weight (lbs) 288 lb 288 lb 307 lb 4.8 oz  Weight (kg) 130.636 kg 130.636 kg 139.39 kg     Body mass index is 46.48 kg/m.  General:  Well nourished, well developed, in no acute distress HEENT: normal Neck: no JVD Vascular: No carotid bruits; Distal pulses 2+ bilaterally Cardiac:  normal S1, S2; RRR; no murmur  Lungs:  clear to auscultation bilaterally, no wheezing, rhonchi or rales  Abd: soft, nontender, no hepatomegaly  Ext: no edema Musculoskeletal:  No deformities, BUE and BLE strength normal and equal Skin: warm and dry  Neuro:  CNs 2-12 intact, no focal abnormalities noted Psych:  Normal affect   EKG:  The EKG was personally reviewed and demonstrates: Sinus bradycardia, advanced AV block Telemetry:  Telemetry was personally reviewed and demonstrates: 2-1 AV block with intermittent third-degree heart block overnight, heart rate has been in the low 40s.  Relevant CV Studies:  Echo 06/16/2022  1. Left ventricular ejection fraction, by estimation, is 60 to 65%. The  left ventricle has normal function. The left ventricle has no regional  wall motion abnormalities. There is mild left ventricular hypertrophy.  Left ventricular diastolic parameters  are indeterminate.   2. Right ventricular systolic function is normal. The right ventricular  size is normal. There is moderately elevated pulmonary artery systolic  pressure.   3. Left atrial size was moderately dilated.   4. The mitral valve is degenerative. Mild mitral valve regurgitation. No  evidence of mitral stenosis. Severe mitral annular calcification.   5. The aortic valve was not well visualized. There is moderate  calcification of the aortic valve. There is moderate  thickening of the  aortic valve. Aortic valve regurgitation is not visualized. Mild aortic  valve stenosis.   6. The inferior vena cava is normal in size with greater than 50%  respiratory variability, suggesting right atrial pressure of 3 mmHg.   Laboratory Data:  High Sensitivity Troponin:  No results for input(s): "TROPONINIHS" in the last 720 hours.   Chemistry Recent Labs  Lab 11/12/22 0842 11/13/22 0604  NA 140 138  K 3.9 3.8  CL 106 106  CO2 25 24  GLUCOSE 245* 191*  BUN 20 17  CREATININE 1.24* 1.04*  CALCIUM 8.5* 8.2*  GFRNONAA 47* 57*  ANIONGAP 9 8    Recent Labs  Lab 11/13/22 0604  PROT 6.0*  ALBUMIN 2.7*  AST 9*  ALT 9  ALKPHOS 101  BILITOT 0.9   Lipids No results for input(s): "CHOL", "TRIG", "HDL", "LABVLDL", "LDLCALC", "CHOLHDL" in the last 168 hours.  Hematology Recent Labs  Lab 11/12/22 0842 11/13/22 0604  WBC 13.3* 11.7*  RBC 3.40* 3.34*  HGB 10.4* 10.2*  HCT 33.5* 32.8*  MCV 98.5 98.2  MCH 30.6 30.5  MCHC 31.0 31.1  RDW 14.5 14.5  PLT 288 259   Thyroid No results for input(s): "TSH", "FREET4" in the last 168 hours.  BNPNo results for input(s): "BNP", "PROBNP" in the last 168  hours.  DDimer No results for input(s): "DDIMER" in the last 168 hours.   Radiology/Studies:  CT CHEST WO CONTRAST  Result Date: 11/12/2022 CLINICAL DATA:  Lung nodule, > 8mm Ground glass nodule F/Up from CT done 4 plus months ago. EXAM: CT CHEST WITHOUT CONTRAST TECHNIQUE: Multidetector CT imaging of the chest was performed following the standard protocol without IV contrast. RADIATION DOSE REDUCTION: This exam was performed according to the departmental dose-optimization program which includes automated exposure control, adjustment of the mA and/or kV according to patient size and/or use of iterative reconstruction technique. COMPARISON:  07/01/2022 FINDINGS: Cardiovascular: Cardiomegaly. Dense mitral valve and aortic valve calcifications. Extensive coronary artery  and scattered aortic calcifications. No aneurysm. Mediastinum/Nodes: No mediastinal, hilar, or axillary adenopathy. Trachea and esophagus are unremarkable. Thyroid unremarkable. Lungs/Pleura: Previously seen ground-glass nodular area in the anterior right upper lobe has resolved. Small solid lingular nodule measures 4 mm on image 79 of series 6, stable. No new or enlarging nodules. Bibasilar atelectasis. No effusions. Upper Abdomen: No acute findings Musculoskeletal: Chest wall soft tissues are unremarkable. No acute bony abnormality. IMPRESSION: Previously seen ground-glass nodular area in the right upper lobe has resolved compatible with and inflammatory process. 4 mm solid lingular nodule is stable. This could be followed with repeat CT in 18 months to ensure 2 years stability. Coronary artery disease.  Mild cardiomegaly. Aortic Atherosclerosis (ICD10-I70.0). Electronically Signed   By: Charlett Nose M.D.   On: 11/12/2022 20:22   DG Knee Complete 4 Views Right  Result Date: 11/12/2022 CLINICAL DATA:  Unwitnessed fall EXAM: RIGHT KNEE - COMPLETE 4 VIEW COMPARISON:  None Available. FINDINGS: No evidence of fracture, dislocation, or joint effusion. Moderate tricompartmental degenerative changes of the knee. Soft tissues are unremarkable. IMPRESSION: 1. No acute fracture or dislocation. 2. Moderate tricompartmental degenerative changes of the knee. Electronically Signed   By: Agustin Cree M.D.   On: 11/12/2022 09:22     Assessment and Plan:   2:1 AV block with occasional third-degree AV block: Will discuss with MD.  Patient is asymptomatic despite his advanced conduction disorder.  She is not a very active person at baseline due to frailty, lower extremity neuropathy and imbalance issue.  She mostly stays in wheelchair however able to walk with a rollator for maximum distance of 100 feet.  She has fallen twice in the past 3 months.  She is not on any AV nodal blocking agent and should avoid any AV nodal blocking  agent.  Likely will need to curbside the EP service.  Heart monitor has been ordered twice both in January and February, she has not been able to wear the heart monitor due to severe allergic reaction with adhesive.  Chronic leg edema: EF 60 to 65% on previous echocardiogram in January 2024.  Lower extremity being wrapped.  Although previously carry a diagnosis of HFpEF, I do not think patient truly has heart failure.  Lower extremity edema may be more related to dependent edema from obesity and sedentary lifestyle.  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  Creatinine was 1.2 on arrival.  Restart when able.  Hyperlipidemia  DM2  History of PAT: Not seen on telemetry during this hospitalization.  She was seen in sinus tachycardia or SVT based on EKG in February during office visit.  She is not a candidate for AV nodal  blocking agent due to #1  For questions or updates, please contact [facility] Please consult [email or web address] for contact info under    Signed, Azalee Course, PA  11/13/2022 8:45 AM  History and all data above reviewed.  Patient examined.  I agree with the findings as above.   The patient has a history of bradycardia as above but has had incomplete evaluation as mentioned above.  She is also had some documented tachycardia so technically I think she has a tachybradycardia rate so she has not been particularly symptomatic.  There has not been any syncope.  She does not notice her heart going slow.  She did not have any syncope this admission.  She has had multiple episodes of 2-1 heart block and episodes of complete heart block.  For the most part her escape rhythm has been 40-50.  She has been narrow complex as well as some wide-complex escape rhythm.  There is been some episodes of  nonsustained VT.  She denies any chest pressure, neck or arm discomfort.  She is very limited by apparent joint problems and her morbid obesity.  Of note her echocardiogram does demonstrate RV dysfunction with elevated right heart pressures.  Has not been witnessed sleep apnea.  She is on oxygen here but has not been at home.  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  The patient exam reveals COR: Regular rate and rhythm, no murmurs,  Lungs: Clear to auscultation bilaterally,  Abd: Positive bowel sounds normal frequency pitch, bruits, rebound, guarding, Ext chronic venous stasis changes with mild bilateral lower extremity edema.  All available labs, radiology testing, previous records reviewed. Agree with documented assessment and plan.  Bradycardia: The patient does have 2 1 heart block at episodes of complete heart block.   This is during the waking hours.  It is not associated with any sleep apnea which has not ever been diagnosed.  There are no AV nodal blocking agents with negative chronotropic's.  At this point she likely is going to be a pacemaker given this finding and also previous history of supraventricular tachycardia that might need therapy.  However, I think this could be done electively and we will discuss this with our electrophysiology colleagues.  At this point no change in therapy.  Her biggest issue appears to be weakness and the fact that she has to get herself up out of bed and into her wheelchair.  This is where her fall.  I am not sure she can return to her previous living situation but will defer to the primary team.  Rollene Rotunda  1:45 PM  11/13/2022

## 2022-11-14 DIAGNOSIS — I441 Atrioventricular block, second degree: Secondary | ICD-10-CM | POA: Diagnosis not present

## 2022-11-14 DIAGNOSIS — I10 Essential (primary) hypertension: Secondary | ICD-10-CM | POA: Diagnosis not present

## 2022-11-14 DIAGNOSIS — R609 Edema, unspecified: Secondary | ICD-10-CM | POA: Diagnosis not present

## 2022-11-14 DIAGNOSIS — N179 Acute kidney failure, unspecified: Secondary | ICD-10-CM | POA: Diagnosis not present

## 2022-11-14 LAB — GLUCOSE, CAPILLARY
Glucose-Capillary: 141 mg/dL — ABNORMAL HIGH (ref 70–99)
Glucose-Capillary: 152 mg/dL — ABNORMAL HIGH (ref 70–99)
Glucose-Capillary: 154 mg/dL — ABNORMAL HIGH (ref 70–99)
Glucose-Capillary: 132 mg/dL — ABNORMAL HIGH (ref 70–99)

## 2022-11-14 NOTE — NC FL2 (Signed)
[place] MEDICAID FL2 LEVEL OF CARE FORM     IDENTIFICATION  Patient Name: Jody Simpson Birthdate: [birth year] Sex: female Admission Date (Current Location): 11/12/2022  [facility] and [place] Number:  Producer, television/film/video and Address:  [facility],  [address] [place], [place] [address]      Provider Number: [id number]  Attending Physician Name and Address:  Azucena Fallen, MD  Relative Name and Phone Number:  Jody Simpson [phone]    Current Level of Care: Hospital Recommended Level of Care:   Prior Approval Number:    Date Approved/Denied:   PASRR Number: [id number]  Discharge Plan: SNF    Current Diagnoses: Patient Active Problem List   Diagnosis Date Noted   AKI (acute kidney injury) (HCC) 11/12/2022   Knee contusion 11/12/2022   Class 3 obesity (HCC) 11/12/2022   Pulmonary nodule 11/12/2022   Sinus bradycardia 11/12/2022   Right knee injury 11/12/2022   Acute respiratory failure with hypoxia (HCC) 07/01/2022   Acute kidney injury superimposed on chronic kidney disease (HCC) 07/01/2022   Asthma, chronic 07/01/2022   Acute respiratory failure (HCC) 07/01/2022   Sepsis secondary to UTI (HCC) 06/15/2022   Trigger finger 12/04/2017   Blepharitis of upper and lower eyelids of both eyes 09/22/2017   Family history of glaucoma 09/22/2017   Nuclear sclerotic cataract of both eyes 09/22/2017   CAP (community acquired pneumonia) 07/03/2016   Obesity-BMI 51 07/03/2016   Fall-down at home x 48 hrs 07/03/2016   PAT (paroxysmal atrial tachycardia) 07/03/2016   Ankle pain, left 10/21/2011   COLONIC POLYPS 09/08/2007   Gout 09/08/2007   GASTROESOPHAGEAL REFLUX DISEASE 09/08/2007   IRRITABLE BOWEL SYNDROME 09/08/2007   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) 07/28/2006   Hyperlipemia 07/28/2006   SOLITARY KIDNEY 07/28/2006   Diabetes mellitus, type 2 (HCC) 07/21/2006   Essential hypertension 07/21/2006   DERMATITIS, ATOPIC  07/21/2006   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    Orientation RESPIRATION BLADDER Height & Weight     Self, Time, Situation  Normal Incontinent Weight: 130.6 kg Height:  5\' 6"  (167.6 cm)  BEHAVIORAL SYMPTOMS/MOOD NEUROLOGICAL BOWEL NUTRITION STATUS     (n/a) Continent Diet  AMBULATORY STATUS COMMUNICATION OF NEEDS Skin   Extensive Assist (unable to ambulate due to knee pain) Verbally Other (Comment) (wounmd incision pretibial distal , left)                       Personal Care Assistance Level of Assistance  Bathing, Feeding, Dressing Bathing Assistance: Maximum assistance Feeding assistance: Independent Dressing Assistance: Maximum assistance     Functional Limitations Info  Sight, Hearing, Speech Sight Info: Adequate Hearing Info: Adequate Speech Info: Adequate    SPECIAL CARE FACTORS FREQUENCY  PT (By licensed PT), OT (By licensed OT)     PT Frequency: 5X/wk OT Frequency: 5X/wk            Contractures Contractures Info: Not present    Additional Factors Info  Code Status, Allergies, Psychotropic, Insulin Sliding Scale, Isolation Precautions, Suctioning Needs Code Status Info: Full Allergies Info: : Aspirin, Banana, Cholestyramine, Codeine, Diazepam, Hydrocodone, Ibuprofen, Iodine, Latex, Meperidine Hcl, Metformin, Naproxen Sodium, Penicillins, Pravastatin Sodium, Sitagliptin Phosphate, Tape Psychotropic Info: see d/c summary Insulin Sliding Scale Info: see d/c summary Isolation Precautions Info: n/a Suctioning Needs: n/a   Current Medications (11/14/2022):  This is the current hospital active medication list Current Facility-Administered Medications  Medication Dose Route Frequency Provider Last Rate Last Admin  acetaminophen (TYLENOL) tablet 650 mg  650 mg Oral Q6H PRN Bobette Mo, MD   650 mg at 11/14/22 1433   Or   acetaminophen (TYLENOL) suppository 650 mg  650 mg Rectal Q6H PRN Bobette Mo, MD       allopurinol (ZYLOPRIM) tablet 300 mg   300 mg Oral Daily Bobette Mo, MD   300 mg at 11/14/22 1048   amLODipine (NORVASC) tablet 10 mg  10 mg Oral Daily Bobette Mo, MD   10 mg at 11/13/22 2215   baclofen (LIORESAL) tablet 10 mg  10 mg Oral Daily PRN Bobette Mo, MD       fluticasone [facility]) 50 MCG/ACT nasal spray 2 spray  2 spray Each Nare Daily PRN Bobette Mo, MD       insulin aspart (novoLOG) injection 0-20 Units  0-20 Units Subcutaneous TID WC Bobette Mo, MD   3 Units at 11/14/22 1309   insulin glargine-yfgn (SEMGLEE) injection 36 Units  36 Units Subcutaneous QHS Bobette Mo, MD   36 Units at 11/13/22 2216   loratadine (CLARITIN) tablet 10 mg  10 mg Oral Daily Bobette Mo, MD   10 mg at 11/14/22 1048   montelukast (SINGULAIR) tablet 10 mg  10 mg Oral QHS Bobette Mo, MD   10 mg at 11/13/22 2134   oxyCODONE (Oxy IR/ROXICODONE) immediate release tablet 5 mg  5 mg Oral Q4H PRN Bobette Mo, MD         Discharge Medications: Please see discharge summary for a list of discharge medications.  Relevant Imaging Results:  Relevant Lab Results:   Additional Information SS# [id number]  Beckie Busing, RN

## 2022-11-14 NOTE — Progress Notes (Signed)
PROGRESS NOTE    Jody Simpson  ZOX:[id number] DOB: 1950-10-26 DOA: 11/12/2022 PCP: Theodis Shove, DO   Brief Narrative:  Jody Simpson is a 72 y.o. female with medical history significant of childhood asthma, community-acquired pneumonia, type 2 diabetes, GERD, gout, hypokalemia, hyperlipidemia, hypertension, IBS, renal cell carcinoma, class III obesity, neuropathy, nontraumatic rhabdomyolysis, paroxysmal atrial tachycardia, sinus arrhythmia, history of falls who was brought to the emergency department from her independent living facility due to having a mechanical/accidental fall and injuring her right knee while she was trying to transfer from her wheelchair to the recliner and slipped while transitioning.  Patient admitted overnight for elevated creatinine which is now resolved.  PT evaluating recommending ongoing rehab. Curbside discussion with EP per Cardiology consult - poor candidate for pacemaker given poor baseline functional status and lack of symptoms. May benefit from outpatient monitoring if she begins to have symptoms. Patient is medically stable for discharge but will likely need workup for safe disposition to either skilled nursing facility or back to her independent living facility if they have a rehab facility on campus.  Assessment & Plan:   Principal Problem:   AKI (acute kidney injury) (HCC) Active Problems:   Diabetes mellitus, type 2 (HCC)   Hyperlipemia   Gout   Essential hypertension   PAT (paroxysmal atrial tachycardia)   Knee contusion   Class 3 obesity (HCC)   Pulmonary nodule   Sinus bradycardia   Right knee injury   AKI (acute kidney injury) (HCC) Prerenal resolved overnight, continue to increase p.o. intake as appropriate Resume home medications at discharge    Mechanical fall right knee pain with negative imaging PT following recommending ongoing rehab   Sinus bradycardia, 2-1 AV block with transient third-degree AV block. Cardiology  following, discussed with the EP - not candidate due to poor functional ability/lack of symptoms Patient should avoid AV nodal blocking agents Patient has failed multiple outpatient cardiac monitor attempts due to intolerance of adhesive   Diabetes mellitus, type 2 (HCC) Carbohydrate modified diet. Continue sliding scale, hypoglycemic protocol, 36 unit glargine daily(home dose)   Hyperlipemia Currently not on therapy. Follow-up with PCP.   Gout Continue allopurinol 300 mg p.o. daily.   Essential hypertension Continue amlodipine 10 mg p.o. daily.   Class 3 obesity (HCC) Current BMI 46.48 kg/m. Lifestyle modifications discussed at length. Follow-up with PCP and/or bariatric clinic.   History of pulmonary nodule incidentally noted  Repeat CT here "4 mm solid lingular nodule is stable. This could be followed with repeat CT in 18 months to ensure 2 years stability".  DVT prophylaxis: SCDs Start: 11/12/22 1258 Code Status:   Code Status: Full Code Family Communication: None present  Status is: OBS  Dispo: The patient is from: Independent living              Anticipated d/c is to: SNF              Anticipated d/c date is: Imminent, medically stable for discharge awaiting safe disposition              Patient currently is medically stable for discharge  Consultants:  Cardiology  Procedures:  None  Antimicrobials:  None  Subjective: No acute issues or events overnight denies nausea vomiting diarrhea constipation headache fevers chills or chest pain  Objective: Vitals:   11/13/22 0407 11/13/22 1316 11/13/22 2006 11/14/22 0504  BP: (!) 123/47 (!) 154/55 (!) 139/53 (!) 145/52  Pulse: (!) 46 (!) 45 (!) 54 (!)  45  Resp: 18 18 (!) 24 20  Temp: 98 F (36.7 C) 98.2 F (36.8 C) 98.4 F (36.9 C) (!) 97.5 F (36.4 C)  TempSrc: Oral Oral Oral Oral  SpO2: 95% 96% (!) 88% 94%  Weight:      Height:        Intake/Output Summary (Last 24 hours) at 11/14/2022 0737 Last data  filed at 11/13/2022 1833 Gross per 24 hour  Intake 720 ml  Output 300 ml  Net 420 ml    Filed Weights   11/12/22 0802 11/12/22 1615  Weight: 130.6 kg 130.6 kg    Examination:  General:  Pleasantly resting in bed, No acute distress. HEENT:  Normocephalic atraumatic.  Sclerae nonicteric, noninjected.  Extraocular movements intact bilaterally. Neck:  Without mass or deformity.  Trachea is midline. Lungs:  Without rhonchi, wheeze, or rales. Heart:  Regular rate and rhythm.  Abdomen:  Soft, nontender, obese, nondistended.  Without guarding or rebound. Extremities: Without cyanosis, clubbing  Data Reviewed: I have personally reviewed following labs and imaging studies  CBC: Recent Labs  Lab 11/12/22 0842 11/13/22 0604  WBC 13.3* 11.7*  NEUTROABS 10.8*  --   HGB 10.4* 10.2*  HCT 33.5* 32.8*  MCV 98.5 98.2  PLT 288 259    Basic Metabolic Panel: Recent Labs  Lab 11/12/22 0842 11/13/22 0604  NA 140 138  K 3.9 3.8  CL 106 106  CO2 25 24  GLUCOSE 245* 191*  BUN 20 17  CREATININE 1.24* 1.04*  CALCIUM 8.5* 8.2*    GFR: Estimated Creatinine Clearance: 68.8 mL/min (A) (by C-G formula based on SCr of 1.04 mg/dL (H)). Liver Function Tests: Recent Labs  Lab 11/13/22 0604  AST 9*  ALT 9  ALKPHOS 101  BILITOT 0.9  PROT 6.0*  ALBUMIN 2.7*    No results for input(s): "LIPASE", "AMYLASE" in the last 168 hours. No results for input(s): "AMMONIA" in the last 168 hours. Coagulation Profile: No results for input(s): "INR", "PROTIME" in the last 168 hours. Cardiac Enzymes: No results for input(s): "CKTOTAL", "CKMB", "CKMBINDEX", "TROPONINI" in the last 168 hours. BNP (last 3 results) No results for input(s): "PROBNP" in the last 8760 hours. HbA1C: No results for input(s): "HGBA1C" in the last 72 hours. CBG: Recent Labs  Lab 11/13/22 1255 11/13/22 1624 11/13/22 2008 11/13/22 2142 11/14/22 0734  GLUCAP 184* 183* 200* 187* 141*    Lipid Profile: No results for  input(s): "CHOL", "HDL", "LDLCALC", "TRIG", "CHOLHDL", "LDLDIRECT" in the last 72 hours. Thyroid Function Tests: No results for input(s): "TSH", "T4TOTAL", "FREET4", "T3FREE", "THYROIDAB" in the last 72 hours. Anemia Panel: No results for input(s): "VITAMINB12", "FOLATE", "FERRITIN", "TIBC", "IRON", "RETICCTPCT" in the last 72 hours. Sepsis Labs: No results for input(s): "PROCALCITON", "LATICACIDVEN" in the last 168 hours.  No results found for this or any previous visit (from the past 240 hour(s)).       Radiology Studies: CT CHEST WO CONTRAST  Result Date: 11/12/2022 CLINICAL DATA:  Lung nodule, > 8mm Ground glass nodule F/Up from CT done 4 plus months ago. EXAM: CT CHEST WITHOUT CONTRAST TECHNIQUE: Multidetector CT imaging of the chest was performed following the standard protocol without IV contrast. RADIATION DOSE REDUCTION: This exam was performed according to the departmental dose-optimization program which includes automated exposure control, adjustment of the mA and/or kV according to patient size and/or use of iterative reconstruction technique. COMPARISON:  07/01/2022 FINDINGS: Cardiovascular: Cardiomegaly. Dense mitral valve and aortic valve calcifications. Extensive coronary artery and scattered aortic  calcifications. No aneurysm. Mediastinum/Nodes: No mediastinal, hilar, or axillary adenopathy. Trachea and esophagus are unremarkable. Thyroid unremarkable. Lungs/Pleura: Previously seen ground-glass nodular area in the anterior right upper lobe has resolved. Small solid lingular nodule measures 4 mm on image 79 of series 6, stable. No new or enlarging nodules. Bibasilar atelectasis. No effusions. Upper Abdomen: No acute findings Musculoskeletal: Chest wall soft tissues are unremarkable. No acute bony abnormality. IMPRESSION: Previously seen ground-glass nodular area in the right upper lobe has resolved compatible with and inflammatory process. 4 mm solid lingular nodule is stable. This  could be followed with repeat CT in 18 months to ensure 2 years stability. Coronary artery disease.  Mild cardiomegaly. Aortic Atherosclerosis (ICD10-I70.0). Electronically Signed   By: Charlett Nose M.D.   On: 11/12/2022 20:22   DG Knee Complete 4 Views Right  Result Date: 11/12/2022 CLINICAL DATA:  Unwitnessed fall EXAM: RIGHT KNEE - COMPLETE 4 VIEW COMPARISON:  None Available. FINDINGS: No evidence of fracture, dislocation, or joint effusion. Moderate tricompartmental degenerative changes of the knee. Soft tissues are unremarkable. IMPRESSION: 1. No acute fracture or dislocation. 2. Moderate tricompartmental degenerative changes of the knee. Electronically Signed   By: Agustin Cree M.D.   On: 11/12/2022 09:22    Scheduled Meds:  allopurinol  300 mg Oral Daily   amLODipine  10 mg Oral Daily   insulin aspart  0-20 Units Subcutaneous TID WC   insulin glargine-yfgn  36 Units Subcutaneous QHS   loratadine  10 mg Oral Daily   montelukast  10 mg Oral QHS   Continuous Infusions:     LOS: 0 days   Time spent:  Azucena Fallen, DO [facility]  If 7PM-7AM, please contact night-coverage [email or web address]  11/14/2022, 7:37 AM

## 2022-11-14 NOTE — Progress Notes (Signed)
Progress Note  Patient Name: Jody Simpson Date of Encounter: 11/14/2022  Primary Cardiologist:   Reatha Harps, MD   Subjective   She feels SOB with wheezing.  Too weak to stand yesterday.   Inpatient Medications    Scheduled Meds:  allopurinol  300 mg Oral Daily   amLODipine  10 mg Oral Daily   insulin aspart  0-20 Units Subcutaneous TID WC   insulin glargine-yfgn  36 Units Subcutaneous QHS   loratadine  10 mg Oral Daily   montelukast  10 mg Oral QHS   Continuous Infusions:  PRN Meds: acetaminophen **OR** acetaminophen, baclofen, fluticasone, oxyCODONE   Vital Signs    Vitals:   11/13/22 0407 11/13/22 1316 11/13/22 2006 11/14/22 0504  BP: (!) 123/47 (!) 154/55 (!) 139/53 (!) 145/52  Pulse: (!) 46 (!) 45 (!) 54 (!) 45  Resp: 18 18 (!) 24 20  Temp: 98 F (36.7 C) 98.2 F (36.8 C) 98.4 F (36.9 C) (!) 97.5 F (36.4 C)  TempSrc: Oral Oral Oral Oral  SpO2: 95% 96% (!) 88% 94%  Weight:      Height:        Intake/Output Summary (Last 24 hours) at 11/14/2022 1017 Last data filed at 11/14/2022 0905 Gross per 24 hour  Intake 720 ml  Output 300 ml  Net 420 ml   Filed Weights   11/12/22 0802 11/12/22 1615  Weight: 130.6 kg 130.6 kg    Telemetry    NSR with 2:1 and third degree HB but without sustained pauses and with narrow complex escape rhythm - Personally Reviewed  ECG    NA - Personally Reviewed  Physical Exam   GEN: No acute distress.   Neck: No  JVD Cardiac: RRR, no murmurs, rubs, or gallops.  Respiratory:      Decreased breath sounds with diffuse wheezing.  GI: Soft, nontender, non-distended  MS: No edema; No deformity. Neuro:  Nonfocal  Psych: Normal affect   Labs    Chemistry Recent Labs  Lab 11/12/22 0842 11/13/22 0604  NA 140 138  K 3.9 3.8  CL 106 106  CO2 25 24  GLUCOSE 245* 191*  BUN 20 17  CREATININE 1.24* 1.04*  CALCIUM 8.5* 8.2*  PROT  --  6.0*  ALBUMIN  --  2.7*  AST  --  9*  ALT  --  9  ALKPHOS  --  101   BILITOT  --  0.9  GFRNONAA 47* 57*  ANIONGAP 9 8     Hematology Recent Labs  Lab 11/12/22 0842 11/13/22 0604  WBC 13.3* 11.7*  RBC 3.40* 3.34*  HGB 10.4* 10.2*  HCT 33.5* 32.8*  MCV 98.5 98.2  MCH 30.6 30.5  MCHC 31.0 31.1  RDW 14.5 14.5  PLT 288 259    Cardiac EnzymesNo results for input(s): "TROPONINI" in the last 168 hours. No results for input(s): "TROPIPOC" in the last 168 hours.   BNPNo results for input(s): "BNP", "PROBNP" in the last 168 hours.   DDimer No results for input(s): "DDIMER" in the last 168 hours.   Radiology    CT CHEST WO CONTRAST  Result Date: 11/12/2022 CLINICAL DATA:  Lung nodule, > 8mm Ground glass nodule F/Up from CT done 4 plus months ago. EXAM: CT CHEST WITHOUT CONTRAST TECHNIQUE: Multidetector CT imaging of the chest was performed following the standard protocol without IV contrast. RADIATION DOSE REDUCTION: This exam was performed according to the departmental dose-optimization program which includes automated exposure control,  adjustment of the mA and/or kV according to patient size and/or use of iterative reconstruction technique. COMPARISON:  07/01/2022 FINDINGS: Cardiovascular: Cardiomegaly. Dense mitral valve and aortic valve calcifications. Extensive coronary artery and scattered aortic calcifications. No aneurysm. Mediastinum/Nodes: No mediastinal, hilar, or axillary adenopathy. Trachea and esophagus are unremarkable. Thyroid unremarkable. Lungs/Pleura: Previously seen ground-glass nodular area in the anterior right upper lobe has resolved. Small solid lingular nodule measures 4 mm on image 79 of series 6, stable. No new or enlarging nodules. Bibasilar atelectasis. No effusions. Upper Abdomen: No acute findings Musculoskeletal: Chest wall soft tissues are unremarkable. No acute bony abnormality. IMPRESSION: Previously seen ground-glass nodular area in the right upper lobe has resolved compatible with and inflammatory process. 4 mm solid lingular  nodule is stable. This could be followed with repeat CT in 18 months to ensure 2 years stability. Coronary artery disease.  Mild cardiomegaly. Aortic Atherosclerosis (ICD10-I70.0). Electronically Signed   By: Charlett Nose M.D.   On: 11/12/2022 20:22    Cardiac Studies   NA  Patient Profile     72 y.o. female with a hx of chronic leg edema/HFpEF(?),  hypertension, hyperlipidemia, DM2, morbid obesity, PAT, Mobitz 2 heart block and intermittent LBBB who is being seen 11/13/2022 for the evaluation of bradycardia at the request of Dr. Natale Milch.   Assessment & Plan    2:1 AV block with occasional third-degree AV block:   She will follow up as an out patient with EP.  She has not had syncope.  No acute therapy this admission.  Discussed briefly with EP.    Chronic leg edema:   Likely multifactorial with obesity and some likelihood of having hypoventilation obesity with elevated pulmonary pressure but not yet RV dysfunction.   No change in therapy.    Hypertension:   I would suggest resuming lisinopril and spironolactone at discharge.     DM2:  Per primary team.     History of PAT: No recurrent tachypalpitations.  Not a candidate for AV nodal blocking agents.   For questions or updates, please contact [facility] Please consult [email or web address] for contact info under Cardiology/STEMI.   Signed, Rollene Rotunda, MD  11/14/2022, 10:17 AM

## 2022-11-14 NOTE — Progress Notes (Signed)
Patient is very uncomfortable on bariatric bed and asks for another type of bed.  Bed changed with six people assisting.  Patient now states good relief with her new bed.

## 2022-11-14 NOTE — TOC Initial Note (Signed)
Transition of Care (TOC) - Initial/Assessment Note  TOC consulted for SNF recommendations. CM at bedside introduced self and explained roll. Patient understands SNF recommendation and states that she is agreeable to go. Patient is from [facility] and states that they do not have any rehab options other that home health. Patient reports that she normally functions independently in her apartment.  Patient states that her first choice is [facility]. CM has completed FL2 and faxed out to for bed offers. Awaiting bed offers. TOC will continue to follow for placement  Patient Details  Name: Jody Simpson MRN: [id number] Date of Birth: 07/31/1950  Transition of Care [facility]) CM/SW Contact:    Beckie Busing, RN Phone Number:[phone]  11/14/2022, 3:30 PM  Clinical Narrative:                     Barriers to Discharge: Continued Medical Work up, SNF Pending bed offer   Patient Goals and CMS Choice            Expected Discharge Plan and Services                                              Prior Living Arrangements/Services                       Activities of Daily Living Home Assistive Devices/Equipment: Wheelchair, Environmental consultant (specify type) ADL Screening (condition at time of admission) Patient's cognitive ability adequate to safely complete daily activities?: Yes Is the patient deaf or have difficulty hearing?: No Does the patient have difficulty seeing, even when wearing glasses/contacts?: No Does the patient have difficulty concentrating, remembering, or making decisions?: No Patient able to express need for assistance with ADLs?: Yes Does the patient have difficulty dressing or bathing?: No Independently performs ADLs?: No Communication: Independent Dressing (OT): Independent Grooming: Independent Feeding: Independent Bathing: Independent Toileting: Independent In/Out Bed: Independent with device (comment) Walks in Home: Independent with  device (comment) (use wheelchair) Does the patient have difficulty walking or climbing stairs?: Yes Weakness of Legs: None Weakness of Arms/Hands: None  Permission Sought/Granted                  Emotional Assessment              Admission diagnosis:  AKI (acute kidney injury) (HCC) [N17.9] Sprain of lateral collateral ligament of right knee, initial encounter [W29.562Z] Patient Active Problem List   Diagnosis Date Noted   AKI (acute kidney injury) (HCC) 11/12/2022   Knee contusion 11/12/2022   Class 3 obesity (HCC) 11/12/2022   Pulmonary nodule 11/12/2022   Sinus bradycardia 11/12/2022   Right knee injury 11/12/2022   Acute respiratory failure with hypoxia (HCC) 07/01/2022   Acute kidney injury superimposed on chronic kidney disease (HCC) 07/01/2022   Asthma, chronic 07/01/2022   Acute respiratory failure (HCC) 07/01/2022   Sepsis secondary to UTI (HCC) 06/15/2022   Trigger finger 12/04/2017   Blepharitis of upper and lower eyelids of both eyes 09/22/2017   Family history of glaucoma 09/22/2017   Nuclear sclerotic cataract of both eyes 09/22/2017   CAP (community acquired pneumonia) 07/03/2016   Obesity-BMI 51 07/03/2016   Fall-down at home x 48 hrs 07/03/2016   PAT (paroxysmal atrial tachycardia) 07/03/2016   Ankle pain, left 10/21/2011   COLONIC POLYPS 09/08/2007   Gout 09/08/2007  GASTROESOPHAGEAL REFLUX DISEASE 09/08/2007   IRRITABLE BOWEL SYNDROME 09/08/2007   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) 07/28/2006   Hyperlipemia 07/28/2006   SOLITARY KIDNEY 07/28/2006   Diabetes mellitus, type 2 (HCC) 07/21/2006   Essential hypertension 07/21/2006   DERMATITIS, ATOPIC 07/21/2006   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 [address] & [facility] [address] [place] [address] Phone: [phone] Fax: [phone]  [facility] - [place], [place] - [address] [place] [address] Phone: [phone] Fax: [phone]     Social Determinants of Health (SDOH) Social History: SDOH Screenings   Food Insecurity: No Food Insecurity (11/12/2022)  Housing: Low Risk  (11/12/2022)  Transportation Needs: No Transportation Needs (11/12/2022)  Utilities: Not At Risk (11/12/2022)  Tobacco Use: Low Risk  (11/13/2022)   SDOH Interventions:     Readmission Risk Interventions    07/02/2022    7:37 AM  Readmission Risk Prevention Plan  Transportation Screening Complete  PCP or Specialist Appt within 5-7 Days Complete  Home Care Screening Complete  Medication Review (RN CM) Complete

## 2022-11-15 DIAGNOSIS — I441 Atrioventricular block, second degree: Secondary | ICD-10-CM | POA: Diagnosis not present

## 2022-11-15 DIAGNOSIS — J45909 Unspecified asthma, uncomplicated: Secondary | ICD-10-CM | POA: Diagnosis not present

## 2022-11-15 DIAGNOSIS — E119 Type 2 diabetes mellitus without complications: Secondary | ICD-10-CM | POA: Diagnosis not present

## 2022-11-15 DIAGNOSIS — N179 Acute kidney failure, unspecified: Secondary | ICD-10-CM | POA: Diagnosis not present

## 2022-11-15 DIAGNOSIS — S83421A Sprain of lateral collateral ligament of right knee, initial encounter: Secondary | ICD-10-CM | POA: Diagnosis not present

## 2022-11-15 LAB — GLUCOSE, CAPILLARY
Glucose-Capillary: 121 mg/dL — ABNORMAL HIGH (ref 70–99)
Glucose-Capillary: 145 mg/dL — ABNORMAL HIGH (ref 70–99)
Glucose-Capillary: 144 mg/dL — ABNORMAL HIGH (ref 70–99)
Glucose-Capillary: 151 mg/dL — ABNORMAL HIGH (ref 70–99)

## 2022-11-15 MED ORDER — IPRATROPIUM-ALBUTEROL 0.5-2.5 (3) MG/3ML IN SOLN
3.0000 mL | RESPIRATORY_TRACT | Status: DC | PRN
  Administered 2022-11-15 – 2022-11-25 (×10): 3 mL via RESPIRATORY_TRACT
  Filled 2022-11-15 (×10): qty 3

## 2022-11-15 NOTE — Progress Notes (Signed)
Progress Note  Patient Name: Jody Simpson Date of Encounter: 11/15/2022  Primary Cardiologist:   Reatha Harps, MD   Subjective   No complaints ordering breakfast.   Inpatient Medications    Scheduled Meds:  allopurinol  300 mg Oral Daily   amLODipine  10 mg Oral Daily   insulin aspart  0-20 Units Subcutaneous TID WC   insulin glargine-yfgn  36 Units Subcutaneous QHS   loratadine  10 mg Oral Daily   montelukast  10 mg Oral QHS   Continuous Infusions:  PRN Meds: acetaminophen **OR** acetaminophen, baclofen, fluticasone, oxyCODONE   Vital Signs    Vitals:   11/14/22 1325 11/14/22 1430 11/14/22 2040 11/15/22 0551  BP: (!) 146/57  (!) 156/55 (!) 138/54  Pulse: (!) 50   (!) 47  Resp: 18  14 14   Temp: 97.6 F (36.4 C)  98.2 F (36.8 C) (!) 97.4 F (36.3 C)  TempSrc: Oral  Oral Oral  SpO2:  91% 97% 95%  Weight:      Height:        Intake/Output Summary (Last 24 hours) at 11/15/2022 0801 Last data filed at 11/15/2022 0559 Gross per 24 hour  Intake 720 ml  Output 1000 ml  Net -280 ml   Filed Weights   11/12/22 0802 11/12/22 1615  Weight: 130.6 kg 130.6 kg    Telemetry    NSR with 2:1 and third degree HB but without sustained pauses and with narrow complex escape rhythm - Personally Reviewed  ECG    NA - Personally Reviewed  Physical Exam   GEN: No acute distress.   Neck: No  JVD Cardiac: RRR, no murmurs, rubs, or gallops.  Respiratory:      Decreased breath sounds with diffuse wheezing.  GI: Soft, nontender, non-distended  MS: No edema; No deformity. Neuro:  Nonfocal  Psych: Normal affect   Labs    Chemistry Recent Labs  Lab 11/12/22 0842 11/13/22 0604  NA 140 138  K 3.9 3.8  CL 106 106  CO2 25 24  GLUCOSE 245* 191*  BUN 20 17  CREATININE 1.24* 1.04*  CALCIUM 8.5* 8.2*  PROT  --  6.0*  ALBUMIN  --  2.7*  AST  --  9*  ALT  --  9  ALKPHOS  --  101  BILITOT  --  0.9  GFRNONAA 47* 57*  ANIONGAP 9 8     Hematology Recent  Labs  Lab 11/12/22 0842 11/13/22 0604  WBC 13.3* 11.7*  RBC 3.40* 3.34*  HGB 10.4* 10.2*  HCT 33.5* 32.8*  MCV 98.5 98.2  MCH 30.6 30.5  MCHC 31.0 31.1  RDW 14.5 14.5  PLT 288 259    Cardiac EnzymesNo results for input(s): "TROPONINI" in the last 168 hours. No results for input(s): "TROPIPOC" in the last 168 hours.   BNPNo results for input(s): "BNP", "PROBNP" in the last 168 hours.   DDimer No results for input(s): "DDIMER" in the last 168 hours.   Radiology    No results found.  Cardiac Studies   NA  Patient Profile     72 y.o. female with a hx of chronic leg edema/HFpEF(?),  hypertension, hyperlipidemia, DM2, morbid obesity, PAT, Mobitz 2 heart block and intermittent LBBB who is being seen 11/13/2022 for the evaluation of bradycardia at the request of Dr. Natale Milch.   Assessment & Plan    2:1 AV block stable and asymptomatic She has never had an outpatient monitor but complains about  tape/adhesives giving her rash will send message to office about getting 30 day hypo allergenic monitor for 30 days and fu with Dr Elberta Fortis EP    Chronic leg edema:   Likely multifactorial with obesity and some likelihood of having hypoventilation obesity with elevated pulmonary pressure but not yet RV dysfunction.   No change in therapy.    Hypertension:   I would suggest resuming lisinopril and spironolactone at discharge.     DM2:  Per primary team.     History of PAT: No recurrent tachypalpitations.  Not a candidate for AV nodal blocking agents. In NSR   Will see again Monday  For questions or updates, please contact [facility] Please consult [email or web address] for contact info under Cardiology/STEMI.   Signed, Charlton Haws, MD  11/15/2022, 8:01 AM

## 2022-11-15 NOTE — Progress Notes (Signed)
PROGRESS NOTE    Jody Simpson  ZOX:[id number] DOB: [birth year] DOA: 11/12/2022 PCP: Theodis Shove, DO   Brief Narrative:  Jody Simpson is a 72 y.o. female with medical history significant of childhood asthma, community-acquired pneumonia, type 2 diabetes, GERD, gout, hypokalemia, hyperlipidemia, hypertension, IBS, renal cell carcinoma, class III obesity, neuropathy, nontraumatic rhabdomyolysis, paroxysmal atrial tachycardia, sinus arrhythmia, history of falls who was brought to the emergency department from her independent living facility due to having a mechanical/accidental fall and injuring her right knee while she was trying to transfer from her wheelchair to the recliner and slipped while transitioning.  Patient admitted overnight for elevated creatinine which is now resolved.  PT evaluating recommending ongoing rehab. Curbside discussion with EP per Cardiology consult - poor candidate for pacemaker given poor baseline functional status and lack of symptoms. May benefit from outpatient monitoring if she begins to have symptoms. Patient is medically stable for discharge but will likely need workup for safe disposition to either skilled nursing facility or back to her independent living facility if they have a rehab facility on campus.  Assessment & Plan:   Principal Problem:   AKI (acute kidney injury) (HCC) Active Problems:   Diabetes mellitus, type 2 (HCC)   Hyperlipemia   Gout   Essential hypertension   PAT (paroxysmal atrial tachycardia)   Knee contusion   Class 3 obesity (HCC)   Pulmonary nodule   Sinus bradycardia   Right knee injury   AKI (acute kidney injury) (HCC) Prerenal resolved overnight, continue to increase p.o. intake as appropriate Resume home medications at discharge  Mechanical fall Ambulatory dysfunction, acute on chronic right knee pain with negative imaging PT following recommending ongoing rehab Patient continues to have extremely poor  ambulatory status, remains high risk for fall even with support   Sinus bradycardia, 2-1 AV block with transient third-degree AV block. Cardiology following, discussed with the EP - not candidate due to poor functional ability/lack of symptoms Patient should avoid AV nodal blocking agents Patient has failed multiple outpatient cardiac monitor attempts due to intolerance of adhesive   Diabetes mellitus, type 2 (HCC) Carbohydrate modified diet. Continue sliding scale, hypoglycemic protocol, 36 unit glargine daily(home dose)   Hyperlipemia Currently not on therapy. Follow-up with PCP.   Gout Continue allopurinol 300 mg p.o. daily.   Essential hypertension Continue amlodipine 10 mg p.o. daily.   Class 3 obesity (HCC) Current BMI 46.48 kg/m. Lifestyle modifications discussed at length. Follow-up with PCP and/or bariatric clinic for ongoing discussion.   History of pulmonary nodule incidentally noted  Repeat CT here "4 mm solid lingular nodule is stable. This could be followed with repeat CT in 18 months to ensure 2 years stability".  DVT prophylaxis: SCDs Start: 11/12/22 1258 Code Status:   Code Status: Full Code Family Communication: None present  Status is: OBS  Dispo: The patient is from: Independent living              Anticipated d/c is to: SNF              Anticipated d/c date is: Imminent, medically stable for discharge awaiting safe disposition              Patient currently is medically stable for discharge  Consultants:  Cardiology  Procedures:  None  Antimicrobials:  None  Subjective: No acute issues or events overnight, patient requesting increase activity level which is certainly reasonable, otherwise denies nausea vomiting diarrhea constipation headache fevers chills or chest pain.  Objective: Vitals:   11/14/22 1325 11/14/22 1430 11/14/22 2040 11/15/22 0551  BP: (!) 146/57  (!) 156/55 (!) 138/54  Pulse: (!) 50   (!) 47  Resp: 18  14 14   Temp: 97.6  F (36.4 C)  98.2 F (36.8 C) (!) 97.4 F (36.3 C)  TempSrc: Oral  Oral Oral  SpO2:  91% 97% 95%  Weight:      Height:        Intake/Output Summary (Last 24 hours) at 11/15/2022 0727 Last data filed at 11/15/2022 0559 Gross per 24 hour  Intake 720 ml  Output 1000 ml  Net -280 ml    Filed Weights   11/12/22 0802 11/12/22 1615  Weight: 130.6 kg 130.6 kg    Examination:  General:  Pleasantly resting in bed, No acute distress. HEENT:  Normocephalic atraumatic.  Sclerae nonicteric, noninjected.  Extraocular movements intact bilaterally. Neck:  Without mass or deformity.  Trachea is midline. Lungs:  Without rhonchi, wheeze, or rales. Heart:  Regular rate and rhythm.  Abdomen:  Soft, nontender, obese, nondistended.  Without guarding or rebound. Extremities: Without cyanosis, clubbing  Data Reviewed: I have personally reviewed following labs and imaging studies  CBC: Recent Labs  Lab 11/12/22 0842 11/13/22 0604  WBC 13.3* 11.7*  NEUTROABS 10.8*  --   HGB 10.4* 10.2*  HCT 33.5* 32.8*  MCV 98.5 98.2  PLT 288 259    Basic Metabolic Panel: Recent Labs  Lab 11/12/22 0842 11/13/22 0604  NA 140 138  K 3.9 3.8  CL 106 106  CO2 25 24  GLUCOSE 245* 191*  BUN 20 17  CREATININE 1.24* 1.04*  CALCIUM 8.5* 8.2*    GFR: Estimated Creatinine Clearance: 68.8 mL/min (A) (by C-G formula based on SCr of 1.04 mg/dL (H)). Liver Function Tests: Recent Labs  Lab 11/13/22 0604  AST 9*  ALT 9  ALKPHOS 101  BILITOT 0.9  PROT 6.0*  ALBUMIN 2.7*    No results for input(s): "LIPASE", "AMYLASE" in the last 168 hours. No results for input(s): "AMMONIA" in the last 168 hours. Coagulation Profile: No results for input(s): "INR", "PROTIME" in the last 168 hours. Cardiac Enzymes: No results for input(s): "CKTOTAL", "CKMB", "CKMBINDEX", "TROPONINI" in the last 168 hours. BNP (last 3 results) No results for input(s): "PROBNP" in the last 8760 hours. HbA1C: No results for  input(s): "HGBA1C" in the last 72 hours. CBG: Recent Labs  Lab 11/14/22 0734 11/14/22 1139 11/14/22 1628 11/14/22 2040 11/15/22 0722  GLUCAP 141* 152* 154* 132* 121*    Lipid Profile: No results for input(s): "CHOL", "HDL", "LDLCALC", "TRIG", "CHOLHDL", "LDLDIRECT" in the last 72 hours. Thyroid Function Tests: No results for input(s): "TSH", "T4TOTAL", "FREET4", "T3FREE", "THYROIDAB" in the last 72 hours. Anemia Panel: No results for input(s): "VITAMINB12", "FOLATE", "FERRITIN", "TIBC", "IRON", "RETICCTPCT" in the last 72 hours. Sepsis Labs: No results for input(s): "PROCALCITON", "LATICACIDVEN" in the last 168 hours.  No results found for this or any previous visit (from the past 240 hour(s)).       Radiology Studies: No results found.  Scheduled Meds:  allopurinol  300 mg Oral Daily   amLODipine  10 mg Oral Daily   insulin aspart  0-20 Units Subcutaneous TID WC   insulin glargine-yfgn  36 Units Subcutaneous QHS   loratadine  10 mg Oral Daily   montelukast  10 mg Oral QHS   Continuous Infusions:     LOS: 0 days   Time spent:  Azucena Fallen, DO  [facility]  If 7PM-7AM, please contact night-coverage [email or web address]  11/15/2022, 7:27 AM

## 2022-11-16 DIAGNOSIS — N179 Acute kidney failure, unspecified: Secondary | ICD-10-CM | POA: Diagnosis not present

## 2022-11-16 LAB — GLUCOSE, CAPILLARY
Glucose-Capillary: 117 mg/dL — ABNORMAL HIGH (ref 70–99)
Glucose-Capillary: 159 mg/dL — ABNORMAL HIGH (ref 70–99)
Glucose-Capillary: 122 mg/dL — ABNORMAL HIGH (ref 70–99)
Glucose-Capillary: 156 mg/dL — ABNORMAL HIGH (ref 70–99)

## 2022-11-16 NOTE — Progress Notes (Signed)
PROGRESS NOTE    Jody Simpson  ZOX:[id number] DOB: December 09, 1950 DOA: 11/12/2022 PCP: Theodis Shove, DO   Brief Narrative:  Jody Simpson is a 72 y.o. female with medical history significant of childhood asthma, community-acquired pneumonia, type 2 diabetes, GERD, gout, hypokalemia, hyperlipidemia, hypertension, IBS, renal cell carcinoma, class III obesity, neuropathy, nontraumatic rhabdomyolysis, paroxysmal atrial tachycardia, sinus arrhythmia, history of falls who was brought to the emergency department from her independent living facility due to having a mechanical/accidental fall and injuring her right knee while she was trying to transfer from her wheelchair to the recliner and slipped while transitioning.  Patient admitted overnight for elevated creatinine which is now resolved.  PT evaluating recommending ongoing rehab. Curbside discussion with EP per Cardiology consult - poor candidate for pacemaker given poor baseline functional status and lack of symptoms. May benefit from outpatient monitoring if she begins to have symptoms. Patient is medically stable for discharge but will likely need workup for safe disposition to either skilled nursing facility or back to her independent living facility if they have a rehab facility on campus.  Assessment & Plan:   Principal Problem:   AKI (acute kidney injury) (HCC) Active Problems:   Diabetes mellitus, type 2 (HCC)   Hyperlipemia   Gout   Essential hypertension   PAT (paroxysmal atrial tachycardia)   Knee contusion   Class 3 obesity (HCC)   Pulmonary nodule   Sinus bradycardia   Right knee injury   AKI (acute kidney injury) (HCC) Prerenal resolved overnight, continue to increase p.o. intake as appropriate Resume home medications at discharge  Mechanical fall Ambulatory dysfunction, acute on chronic right knee pain with negative imaging PT following recommending ongoing rehab Patient continues to have extremely poor  ambulatory status, remains high risk for fall even with support   Sinus bradycardia, 2-1 AV block with transient third-degree AV block. Cardiology following, discussed with the EP - not candidate due to poor functional ability/lack of symptoms Patient should avoid AV nodal blocking agents Patient has failed multiple outpatient cardiac monitor attempts due to intolerance of adhesive   Diabetes mellitus, type 2 (HCC) Carbohydrate modified diet. Continue sliding scale, hypoglycemic protocol, 36 unit glargine daily(home dose)   Hyperlipemia Currently not on therapy. Follow-up with PCP.   Gout Continue allopurinol 300 mg p.o. daily.   Essential hypertension Continue amlodipine 10 mg p.o. daily.   Class 3 obesity (HCC) Current BMI 46.48 kg/m. Lifestyle modifications discussed at length. Follow-up with PCP and/or bariatric clinic for ongoing discussion.   History of pulmonary nodule incidentally noted  Repeat CT here "4 mm solid lingular nodule is stable. This could be followed with repeat CT in 18 months to ensure 2 years stability".  DVT prophylaxis: SCDs Start: 11/12/22 1258 Code Status:   Code Status: Full Code Family Communication: None present  Status is: OBS  Dispo: The patient is from: Independent living              Anticipated d/c is to: SNF              Anticipated d/c date is: Imminent, medically stable for discharge awaiting safe disposition              Patient currently is medically stable for discharge  Consultants:  Cardiology  Procedures:  None  Antimicrobials:  None  Subjective: No acute issues or events overnight, patient requesting increase activity level which is certainly reasonable, otherwise denies nausea vomiting diarrhea constipation headache fevers chills or chest pain.  Objective: Vitals:   11/15/22 1307 11/15/22 2115 11/15/22 2204 11/16/22 0502  BP:  (!) 151/58  (!) 147/61  Pulse:  (!) 49  (!) 55  Resp:  17  14  Temp:  98 F (36.7 C)   97.9 F (36.6 C)  TempSrc:  Oral  Oral  SpO2: 94% 94% 90% 96%  Weight:      Height:        Intake/Output Summary (Last 24 hours) at 11/16/2022 0749 Last data filed at 11/16/2022 0506 Gross per 24 hour  Intake 480 ml  Output 750 ml  Net -270 ml    Filed Weights   11/12/22 0802 11/12/22 1615  Weight: 130.6 kg 130.6 kg    Examination:  General:  Pleasantly resting in bed, No acute distress. HEENT:  Normocephalic atraumatic.  Sclerae nonicteric, noninjected.  Extraocular movements intact bilaterally. Neck:  Without mass or deformity.  Trachea is midline. Lungs:  Without rhonchi, wheeze, or rales. Heart:  Regular rate and rhythm.  Abdomen:  Soft, nontender, obese, nondistended.  Without guarding or rebound. Extremities: Without cyanosis, clubbing  Data Reviewed: I have personally reviewed following labs and imaging studies  CBC: Recent Labs  Lab 11/12/22 0842 11/13/22 0604  WBC 13.3* 11.7*  NEUTROABS 10.8*  --   HGB 10.4* 10.2*  HCT 33.5* 32.8*  MCV 98.5 98.2  PLT 288 259    Basic Metabolic Panel: Recent Labs  Lab 11/12/22 0842 11/13/22 0604  NA 140 138  K 3.9 3.8  CL 106 106  CO2 25 24  GLUCOSE 245* 191*  BUN 20 17  CREATININE 1.24* 1.04*  CALCIUM 8.5* 8.2*    GFR: Estimated Creatinine Clearance: 68.8 mL/min (A) (by C-G formula based on SCr of 1.04 mg/dL (H)). Liver Function Tests: Recent Labs  Lab 11/13/22 0604  AST 9*  ALT 9  ALKPHOS 101  BILITOT 0.9  PROT 6.0*  ALBUMIN 2.7*    No results for input(s): "LIPASE", "AMYLASE" in the last 168 hours. No results for input(s): "AMMONIA" in the last 168 hours. Coagulation Profile: No results for input(s): "INR", "PROTIME" in the last 168 hours. Cardiac Enzymes: No results for input(s): "CKTOTAL", "CKMB", "CKMBINDEX", "TROPONINI" in the last 168 hours. BNP (last 3 results) No results for input(s): "PROBNP" in the last 8760 hours. HbA1C: No results for input(s): "HGBA1C" in the last 72  hours. CBG: Recent Labs  Lab 11/15/22 0722 11/15/22 1137 11/15/22 1717 11/15/22 2124 11/16/22 0731  GLUCAP 121* 145* 144* 151* 117*    Lipid Profile: No results for input(s): "CHOL", "HDL", "LDLCALC", "TRIG", "CHOLHDL", "LDLDIRECT" in the last 72 hours. Thyroid Function Tests: No results for input(s): "TSH", "T4TOTAL", "FREET4", "T3FREE", "THYROIDAB" in the last 72 hours. Anemia Panel: No results for input(s): "VITAMINB12", "FOLATE", "FERRITIN", "TIBC", "IRON", "RETICCTPCT" in the last 72 hours. Sepsis Labs: No results for input(s): "PROCALCITON", "LATICACIDVEN" in the last 168 hours.  No results found for this or any previous visit (from the past 240 hour(s)).       Radiology Studies: No results found.  Scheduled Meds:  allopurinol  300 mg Oral Daily   amLODipine  10 mg Oral Daily   insulin aspart  0-20 Units Subcutaneous TID WC   insulin glargine-yfgn  36 Units Subcutaneous QHS   loratadine  10 mg Oral Daily   montelukast  10 mg Oral QHS   Continuous Infusions:     LOS: 0 days   Time spent:  Azucena Fallen, DO [facility]  If 7PM-7AM,  please contact night-coverage [email or web address]  11/16/2022, 7:49 AM

## 2022-11-17 ENCOUNTER — Other Ambulatory Visit: Payer: Self-pay | Admitting: Cardiology

## 2022-11-17 DIAGNOSIS — I441 Atrioventricular block, second degree: Secondary | ICD-10-CM

## 2022-11-17 DIAGNOSIS — N179 Acute kidney failure, unspecified: Secondary | ICD-10-CM | POA: Diagnosis not present

## 2022-11-17 LAB — GLUCOSE, CAPILLARY
Glucose-Capillary: 110 mg/dL — ABNORMAL HIGH (ref 70–99)
Glucose-Capillary: 144 mg/dL — ABNORMAL HIGH (ref 70–99)
Glucose-Capillary: 127 mg/dL — ABNORMAL HIGH (ref 70–99)
Glucose-Capillary: 115 mg/dL — ABNORMAL HIGH (ref 70–99)

## 2022-11-17 NOTE — Discharge Instructions (Signed)
   Heart Monitor:   Length of Wear: 30 days   Your monitor will be mailed to your home address within 3-5 business days. However, if you have not received your monitor after 5 business days please send [place] a MyChart message or call the office at [phone], so we may follow up on this for you.    Your physician has recommended that you wear a Zio AT monitor.    This monitor is a medical device that records the heart's electrical activity. Doctors most often use these monitors to diagnose arrhythmias. Arrhythmias are problems with the speed or rhythm of the heartbeat. The monitor is a small device applied to your chest. You can wear one while you do your normal daily activities. While wearing this monitor if you have any symptoms to push the button and record what you felt. Once you have worn this monitor for the period of time provider prescribed 30 days, you will return the monitor device in the postage paid box. Once it is returned they will download the data collected and provide [place] with a report which the provider will then review and we will call you with those results. Important tips:   1. Avoid showering during the first 24 hours of wearing the monitor. 2. Avoid excessive sweating to help maximize wear time. 3. Do not submerge the device, no hot tubs, and no swimming pools. 4. Keep any lotions or oils away from the patch. 5. After 24 hours you may shower with the patch on. Take brief showers with your back facing the shower head.  6. Do not remove patch once it has been placed because that will interrupt data and decrease adhesive wear time. 7. Push the button when you have any symptoms and write down what you were feeling. 8. Once you have completed wearing your monitor, remove and place into box which has postage paid and place in your outgoing mailbox.  9. If for some reason you have misplaced your box then call our office and we can provide another box and/or mail it off for you.     We will have you see our EP physicians as well - their office will call with appt date and time.

## 2022-11-17 NOTE — TOC Progression Note (Addendum)
Transition of Care [facility]) - Progression Note    Patient Details  Name: Jody Simpson MRN: [id number] Date of Birth: [birth year]  Transition of Care [facility] CM/SW Contact  Beckie Busing, RN Phone Number:[phone]  11/17/2022, 4:09 PM  Clinical Narrative:    Cm at bed side to update patient on bed offers. Patient really wants [facility] and states that she has been told that [facility] has beds avaliable.Patient states that she will not go to low star rating facility and would like to wait to hear from [facility]. CM attempted to call [facility]. Brittney admissions director is on vacation. CM has been forwarded to [employer] SW. There is no answer voicemail has been left. Patient has been updated/ CM to initiate insurance auth.      Barriers to Discharge: Continued Medical Work up, SNF Pending bed offer  Expected Discharge Plan and Services                                               Social Determinants of Health (SDOH) Interventions SDOH Screenings   Food Insecurity: No Food Insecurity (11/12/2022)  Housing: Low Risk  (11/12/2022)  Transportation Needs: No Transportation Needs (11/12/2022)  Utilities: Not At Risk (11/12/2022)  Tobacco Use: Low Risk  (11/13/2022)    Readmission Risk Interventions    07/02/2022    7:37 AM  Readmission Risk Prevention Plan  Transportation Screening Complete  PCP or Specialist Appt within 5-7 Days Complete  Home Care Screening Complete  Medication Review (RN CM) Complete

## 2022-11-17 NOTE — Progress Notes (Signed)
PROGRESS NOTE    AHNIA Simpson  ZOX:[id number] DOB: Aug 21, 1950 DOA: 11/12/2022 PCP: Theodis Shove, DO   Brief Narrative:  Jody Simpson is a 72 y.o. female with medical history significant of childhood asthma, community-acquired pneumonia, type 2 diabetes, GERD, gout, hypokalemia, hyperlipidemia, hypertension, IBS, renal cell carcinoma, class III obesity, neuropathy, nontraumatic rhabdomyolysis, paroxysmal atrial tachycardia, sinus arrhythmia, history of falls who was brought to the emergency department from her independent living facility due to having a mechanical/accidental fall and injuring her right knee while she was trying to transfer from her wheelchair to the recliner and slipped while transitioning.  Patient admitted overnight for elevated creatinine which is now resolved.  PT evaluating recommending ongoing rehab. Curbside discussion with EP per Cardiology consult - poor candidate for pacemaker given poor baseline functional status and lack of symptoms. May benefit from outpatient monitoring if she begins to have symptoms. Patient is medically stable for discharge but will likely need workup for safe disposition to either skilled nursing facility or back to her independent living facility if they have a rehab facility on campus.  Assessment & Plan:   Principal Problem:   AKI (acute kidney injury) (HCC) Active Problems:   Diabetes mellitus, type 2 (HCC)   Hyperlipemia   Gout   Essential hypertension   PAT (paroxysmal atrial tachycardia)   Knee contusion   Class 3 obesity (HCC)   Pulmonary nodule   Sinus bradycardia   Right knee injury   AKI (acute kidney injury) (HCC) Prerenal resolved overnight, continue to increase p.o. intake as appropriate Resume home medications at discharge  Mechanical fall Ambulatory dysfunction, acute on chronic right knee pain with negative imaging PT following recommending ongoing rehab Patient continues to have extremely poor  ambulatory status, remains high risk for fall even with support   Sinus bradycardia, 2-1 AV block with transient third-degree AV block. Cardiology following, discussed with the EP - not candidate due to poor functional ability/lack of symptoms Patient should avoid AV nodal blocking agents Patient has failed multiple outpatient cardiac monitor attempts due to intolerance of adhesive   Diabetes mellitus, type 2 (HCC) Carbohydrate modified diet. Continue sliding scale, hypoglycemic protocol, 36 unit glargine daily(home dose)   Hyperlipemia Currently not on therapy. Follow-up with PCP.   Gout Continue allopurinol 300 mg p.o. daily.   Essential hypertension Continue amlodipine 10 mg p.o. daily.   Class 3 obesity (HCC) Current BMI 46.48 kg/m. Lifestyle modifications discussed at length. Follow-up with PCP and/or bariatric clinic for ongoing discussion.   History of pulmonary nodule incidentally noted  Repeat CT here "4 mm solid lingular nodule is stable. This could be followed with repeat CT in 18 months to ensure 2 years stability".  DVT prophylaxis: SCDs Start: 11/12/22 1258 Code Status:   Code Status: Full Code Family Communication: None present  Status is: OBS  Dispo: The patient is from: Independent living              Anticipated d/c is to: SNF              Anticipated d/c date is: Imminent, medically stable for discharge awaiting safe disposition              Patient currently is medically stable for discharge  Consultants:  Cardiology  Procedures:  None  Antimicrobials:  None  Subjective: No acute issues or events overnight, patient requesting increase activity level which is certainly reasonable, otherwise denies nausea vomiting diarrhea constipation headache fevers chills or chest pain.  Objective: Vitals:   11/16/22 1355 11/16/22 2132 11/17/22 0558 11/17/22 0812  BP: 132/61 (!) 155/51 (!) 152/63   Pulse: (!) 54 (!) 58 (!) 56   Resp: 18 16 14    Temp:  97.6 F (36.4 C) 98.5 F (36.9 C) 98.4 F (36.9 C)   TempSrc: Oral Oral Oral   SpO2: 96% 92% 93% 93%  Weight:      Height:        Intake/Output Summary (Last 24 hours) at 11/17/2022 1328 Last data filed at 11/17/2022 0900 Gross per 24 hour  Intake 714 ml  Output 1000 ml  Net -286 ml    Filed Weights   11/12/22 0802 11/12/22 1615  Weight: 130.6 kg 130.6 kg    Examination:  General:  Pleasantly resting in bed, No acute distress. HEENT:  Normocephalic atraumatic.  Sclerae nonicteric, noninjected.  Extraocular movements intact bilaterally. Neck:  Without mass or deformity.  Trachea is midline. Lungs:  Without rhonchi, wheeze, or rales. Heart:  Regular rate and rhythm.  Abdomen:  Soft, nontender, obese, nondistended.  Without guarding or rebound. Extremities: Without cyanosis, clubbing  Data Reviewed: I have personally reviewed following labs and imaging studies  CBC: Recent Labs  Lab 11/12/22 0842 11/13/22 0604  WBC 13.3* 11.7*  NEUTROABS 10.8*  --   HGB 10.4* 10.2*  HCT 33.5* 32.8*  MCV 98.5 98.2  PLT 288 259    Basic Metabolic Panel: Recent Labs  Lab 11/12/22 0842 11/13/22 0604  NA 140 138  K 3.9 3.8  CL 106 106  CO2 25 24  GLUCOSE 245* 191*  BUN 20 17  CREATININE 1.24* 1.04*  CALCIUM 8.5* 8.2*    GFR: Estimated Creatinine Clearance: 68.8 mL/min (A) (by C-G formula based on SCr of 1.04 mg/dL (H)). Liver Function Tests: Recent Labs  Lab 11/13/22 0604  AST 9*  ALT 9  ALKPHOS 101  BILITOT 0.9  PROT 6.0*  ALBUMIN 2.7*    No results for input(s): "LIPASE", "AMYLASE" in the last 168 hours. No results for input(s): "AMMONIA" in the last 168 hours. Coagulation Profile: No results for input(s): "INR", "PROTIME" in the last 168 hours. Cardiac Enzymes: No results for input(s): "CKTOTAL", "CKMB", "CKMBINDEX", "TROPONINI" in the last 168 hours. BNP (last 3 results) No results for input(s): "PROBNP" in the last 8760 hours. HbA1C: No results for  input(s): "HGBA1C" in the last 72 hours. CBG: Recent Labs  Lab 11/16/22 1111 11/16/22 1635 11/16/22 2133 11/17/22 0721 11/17/22 1201  GLUCAP 159* 122* 156* 110* 144*    Lipid Profile: No results for input(s): "CHOL", "HDL", "LDLCALC", "TRIG", "CHOLHDL", "LDLDIRECT" in the last 72 hours. Thyroid Function Tests: No results for input(s): "TSH", "T4TOTAL", "FREET4", "T3FREE", "THYROIDAB" in the last 72 hours. Anemia Panel: No results for input(s): "VITAMINB12", "FOLATE", "FERRITIN", "TIBC", "IRON", "RETICCTPCT" in the last 72 hours. Sepsis Labs: No results for input(s): "PROCALCITON", "LATICACIDVEN" in the last 168 hours.  No results found for this or any previous visit (from the past 240 hour(s)).       Radiology Studies: No results found.  Scheduled Meds:  allopurinol  300 mg Oral Daily   amLODipine  10 mg Oral Daily   insulin aspart  0-20 Units Subcutaneous TID WC   insulin glargine-yfgn  36 Units Subcutaneous QHS   loratadine  10 mg Oral Daily   montelukast  10 mg Oral QHS   Continuous Infusions:     LOS: 0 days   Time spent:  Azucena Fallen, DO [facility]  If 7PM-7AM, please contact night-coverage [email or web address]  11/17/2022, 1:28 PM

## 2022-11-17 NOTE — Progress Notes (Signed)
   Arranged event monitor and EP will call for follow up appt.   Nada Boozer, FNP-C At [facility]  Pgr:[phone] or after 5pm and on weekends call [phone] 11/17/2022.

## 2022-11-17 NOTE — Progress Notes (Signed)
Physical Therapy Treatment Patient Details Name: Jody Simpson MRN: [id number] DOB: May 24, 1951 Today's Date: 11/17/2022   History of Present Illness 72 yo morbidly obese female with significant history of diabetes, HTN, asthma, IBS, neuropathy, morbid obestiy, brought here via EMS from a senior living facility for evaluation of a fall.    PT Comments    The patient is tearful about  having  returned to Hospital and now unable to transfer as patient was  independent with transfers.  Patient finally able to  partially stand  from bed x 3 with +2 max assistance  when using WC  placed in front of  patient and bearing weight through arms  to stand  but leaning over forward.   Recommendations for follow up therapy are one component of a multi-disciplinary discharge planning process, led by the attending physician.  Recommendations may be updated based on patient status, additional functional criteria and insurance authorization.  Follow Up Recommendations  Can patient physically be transported by private vehicle: No    Assistance Recommended at Discharge Frequent or constant Supervision/Assistance  Patient can return home with the following Two people to help with walking and/or transfers;Two people to help with bathing/dressing/bathroom;Assistance with cooking/housework;Assist for transportation   Equipment Recommendations  None recommended by PT    Recommendations for Other Services       Precautions / Restrictions Precautions Precautions: Fall Restrictions Weight Bearing Restrictions: No     Mobility  Bed Mobility   Bed Mobility: Supine to Sit, Sit to Supine     Supine to sit: Min assist Sit to supine: Max assist, +2 for physical assistance   General bed mobility comments: min A to upright trunk into sitting EOB with strong use, max A +2 to lift BLE and manage trunk back into supine    Transfers Overall transfer level: Needs assistance   Transfers: Sit to/from  Stand Sit to Stand: +2 physical assistance, +2 safety/equipment, From elevated surface           General transfer comment: patient wanted to attempt tansfer to WC, WC oplaced infront of patient per pt. request. Attempted x 3 to stnad with ptient pushing from bed  but unable to rise, had patient  place hands on WC seat and  push up  with +2 max to power up to stand, use of bed pad to power up. Patient  stood with UE's propped on  WC. Patient performed this x 3, also needed cleaning from BM.    Ambulation/Gait                   Stairs             Wheelchair Mobility    Modified Rankin (Stroke Patients Only)       Balance Overall balance assessment: Needs assistance, History of Falls Sitting-balance support: Feet supported Sitting balance-[name]: Good         Standing balance comment: relies on UE support and leaning forward over WC.                            Cognition Arousal/Alertness: Awake/alert Behavior During Therapy: WFL for tasks assessed/performed Overall Cognitive Status: Within Functional Limits for tasks assessed                                 General Comments: tearful about being back into hospital  Exercises      General Comments        Pertinent Vitals/Pain Pain Assessment Pain Score: 4  Pain Location: R knee Pain Descriptors / Indicators: Tender, Discomfort, Sore Pain Intervention(s): Limited activity within patient's tolerance, Monitored during session    Home Living                          Prior Function            PT Goals (current goals can now be found in the care plan section) Progress towards PT goals: Progressing toward goals    Frequency    Min 1X/week      PT Plan Current plan remains appropriate    Co-evaluation              AM-PAC PT "6 Clicks" Mobility   Outcome Measure  Help needed turning from your back to your side while in a flat bed  without using bedrails?: A Lot Help needed moving from lying on your back to sitting on the side of a flat bed without using bedrails?: Total Help needed moving to and from a bed to a chair (including a wheelchair)?: Total Help needed standing up from a chair using your arms (e.g., wheelchair or bedside chair)?: Total Help needed to walk in hospital room?: Total Help needed climbing 3-5 steps with a railing? : Total 6 Click Score: 7    End of Session Equipment Utilized During Treatment: Gait belt Activity Tolerance: Patient tolerated treatment well;Patient limited by pain Patient left: in bed;with call bell/phone within reach;with nursing/sitter in room;with bed alarm set Nurse Communication: Mobility status;Need for lift equipment PT Visit Diagnosis: Other abnormalities of gait and mobility (R26.89);Muscle weakness (generalized) (M62.81);History of falling (Z91.81);Pain Pain - Right/Left: Right Pain - part of body: Knee     Time: 1050-1140 PT Time Calculation (min) (ACUTE ONLY): 50 min  Charges:  $Therapeutic Activity: 38-52 mins                     Blanchard Kelch PT Acute Rehabilitation Services Office [phone] Weekend [phone]    Rada Hay 11/17/2022, 1:33 PM

## 2022-11-18 ENCOUNTER — Ambulatory Visit: Payer: Medicare HMO | Admitting: General Practice

## 2022-11-18 DIAGNOSIS — N179 Acute kidney failure, unspecified: Secondary | ICD-10-CM | POA: Diagnosis not present

## 2022-11-18 LAB — GLUCOSE, CAPILLARY
Glucose-Capillary: 136 mg/dL — ABNORMAL HIGH (ref 70–99)
Glucose-Capillary: 121 mg/dL — ABNORMAL HIGH (ref 70–99)
Glucose-Capillary: 152 mg/dL — ABNORMAL HIGH (ref 70–99)
Glucose-Capillary: 144 mg/dL — ABNORMAL HIGH (ref 70–99)
Glucose-Capillary: 165 mg/dL — ABNORMAL HIGH (ref 70–99)

## 2022-11-18 NOTE — Progress Notes (Signed)
PROGRESS NOTE    Jody Simpson  WUJ:[id number] DOB: Apr 10, 1951 DOA: 11/12/2022 PCP: Theodis Shove, DO   Brief Narrative:  Jody Simpson is a 72 y.o. female with medical history significant of childhood asthma, community-acquired pneumonia, type 2 diabetes, GERD, gout, hypokalemia, hyperlipidemia, hypertension, IBS, renal cell carcinoma, class III obesity, neuropathy, nontraumatic rhabdomyolysis, paroxysmal atrial tachycardia, sinus arrhythmia, history of falls who was brought to the emergency department from her independent living facility due to having a mechanical/accidental fall and injuring her right knee while she was trying to transfer from her wheelchair to the recliner and slipped while transitioning.  Patient admitted overnight for elevated creatinine which is now resolved.  PT evaluating recommending ongoing rehab. Curbside discussion with EP per Cardiology consult - poor candidate for pacemaker given poor baseline functional status and lack of symptoms. May benefit from outpatient monitoring if she begins to have symptoms. Patient remains medically stable for discharge -patient currently has bed offers, awaiting decision by patient and approval from insurance for transition to ongoing rehab at SNF  Assessment & Plan:   Principal Problem:   AKI (acute kidney injury) (HCC) Active Problems:   Diabetes mellitus, type 2 (HCC)   Hyperlipemia   Gout   Essential hypertension   PAT (paroxysmal atrial tachycardia)   Knee contusion   Class 3 obesity (HCC)   Pulmonary nodule   Sinus bradycardia   Right knee injury   AKI (acute kidney injury) (HCC), resolved Prerenal resolved within the first 24 hours, continue to increase p.o. intake as appropriate Continue to hold spironolactone, lisinopril at discharge  Mechanical fall Ambulatory dysfunction, acute on chronic right knee pain with negative imaging PT following recommending ongoing rehab Patient continues to have  extremely poor ambulatory status, remains high risk for fall even with support   Sinus bradycardia, 2-1 AV block with transient third-degree AV block. Cardiology following, discussed with the EP - not candidate due to poor functional ability/lack of symptoms Patient should avoid AV nodal blocking agents Patient has failed multiple outpatient cardiac monitor attempts due to intolerance of adhesive -cardiology will attempt a repeat outpatient cardiac monitor from a different company in hopes she is not intolerant to their adhesive   Diabetes mellitus, type 2 (HCC) Carbohydrate modified diet. Continue sliding scale, hypoglycemic protocol, 36 unit glargine daily(home dose)   Hyperlipemia Currently not on therapy. Follow-up with PCP.   Gout Continue allopurinol 300 mg p.o. daily.   Essential hypertension Continue amlodipine 10 mg p.o. daily only Lisinopril and spironolactone discontinued given above, would not resume at discharge given patient is currently well-controlled   Class 3 obesity (HCC) Current BMI 46.48 kg/m. Lifestyle modifications discussed at length. Follow-up with PCP and/or bariatric clinic for ongoing discussion.   History of pulmonary nodule incidentally noted  Repeat CT here "4 mm solid lingular nodule is stable. This could be followed with repeat CT in 18 months to ensure 2 years stability".  DVT prophylaxis: SCDs Start: 11/12/22 1258 Code Status:   Code Status: Full Code Family Communication: None present  Status is: OBS  Dispo: The patient is from: Independent living              Anticipated d/c is to: SNF              Anticipated d/c date is: Imminent, medically stable for discharge awaiting safe disposition              Patient currently is medically stable for discharge  Consultants:  Cardiology  Procedures:  None  Antimicrobials:  None  Subjective: No acute issues or events overnight, patient requesting increase activity level which is certainly  reasonable, otherwise denies nausea vomiting diarrhea constipation headache fevers chills or chest pain.  Objective: Vitals:   11/17/22 1339 11/17/22 2050 11/17/22 2127 11/18/22 0600  BP: (!) 143/75  (!) 146/56 (!) 144/56  Pulse: 69  60 (!) 58  Resp: 18  14 14   Temp: 98.5 F (36.9 C)  97.8 F (36.6 C)   TempSrc: Oral  Oral   SpO2: 98% 92% 93% 98%  Weight:      Height:        Intake/Output Summary (Last 24 hours) at 11/18/2022 0737 Last data filed at 11/17/2022 1755 Gross per 24 hour  Intake 474 ml  Output 600 ml  Net -126 ml    Filed Weights   11/12/22 0802 11/12/22 1615  Weight: 130.6 kg 130.6 kg    Examination:  General:  Pleasantly resting in bed, No acute distress. HEENT:  Normocephalic atraumatic.  Sclerae nonicteric, noninjected.  Extraocular movements intact bilaterally. Neck:  Without mass or deformity.  Trachea is midline. Lungs:  Without rhonchi, wheeze, or rales. Heart:  Regular rate and rhythm.  Abdomen:  Soft, nontender, obese, nondistended.  Without guarding or rebound. Extremities: Without cyanosis, clubbing  Data Reviewed: I have personally reviewed following labs and imaging studies  CBC: Recent Labs  Lab 11/12/22 0842 11/13/22 0604  WBC 13.3* 11.7*  NEUTROABS 10.8*  --   HGB 10.4* 10.2*  HCT 33.5* 32.8*  MCV 98.5 98.2  PLT 288 259    Basic Metabolic Panel: Recent Labs  Lab 11/12/22 0842 11/13/22 0604  NA 140 138  K 3.9 3.8  CL 106 106  CO2 25 24  GLUCOSE 245* 191*  BUN 20 17  CREATININE 1.24* 1.04*  CALCIUM 8.5* 8.2*    GFR: Estimated Creatinine Clearance: 68.8 mL/min (A) (by C-G formula based on SCr of 1.04 mg/dL (H)). Liver Function Tests: Recent Labs  Lab 11/13/22 0604  AST 9*  ALT 9  ALKPHOS 101  BILITOT 0.9  PROT 6.0*  ALBUMIN 2.7*    No results for input(s): "LIPASE", "AMYLASE" in the last 168 hours. No results for input(s): "AMMONIA" in the last 168 hours. Coagulation Profile: No results for input(s): "INR",  "PROTIME" in the last 168 hours. Cardiac Enzymes: No results for input(s): "CKTOTAL", "CKMB", "CKMBINDEX", "TROPONINI" in the last 168 hours. BNP (last 3 results) No results for input(s): "PROBNP" in the last 8760 hours. HbA1C: No results for input(s): "HGBA1C" in the last 72 hours. CBG: Recent Labs  Lab 11/17/22 1201 11/17/22 1805 11/17/22 2128 11/18/22 0303 11/18/22 0712  GLUCAP 144* 127* 115* 136* 121*    Lipid Profile: No results for input(s): "CHOL", "HDL", "LDLCALC", "TRIG", "CHOLHDL", "LDLDIRECT" in the last 72 hours. Thyroid Function Tests: No results for input(s): "TSH", "T4TOTAL", "FREET4", "T3FREE", "THYROIDAB" in the last 72 hours. Anemia Panel: No results for input(s): "VITAMINB12", "FOLATE", "FERRITIN", "TIBC", "IRON", "RETICCTPCT" in the last 72 hours. Sepsis Labs: No results for input(s): "PROCALCITON", "LATICACIDVEN" in the last 168 hours.  No results found for this or any previous visit (from the past 240 hour(s)).       Radiology Studies: No results found.  Scheduled Meds:  allopurinol  300 mg Oral Daily   amLODipine  10 mg Oral Daily   insulin aspart  0-20 Units Subcutaneous TID WC   insulin glargine-yfgn  36 Units Subcutaneous QHS   loratadine  10  mg Oral Daily   montelukast  10 mg Oral QHS   Continuous Infusions:     LOS: 0 days   Time spent:  Azucena Fallen, DO [facility]  If 7PM-7AM, please contact night-coverage [email or web address]  11/18/2022, 7:37 AM

## 2022-11-18 NOTE — TOC Progression Note (Signed)
Transition of Care [facility]) - Progression Note    Patient Details  Name: Jody Simpson MRN: [id number] Date of Birth: 12-13-1950  [facility] CM/SW Contact  Beckie Busing, RN Phone [phone] 11/18/2022, 1:15 PM  Clinical Narrative:    Cm at bedside to update patient on bed offers. Patient made aware that [facility] has declined. CM reviewed the list and star ratings per patient request. Patient begins to cry and states that she will just go home. CM questions to safety of home and patient states she is currently wheelchair bound and would like to be able to transfer herself. In that case she will be able to go home. Patient has requested that CM expand search to include a few more facilities. CM made patient aware that she is medically ready for discharge. Patient states that she is not agreeable to go to any of the facilities that have made bed offers.      Barriers to Discharge: Continued Medical Work up, SNF Pending bed offer  Expected Discharge Plan and Services                                               Social Determinants of Health (SDOH) Interventions SDOH Screenings   Food Insecurity: No Food Insecurity (11/12/2022)  Housing: Low Risk  (11/12/2022)  Transportation Needs: No Transportation Needs (11/12/2022)  Utilities: Not At Risk (11/12/2022)  Tobacco Use: Low Risk  (11/13/2022)    Readmission Risk Interventions    07/02/2022    7:37 AM  Readmission Risk Prevention Plan  Transportation Screening Complete  PCP or Specialist Appt within 5-7 Days Complete  Home Care Screening Complete  Medication Review (RN CM) Complete

## 2022-11-19 DIAGNOSIS — N179 Acute kidney failure, unspecified: Secondary | ICD-10-CM | POA: Diagnosis not present

## 2022-11-19 LAB — GLUCOSE, CAPILLARY
Glucose-Capillary: 133 mg/dL — ABNORMAL HIGH (ref 70–99)
Glucose-Capillary: 158 mg/dL — ABNORMAL HIGH (ref 70–99)
Glucose-Capillary: 159 mg/dL — ABNORMAL HIGH (ref 70–99)
Glucose-Capillary: 147 mg/dL — ABNORMAL HIGH (ref 70–99)

## 2022-11-19 MED ORDER — TRAZODONE HCL 50 MG PO TABS: 50.0000 mg | ORAL_TABLET | Freq: Every evening | ORAL | Status: DC | PRN

## 2022-11-19 MED ORDER — LISINOPRIL 20 MG PO TABS: 20.0000 mg | ORAL_TABLET | Freq: Every day | ORAL | Status: AC

## 2022-11-19 MED ORDER — HYDRALAZINE HCL 20 MG/ML IJ SOLN: 10.0000 mg | INTRAMUSCULAR | Status: DC | PRN

## 2022-11-19 MED ORDER — BACLOFEN 10 MG PO TABS: 10.0000 mg | ORAL_TABLET | Freq: Every day | ORAL | 0 refills | Status: AC | PRN

## 2022-11-19 MED ORDER — ONDANSETRON HCL 4 MG/2ML IJ SOLN: 4.0000 mg | Freq: Four times a day (QID) | INTRAMUSCULAR | Status: DC | PRN

## 2022-11-19 MED ORDER — GUAIFENESIN 100 MG/5ML PO LIQD: 5.0000 mL | ORAL | Status: DC | PRN

## 2022-11-19 MED ORDER — SENNOSIDES-DOCUSATE SODIUM 8.6-50 MG PO TABS: 1.0000 | ORAL_TABLET | Freq: Every evening | ORAL | Status: DC | PRN

## 2022-11-19 MED ORDER — METOPROLOL TARTRATE 5 MG/5ML IV SOLN: 5.0000 mg | INTRAVENOUS | Status: DC | PRN

## 2022-11-19 MED ORDER — LISINOPRIL 20 MG PO TABS
20.0000 mg | ORAL_TABLET | Freq: Every day | ORAL | Status: DC
  Administered 2022-11-19 – 2022-11-25 (×7): 20 mg via ORAL
  Filled 2022-11-19 (×7): qty 1

## 2022-11-19 MED ORDER — OXYCODONE HCL 5 MG PO TABS: 5.0000 mg | ORAL_TABLET | Freq: Three times a day (TID) | ORAL | 0 refills | Status: AC | PRN

## 2022-11-19 NOTE — Progress Notes (Signed)
PROGRESS NOTE    Jody JOKINEN  ZOX:[id number] DOB: [birth year] DOA: 11/12/2022 PCP: Theodis Shove, DO   Brief Narrative:   72 y.o. female with medical history significant of childhood asthma, community-acquired pneumonia, type 2 diabetes, GERD, gout, hypokalemia, hyperlipidemia, hypertension, IBS, renal cell carcinoma, class III obesity, neuropathy, nontraumatic rhabdomyolysis, paroxysmal atrial tachycardia, sinus arrhythmia, history of falls who was brought to the emergency department from her independent living facility due to having a mechanical/accidental fall and injuring her right knee while she was trying to transfer from her wheelchair to the recliner and slipped while transitioning.   Patient admitted overnight for elevated creatinine which is now resolved.  PT evaluating recommending ongoing rehab. Curbside discussion with EP per Cardiology consult - poor candidate for pacemaker given poor baseline functional status and lack of symptoms. May benefit from outpatient monitoring if she begins to have symptoms. Patient remains medically stable for discharge -patient currently has bed offers, awaiting decision by patient and approval from insurance for transition to ongoing rehab at [facility].   Assessment & Plan:  Principal Problem:   AKI (acute kidney injury) (HCC) Active Problems:   Diabetes mellitus, type 2 (HCC)   Hyperlipemia   Gout   Essential hypertension   PAT (paroxysmal atrial tachycardia)   Knee contusion   Class 3 obesity (HCC)   Pulmonary nodule   Sinus bradycardia   Right knee injury    AKI (acute kidney injury) (HCC), resolved This is resolved.  Encourage p.o. intake. Will resume lisinopril today due to elevated blood pressure.  Continue to hold Aldactone   Mechanical fall Ambulatory dysfunction, acute on chronic X-ray of the knee is negative.  PT recommending SNF.   Sinus bradycardia, 2-1 AV block with transient third-degree AV block. Cardiology  following, discussed with the EP - not candidate due to poor functional ability/lack of symptoms Patient should avoid AV nodal blocking agents Patient has failed multiple outpatient cardiac monitor attempts due to intolerance of adhesive -cardiology will attempt a repeat outpatient cardiac monitor from a different company in hopes she is not intolerant to their adhesive   Diabetes mellitus, type 2 (HCC) Carb modified diet.  Currently on sliding scale and Accu-Chek.  Semglee 36 units daily   Hyperlipemia Currently not on therapy. Follow-up with PCP.   Gout Continue allopurinol 300 mg p.o. daily.   Essential hypertension, uncontrolled Continue amlodipine 10 mg p.o. daily only Will resume lisinopril at a lower dose of 20 mg daily Continue to hold Aldactone IV as needed   Class 3 obesity (HCC) Current BMI 46.48 kg/m. Lifestyle modifications discussed at length. Follow-up with PCP and/or bariatric clinic for ongoing discussion.   History of pulmonary nodule incidentally noted  Repeat CT here "4 mm solid lingular nodule is stable. This could be followed with repeat CT in 18 months to ensure 2 years stability".   DVT prophylaxis: SCDs Start: 11/12/22 1258 Code Status:   Code Status: Full Code Family Communication: None present  Patient remains medically stable at this time to be discharged to SNF     Diet Orders (From admission, onward)     Start     Ordered   11/12/22 1259  Diet heart healthy/carb modified Room service appropriate? Yes; Fluid consistency: Thin  Diet effective now       Question Answer Comment  Diet-HS Snack? Nothing   Room service appropriate? Yes   Fluid consistency: Thin      11/12/22 1301  Subjective:  No complaints this morning. She still has not made up her mind about rehab facility  Examination:  General exam: Appears calm and comfortable  Respiratory system: Clear to auscultation. Respiratory effort normal. Cardiovascular  system: S1 & S2 heard, RRR. No JVD, murmurs, rubs, gallops or clicks. No pedal edema. Gastrointestinal system: Abdomen is nondistended, soft and nontender. No organomegaly or masses felt. Normal bowel sounds heard. Central nervous system: Alert and oriented. No focal neurological deficits. Extremities: Symmetric 5 x 5 power. Skin: No rashes, lesions or ulcers Psychiatry: Judgement and insight appear normal. Mood & affect appropriate.  Objective: Vitals:   11/18/22 0600 11/18/22 1329 11/18/22 2038 11/19/22 0505  BP: (!) 144/56 (!) 154/58 (!) 160/61 (!) 147/65  Pulse: (!) 58 (!) 58 63 (!) 56  Resp: 14 15 18 18   Temp:  97.9 F (36.6 C) 98.2 F (36.8 C) 97.7 F (36.5 C)  TempSrc:  Oral Oral Oral  SpO2: 98% 94% 92% 93%  Weight:      Height:        Intake/Output Summary (Last 24 hours) at 11/19/2022 0803 Last data filed at 11/18/2022 1837 Gross per 24 hour  Intake 660 ml  Output 700 ml  Net -40 ml   Filed Weights   11/12/22 0802 11/12/22 1615  Weight: 130.6 kg 130.6 kg    Scheduled Meds:  allopurinol  300 mg Oral Daily   amLODipine  10 mg Oral Daily   insulin aspart  0-20 Units Subcutaneous TID WC   insulin glargine-yfgn  36 Units Subcutaneous QHS   loratadine  10 mg Oral Daily   montelukast  10 mg Oral QHS   Continuous Infusions:  Nutritional status     Body mass index is 46.48 kg/m.  Data Reviewed:   CBC: Recent Labs  Lab 11/12/22 0842 11/13/22 0604  WBC 13.3* 11.7*  NEUTROABS 10.8*  --   HGB 10.4* 10.2*  HCT 33.5* 32.8*  MCV 98.5 98.2  PLT 288 259   Basic Metabolic Panel: Recent Labs  Lab 11/12/22 0842 11/13/22 0604  NA 140 138  K 3.9 3.8  CL 106 106  CO2 25 24  GLUCOSE 245* 191*  BUN 20 17  CREATININE 1.24* 1.04*  CALCIUM 8.5* 8.2*   GFR: Estimated Creatinine Clearance: 68.8 mL/min (A) (by C-G formula based on SCr of 1.04 mg/dL (H)). Liver Function Tests: Recent Labs  Lab 11/13/22 0604  AST 9*  ALT 9  ALKPHOS 101  BILITOT 0.9  PROT  6.0*  ALBUMIN 2.7*   No results for input(s): "LIPASE", "AMYLASE" in the last 168 hours. No results for input(s): "AMMONIA" in the last 168 hours. Coagulation Profile: No results for input(s): "INR", "PROTIME" in the last 168 hours. Cardiac Enzymes: No results for input(s): "CKTOTAL", "CKMB", "CKMBINDEX", "TROPONINI" in the last 168 hours. BNP (last 3 results) No results for input(s): "PROBNP" in the last 8760 hours. HbA1C: No results for input(s): "HGBA1C" in the last 72 hours. CBG: Recent Labs  Lab 11/18/22 0712 11/18/22 1129 11/18/22 1532 11/18/22 2121 11/19/22 0725  GLUCAP 121* 152* 144* 165* 133*   Lipid Profile: No results for input(s): "CHOL", "HDL", "LDLCALC", "TRIG", "CHOLHDL", "LDLDIRECT" in the last 72 hours. Thyroid Function Tests: No results for input(s): "TSH", "T4TOTAL", "FREET4", "T3FREE", "THYROIDAB" in the last 72 hours. Anemia Panel: No results for input(s): "VITAMINB12", "FOLATE", "FERRITIN", "TIBC", "IRON", "RETICCTPCT" in the last 72 hours. Sepsis Labs: No results for input(s): "PROCALCITON", "LATICACIDVEN" in the last 168 hours.  No results found  for this or any previous visit (from the past 240 hour(s)).       Radiology Studies: No results found.         LOS: 0 days   Time spent= 35 mins    Keriann Rankin Joline Maxcy, MD [facility]  If 7PM-7AM, please contact night-coverage  11/19/2022, 8:03 AM

## 2022-11-19 NOTE — Progress Notes (Signed)
Physical Therapy Treatment Patient Details Name: SAMARI ENTLER MRN: [id number] DOB: August 19, 1950 Today's Date: 11/19/2022   History of Present Illness 72 yo morbidly obese female with significant history of diabetes, HTN, asthma, IBS, neuropathy, morbid obestiy, brought here via EMS from a senior living facility for evaluation of a fall.    PT Comments    With much effort and 3 persons for safety, patient able to stand and pivot to get into her WC. Patient then propelled around in [facility]. Will return to assist patient back into bed.  Patient ,unfortunately, is not functioning at a level to be home alone.   Recommendations for follow up therapy are one component of a multi-disciplinary discharge planning process, led by the attending physician.  Recommendations may be updated based on patient status, additional functional criteria and insurance authorization.  Follow Up Recommendations  Can patient physically be transported by private vehicle: No    Assistance Recommended at Discharge Frequent or constant Supervision/Assistance  Patient can return home with the following Two people to help with walking and/or transfers;Two people to help with bathing/dressing/bathroom;Assistance with cooking/housework;Assist for transportation   Equipment Recommendations       Recommendations for Other Services       Precautions / Restrictions Precautions Precautions: Fall Precaution Comments: body habitus Restrictions Weight Bearing Restrictions: No     Mobility  Bed Mobility   Bed Mobility: Supine to Sit     Supine to sit: Min assist, HOB elevated     General bed mobility comments: min A to upright trunk into sitting EOB with strong use,    Transfers Overall transfer level: Needs assistance   Transfers: Sit to/from Stand, Bed to chair/wheelchair/BSC Sit to Stand: +2 physical assistance, +2 safety/equipment, From elevated surface   Step pivot transfers: Mod assist, +2 physical  assistance, +2 safety/equipment       General transfer comment: after multiple attempts and +2 mod assist to stand using bed pad to assist to power up and patient leaning forward on WC placed  directly in front of patient, patient able to stand and prop hands on WC , then step to then  turn180* around to sit down into WC.    Ambulation/Gait                   Psychologist, counselling mobility: Yes Wheelchair propulsion: Both lower extermities Wheelchair parts: Supervision/cueing Distance: 60 Wheelchair Assistance Details (indicate cue type and reason): use of both legs  Modified Rankin (Stroke Patients Only)       Balance Overall balance assessment: Needs assistance, History of Falls Sitting-balance support: Feet supported Sitting balance-[name]: Good     Standing balance support: Bilateral upper extremity supported Standing balance-[name]: Poor Standing balance comment: relies on UE support and leaning forward over WC.                            Cognition Arousal/Alertness: Awake/alert Behavior During Therapy: WFL for tasks assessed/performed                                   General Comments: tearful about being back into hospital        Exercises      General Comments        Pertinent Vitals/Pain Pain Assessment  Faces Pain Scale: Hurts a little bit Pain Location: R knee Pain Descriptors / Indicators: Tender, Discomfort, Sore Pain Intervention(s): Monitored during session    Home Living                          Prior Function            PT Goals (current goals can now be found in the care plan section) Progress towards PT goals: Progressing toward goals    Frequency    Min 1X/week      PT Plan Current plan remains appropriate    Co-evaluation              AM-PAC PT "6 Clicks" Mobility   Outcome Measure  Help needed turning  from your back to your side while in a flat bed without using bedrails?: A Lot Help needed moving from lying on your back to sitting on the side of a flat bed without using bedrails?: Total Help needed moving to and from a bed to a chair (including a wheelchair)?: Total Help needed standing up from a chair using your arms (e.g., wheelchair or bedside chair)?: Total Help needed to walk in hospital room?: Total Help needed climbing 3-5 steps with a railing? : Total 6 Click Score: 7    End of Session Equipment Utilized During Treatment: Gait belt Activity Tolerance: Patient tolerated treatment well Patient left: in bed;with call bell/phone within reach;with nursing/sitter in room;with bed alarm set Nurse Communication: Mobility status;Need for lift equipment PT Visit Diagnosis: Other abnormalities of gait and mobility (R26.89);Muscle weakness (generalized) (M62.81);History of falling (Z91.81);Pain Pain - Right/Left: Right Pain - part of body: Knee     Time: 4098-1191 PT Time Calculation (min) (ACUTE ONLY): 46 min  Charges:  $Therapeutic Activity: 38-52 mins                     Blanchard Kelch PT Acute Rehabilitation Services Office [phone] Weekend [phone]    Rada Hay 11/19/2022, 1:54 PM

## 2022-11-19 NOTE — TOC Progression Note (Addendum)
Transition of Care [facility]) - Progression Note    Patient Details  Name: Jody Simpson MRN: [id number] Date of Birth: Jun 16, 1950  Transition of Care [facility]) CM/SW Contact  Beckie Busing, RN Phone Number:[phone]  11/19/2022, 10:52 AM  Clinical Narrative:    CM at bedside to make patient aware of bed offers. Patient wants to review each offer location and reviews. CM has provided patient with list and also advised her that she is medically ready for discharge and that she will need to make a decision on placement or other disposition plans. Patient currently reviewing facilities CM has made patient aware that CM will return shortly.    1200 CM at bedside. Patient has decided on [place]. CM spoke with Crystall Jaggers admissions director for [facility] who confirms that facility does have a bed for the patient. Insurance Berkley Harvey will be initiated. Unable to initiate prior to facility choice. CM will continue to follow  1356 Insurance auth initiated pending cert is [id number]      Barriers to Discharge: Continued Medical Work up, SNF Pending bed offer  Expected Discharge Plan and Services                                               Social Determinants of Health (SDOH) Interventions SDOH Screenings   Food Insecurity: No Food Insecurity (11/12/2022)  Housing: Low Risk  (11/12/2022)  Transportation Needs: No Transportation Needs (11/12/2022)  Utilities: Not At Risk (11/12/2022)  Tobacco Use: Low Risk  (11/13/2022)    Readmission Risk Interventions    07/02/2022    7:37 AM  Readmission Risk Prevention Plan  Transportation Screening Complete  PCP or Specialist Appt within 5-7 Days Complete  Home Care Screening Complete  Medication Review (RN CM) Complete

## 2022-11-19 NOTE — Progress Notes (Signed)
Physical Therapy Treatment Patient Details Name: Jody Simpson MRN: [id number] DOB: December 07, 1950 Today's Date: 11/19/2022   History of Present Illness 72 yo morbidly obese female with significant history of diabetes, HTN, asthma, IBS, neuropathy, morbid obestiy, brought here via EMS from a senior living facility for evaluation of a fall.    PT Comments     With much effort and    assistance of 3 persons, patient assisted to stand from [facility] and pivot back to the bed. Patient is making slow gains in  transfers but not to level to be home alone.    Recommendations for follow up therapy are one component of a multi-disciplinary discharge planning process, led by the attending physician.  Recommendations may be updated based on patient status, additional functional criteria and insurance authorization.  Follow Up Recommendations  Can patient physically be transported by private vehicle: No    Assistance Recommended at Discharge Frequent or constant Supervision/Assistance  Patient can return home with the following Two people to help with walking and/or transfers;Two people to help with bathing/dressing/bathroom;Assistance with cooking/housework;Assist for transportation   Equipment Recommendations  None recommended by PT    Recommendations for Other Services       Precautions / Restrictions Precautions Precautions: Fall Precaution Comments: body habitus Restrictions Weight Bearing Restrictions: No     Mobility  Bed Mobility   Bed Mobility: Sit to Supine     Sit to supine: Max assist, +2 for physical assistance   General bed mobility comments: assist legs and trunk    Transfers Overall transfer level: Needs assistance   Transfers: Sit to/from Stand, Bed to chair/wheelchair/BSC Sit to Stand: Max assist (+3)   Step pivot transfers: +2 physical assistance, +2 safety/equipment       General transfer comment: After multiple attempts to stand from [facility] with +3 assisting,  patient able to take a few steps to turn to bed, Unable to complete a full turn but enough to get 1 buttock in contact with bed.    Ambulation/Gait                   Psychologist, counselling mobility: Yes Wheelchair propulsion: Both lower extermities Wheelchair parts: Supervision/cueing Distance: 60 Wheelchair Assistance Details (indicate cue type and reason): use of both legs  Modified Rankin (Stroke Patients Only)       Balance Overall balance assessment: Needs assistance, History of Falls Sitting-balance support: Feet supported Sitting balance-[name]: Good     Standing balance support: Bilateral upper extremity supported Standing balance-[name]: Poor Standing balance comment: relies on UE support and leaning forward over WC.                            Cognition Arousal/Alertness: Awake/alert Behavior During Therapy: WFL for tasks assessed/performed                                   General Comments: tearful about being back into hospital        Exercises      General Comments        Pertinent Vitals/Pain Pain Assessment Faces Pain Scale: Hurts a little bit Pain Location: R knee Pain Descriptors / Indicators: Tender, Discomfort, Sore Pain Intervention(s): Monitored during session    Home Living  Prior Function            PT Goals (current goals can now be found in the care plan section) Progress towards PT goals: Progressing toward goals    Frequency    Min 1X/week      PT Plan Current plan remains appropriate    Co-evaluation              AM-PAC PT "6 Clicks" Mobility   Outcome Measure  Help needed turning from your back to your side while in a flat bed without using bedrails?: A Lot Help needed moving from lying on your back to sitting on the side of a flat bed without using bedrails?: Total Help  needed moving to and from a bed to a chair (including a wheelchair)?: Total Help needed standing up from a chair using your arms (e.g., wheelchair or bedside chair)?: Total Help needed to walk in hospital room?: Total Help needed climbing 3-5 steps with a railing? : Total 6 Click Score: 7    End of Session Equipment Utilized During Treatment: Gait belt Activity Tolerance: Patient tolerated treatment well;Patient limited by fatigue Patient left: in bed;with call bell/phone within reach;with nursing/sitter in room;with bed alarm set Nurse Communication: Mobility status;Need for lift equipment PT Visit Diagnosis: Other abnormalities of gait and mobility (R26.89);Muscle weakness (generalized) (M62.81);History of falling (Z91.81);Pain Pain - Right/Left: Right Pain - part of body: Knee     Time: 1610-9604 PT Time Calculation (min) (ACUTE ONLY): 23 min  Charges:  $Therapeutic Activity: 23-37 mins                     Blanchard Kelch PT Acute Rehabilitation Services Office [phone] Weekend [phone]    Rada Hay 11/19/2022, 1:59 PM

## 2022-11-20 DIAGNOSIS — N179 Acute kidney failure, unspecified: Secondary | ICD-10-CM | POA: Diagnosis not present

## 2022-11-20 LAB — GLUCOSE, CAPILLARY
Glucose-Capillary: 121 mg/dL — ABNORMAL HIGH (ref 70–99)
Glucose-Capillary: 152 mg/dL — ABNORMAL HIGH (ref 70–99)
Glucose-Capillary: 130 mg/dL — ABNORMAL HIGH (ref 70–99)
Glucose-Capillary: 131 mg/dL — ABNORMAL HIGH (ref 70–99)

## 2022-11-20 LAB — BASIC METABOLIC PANEL
Anion gap: 7 (ref 5–15)
BUN: 11 mg/dL (ref 8–23)
CO2: 28 mmol/L (ref 22–32)
Calcium: 8.4 mg/dL — ABNORMAL LOW (ref 8.9–10.3)
Chloride: 104 mmol/L (ref 98–111)
Creatinine, Ser: 0.79 mg/dL (ref 0.44–1.00)
GFR, Estimated: >60 mL/min (ref >60)
Glucose, Bld: 140 mg/dL — ABNORMAL HIGH (ref 70–99)
Potassium: 3.1 mmol/L — ABNORMAL LOW (ref 3.5–5.1)
Sodium: 139 mmol/L (ref 135–145)

## 2022-11-20 LAB — CBC
HCT: 35.6 % — ABNORMAL LOW (ref 36.0–46.0)
Hemoglobin: 11 g/dL — ABNORMAL LOW (ref 12.0–15.0)
MCH: 29.6 pg (ref 26.0–34.0)
MCHC: 30.9 g/dL (ref 30.0–36.0)
MCV: 95.7 fL (ref 80.0–100.0)
Platelets: 339 10*3/uL (ref 150–400)
RBC: 3.72 MIL/uL — ABNORMAL LOW (ref 3.87–5.11)
RDW: 13.6 % (ref 11.5–15.5)
WBC: 10.9 10*3/uL — ABNORMAL HIGH (ref 4.0–10.5)
nRBC: 0 % (ref 0.0–0.2)

## 2022-11-20 LAB — MAGNESIUM: Magnesium: 2 mg/dL (ref 1.7–2.4)

## 2022-11-20 LAB — BRAIN NATRIURETIC PEPTIDE: B Natriuretic Peptide: 419.7 pg/mL — ABNORMAL HIGH (ref 0.0–100.0)

## 2022-11-20 MED ORDER — FUROSEMIDE 40 MG PO TABS
40.0000 mg | ORAL_TABLET | Freq: Once | ORAL | Status: AC
  Administered 2022-11-20: 40 mg via ORAL
  Filled 2022-11-20: qty 1

## 2022-11-20 MED ORDER — POTASSIUM CHLORIDE CRYS ER 20 MEQ PO TBCR
40.0000 meq | EXTENDED_RELEASE_TABLET | Freq: Once | ORAL | Status: AC
  Administered 2022-11-20: 40 meq via ORAL
  Filled 2022-11-20: qty 2

## 2022-11-20 MED ORDER — POTASSIUM CHLORIDE 10 MEQ/100ML IV SOLN
10.0000 meq | INTRAVENOUS | Status: AC
  Administered 2022-11-20 (×2): 10 meq via INTRAVENOUS
  Filled 2022-11-20: qty 100

## 2022-11-20 NOTE — TOC Progression Note (Signed)
Transition of Care [facility]) - Progression Note    Patient Details  Name: Jody Simpson MRN: [id number] Date of Birth: Jul 16, 1950  Transition of Care [facility]) CM/SW Contact  Beckie Busing, RN Phone Number:[phone]  11/20/2022, 10:04 AM  Clinical Narrative:    CM spoke with [facility] admissions director for [place]. Crystal has checked Portal and insurance Berkley Harvey is still pending. TOC continues to follow.      Barriers to Discharge: Continued Medical Work up, SNF Pending bed offer  Expected Discharge Plan and Services         Expected Discharge Date: 11/20/22                                     Social Determinants of Health (SDOH) Interventions SDOH Screenings   Food Insecurity: No Food Insecurity (11/12/2022)  Housing: Low Risk  (11/12/2022)  Transportation Needs: No Transportation Needs (11/12/2022)  Utilities: Not At Risk (11/12/2022)  Tobacco Use: Low Risk  (11/13/2022)    Readmission Risk Interventions    07/02/2022    7:37 AM  Readmission Risk Prevention Plan  Transportation Screening Complete  PCP or Specialist Appt within 5-7 Days Complete  Home Care Screening Complete  Medication Review (RN CM) Complete

## 2022-11-20 NOTE — Plan of Care (Signed)

## 2022-11-20 NOTE — Plan of Care (Addendum)
MD Nelson Chimes notified of patient's upper arm edema & BNP ordered, PO Lasix administered per order. Patient is awaiting insurance authorization for discharge.  Problem: Education: Goal: Knowledge of General Education information will improve Description: Including pain rating scale, medication(s)/side effects and non-pharmacologic comfort measures Outcome: Progressing   Problem: Health Behavior/Discharge Planning: Goal: Ability to manage health-related needs will improve Outcome: Progressing   Problem: Clinical Measurements: Goal: Ability to maintain clinical measurements within normal limits will improve Outcome: Progressing Goal: Will remain free from infection Outcome: Progressing Goal: Diagnostic test results will improve Outcome: Progressing Goal: Respiratory complications will improve Outcome: Progressing Goal: Cardiovascular complication will be avoided Outcome: Progressing   Problem: Activity: Goal: Risk for activity intolerance will decrease Outcome: Progressing   Problem: Nutrition: Goal: Adequate nutrition will be maintained Outcome: Progressing   Problem: Coping: Goal: Level of anxiety will decrease Outcome: Progressing   Problem: Elimination: Goal: Will not experience complications related to bowel motility Outcome: Progressing Goal: Will not experience complications related to urinary retention Outcome: Progressing   Problem: Pain Managment: Goal: General experience of comfort will improve Outcome: Progressing   Problem: Safety: Goal: Ability to remain free from injury will improve Outcome: Progressing   Problem: Skin Integrity: Goal: Risk for impaired skin integrity will decrease Outcome: Progressing   Problem: Education: Goal: Ability to describe self-care measures that may prevent or decrease complications (Diabetes Survival Skills Education) will improve Outcome: Progressing Goal: Individualized Educational Video(s) Outcome: Progressing    Problem: Coping: Goal: Ability to adjust to condition or change in health will improve Outcome: Progressing   Problem: Fluid Volume: Goal: Ability to maintain a balanced intake and output will improve Outcome: Progressing   Problem: Health Behavior/Discharge Planning: Goal: Ability to identify and utilize available resources and services will improve Outcome: Progressing Goal: Ability to manage health-related needs will improve Outcome: Progressing   Problem: Metabolic: Goal: Ability to maintain appropriate glucose levels will improve Outcome: Progressing   Problem: Nutritional: Goal: Maintenance of adequate nutrition will improve Outcome: Progressing Goal: Progress toward achieving an optimal weight will improve Outcome: Progressing   Problem: Skin Integrity: Goal: Risk for impaired skin integrity will decrease Outcome: Progressing   Problem: Tissue Perfusion: Goal: Adequacy of tissue perfusion will improve Outcome: Progressing

## 2022-11-20 NOTE — Discharge Summary (Addendum)
Physician Discharge Summary  ARDRA KUZNICKI WJX:[id number] DOB: [birth year] DOA: 11/12/2022  PCP: Theodis Shove, DO  Admit date: 11/12/2022 Discharge date: 11/25/2022  Admitted From: Home Disposition:  SNF  Recommendations for Outpatient Follow-up:  Follow up with PCP in 1-2 weeks Please obtain BMP/CBC in 2-3 days.  Replete electrolytes as necessary Continue insulin regimen Lisinopril dose lowered to 20 mg daily.  Resume Aldactone.  Adjust these medications as needed Avoid AV nodal blocking agents Follow-up outpatient PCP regarding pulmonary nodule   Discharge Condition: Stable CODE STATUS: Full code Diet recommendation: Diabetic  Brief/Interim Summary: 72 y.o. female with medical history significant of childhood asthma, community-acquired pneumonia, type 2 diabetes, GERD, gout, hypokalemia, hyperlipidemia, hypertension, IBS, renal cell carcinoma, class III obesity, neuropathy, nontraumatic rhabdomyolysis, paroxysmal atrial tachycardia, sinus arrhythmia, history of falls who was brought to the emergency department from her independent living facility due to having a mechanical/accidental fall and injuring her right knee while she was trying to transfer from her wheelchair to the recliner and slipped while transitioning.   Patient admitted overnight for elevated creatinine which is now resolved.  PT evaluating recommending ongoing rehab. Curbside discussion with EP per Cardiology consult - poor candidate for pacemaker given poor baseline functional status and lack of symptoms. May benefit from outpatient monitoring if she begins to have symptoms. Patient remains medically stable for discharge -patient currently has bed offers, awaiting decision by patient and approval from insurance for transition to ongoing rehab at [facility].    AKI (acute kidney injury) (HCC), resolved Resolved.  Aldactone resumed, lisinopril resumed at 20 mg daily, can slowly uptitrate to 40 mg daily as appropriate    Mechanical fall Ambulatory dysfunction, acute on chronic X-ray of the knee is negative.  PT recommending SNF.   Sinus bradycardia, 2-1 AV block with transient third-degree AV block. Cardiology following, discussed with the EP - not candidate due to poor functional ability/lack of symptoms Patient should avoid AV nodal blocking agents Patient has failed multiple outpatient cardiac monitor attempts due to intolerance of adhesive -cardiology will attempt a repeat outpatient cardiac monitor from a different company in hopes she is not intolerant to their adhesive   Diabetes mellitus, type 2 (HCC) Carb modified diet.  NovoLog 6 units Premeal.  Lantus 25 units daily.  Adjust these medications as necessary   Hyperlipemia Currently not on therapy. Follow-up with PCP.   Gout Continue allopurinol 300 mg p.o. daily.   Essential hypertension, uncontrolled On Norvasc, lisinopril and Aldactone.  Further continue to adjust as necessary   Class 3 obesity (HCC) Current BMI 46.48 kg/m. Lifestyle modifications discussed at length. Follow-up with PCP and/or bariatric clinic for ongoing discussion.   History of pulmonary nodule incidentally noted  Repeat CT here "4 mm solid lingular nodule is stable. This could be followed with repeat CT in 18 months to ensure 2 years stability".   Bilateral upper extremity swelling - Due to poor mobilization she has some third spacing.  Will give her Lasix.  No signs of respiratory distress.  Advised to elevate her arms   Discharge Diagnoses:  Principal Problem:   AKI (acute kidney injury) (HCC) Active Problems:   Diabetes mellitus, type 2 (HCC)   Hyperlipemia   Gout   Essential hypertension   PAT (paroxysmal atrial tachycardia)   Knee contusion   Class 3 obesity (HCC)   Pulmonary nodule   Sinus bradycardia   Right knee injury      Consultations: None  Subjective: No complaints  Discharge Exam:  Vitals:   11/24/22 2048 11/25/22 0539  BP:  (!) 151/67 (!) 146/54  Pulse: (!) 58 (!) 53  Resp: 14 14  Temp: 98.6 F (37 C) 98.1 F (36.7 C)  SpO2: 94% 96%   Vitals:   11/24/22 1400 11/24/22 1807 11/24/22 2048 11/25/22 0539  BP: (!) 151/61  (!) 151/67 (!) 146/54  Pulse: (!) 57  (!) 58 (!) 53  Resp: 17  14 14   Temp: 98.5 F (36.9 C)  98.6 F (37 C) 98.1 F (36.7 C)  TempSrc: Oral  Oral Oral  SpO2:  95% 94% 96%  Weight:      Height:        General: Pt is alert, awake, not in acute distress Cardiovascular: RRR, S1/S2 +, no rubs, no gallops Respiratory: CTA bilaterally, no wheezing, no rhonchi Abdominal: Soft, NT, ND, bowel sounds + Extremities: no edema, no cyanosis.  Bilateral upper extremity swelling, 1+ bilateral lower extremity pitting edema  Discharge Instructions   Allergies as of 11/25/2022       Reactions   Aspirin    REACTION: stomach irritation   Banana    Cholestyramine    REACTION: Diarrhea and nausea   Codeine    REACTION: stomach irritant   Diazepam    REACTION: hyper   Hydrocodone    Ibuprofen Other (See Comments)   Iodine    Latex    Meperidine Hcl    Metformin    REACTION: Diarrhea   Naproxen Sodium    REACTION: stomach irritant   Penicillins    Pravastatin Sodium    REACTION: Flu like symptoms   Sitagliptin Phosphate    REACTION: Caused elevated CBGs   Tape Rash   Tegaderm transparent plastic film        Medication List     TAKE these medications    acetaminophen 325 MG tablet Commonly known as: TYLENOL Take 650 mg by mouth every 4 (four) hours as needed for mild pain.   allopurinol 300 MG tablet Commonly known as: ZYLOPRIM Take 300 mg by mouth daily.   amLODipine 10 MG tablet Commonly known as: NORVASC Take 1 tablet (10 mg total) by mouth daily.   baclofen 10 MG tablet Commonly known as: LIORESAL Take 1 tablet (10 mg total) by mouth daily as needed for muscle spasms.   fluticasone 50 MCG/ACT nasal spray Commonly known as: FLONASE Place 2 sprays into both  nostrils daily as needed for allergies.   insulin aspart 100 UNIT/ML injection Commonly known as: novoLOG Inject 6 Units into the skin 3 (three) times daily with meals.   insulin glargine 100 UNIT/ML Solostar Pen Commonly known as: LANTUS Inject 25 Units into the skin daily. What changed: how much to take   lisinopril 20 MG tablet Commonly known as: ZESTRIL Take 1 tablet (20 mg total) by mouth daily. What changed:  medication strength how much to take   loratadine 10 MG tablet Commonly known as: CLARITIN Take 10 mg by mouth daily.   montelukast 10 MG tablet Commonly known as: SINGULAIR Take 10 mg by mouth at bedtime.   oxyCODONE 5 MG immediate release tablet Commonly known as: Oxy IR/ROXICODONE Take 1 tablet (5 mg total) by mouth every 8 (eight) hours as needed for breakthrough pain.   spironolactone 25 MG tablet Commonly known as: ALDACTONE Take 1 tablet (25 mg total) by mouth daily.   Ventolin HFA 108 (90 Base) MCG/ACT inhaler Generic drug: albuterol Inhale 2 puffs into the lungs every 4 (four) hours  as needed for wheezing or shortness of breath.        Follow-up Information     O'Neal, Ronnald Ramp, MD Follow up on 01/05/2023.   Specialties: Cardiology, Internal Medicine, Radiology Why: at 1:20 pm  please arrive 15 min early to check in Contact information: [address] [place] [address] [phone]         Combs, Prince Solian, DO Follow up in 1 week(s).   Specialty: Geriatric Medicine Contact information: [address] [place] [address] [phone]         Combs, Prince Solian, DO .   Specialty: Geriatric Medicine Contact information: [address] [place] [address] [phone]                Allergies  Allergen Reactions   Aspirin     REACTION: stomach irritation   Banana    Cholestyramine     REACTION: Diarrhea and nausea   Codeine     REACTION: stomach irritant   Diazepam     REACTION:  hyper   Hydrocodone    Ibuprofen Other (See Comments)   Iodine    Latex    Meperidine Hcl    Metformin     REACTION: Diarrhea   Naproxen Sodium     REACTION: stomach irritant   Penicillins    Pravastatin Sodium     REACTION: Flu like symptoms   Sitagliptin Phosphate     REACTION: Caused elevated CBGs   Tape Rash    Tegaderm transparent plastic film    You were cared for by a hospitalist during your hospital stay. If you have any questions about your discharge medications or the care you received while you were in the hospital after you are discharged, you can call the unit and asked to speak with the hospitalist on call if the hospitalist that took care of you is not available. Once you are discharged, your primary care physician will handle any further medical issues. Please note that no refills for any discharge medications will be authorized once you are discharged, as it is imperative that you return to your primary care physician (or establish a relationship with a primary care physician if you do not have one) for your aftercare needs so that they can reassess your need for medications and monitor your lab values.  You were cared for by a hospitalist during your hospital stay. If you have any questions about your discharge medications or the care you received while you were in the hospital after you are discharged, you can call the unit and asked to speak with the hospitalist on call if the hospitalist that took care of you is not available. Once you are discharged, your primary care physician will handle any further medical issues. Please note that NO REFILLS for any discharge medications will be authorized once you are discharged, as it is imperative that you return to your primary care physician (or establish a relationship with a primary care physician if you do not have one) for your aftercare needs so that they can reassess your need for medications and monitor your lab  values.  Please request your Prim.MD to go over all Hospital Tests and Procedure/Radiological results at the follow up, please get all Hospital records sent to your Prim MD by signing hospital release before you go home.  Get CBC, CMP, 2 view Chest X ray checked  by Primary MD during your next visit or SNF MD in 5-7 days ( we routinely  change or add medications that can affect your baseline labs and fluid status, therefore we recommend that you get the mentioned basic workup next visit with your PCP, your PCP may decide not to get them or add new tests based on their clinical decision)  On your next visit with your primary care physician please Get Medicines reviewed and adjusted.  If you experience worsening of your admission symptoms, develop shortness of breath, life threatening emergency, suicidal or homicidal thoughts you must seek medical attention immediately by calling 911 or calling your MD immediately  if symptoms less severe.  You Must read complete instructions/literature along with all the possible adverse reactions/side effects for all the Medicines you take and that have been prescribed to you. Take any new Medicines after you have completely understood and accpet all the possible adverse reactions/side effects.   Do not drive, operate heavy machinery, perform activities at heights, swimming or participation in water activities or provide baby sitting services if your were admitted for syncope or siezures until you have seen by Primary MD or a Neurologist and advised to do so again.  Do not drive when taking Pain medications.   Procedures/Studies: CT CHEST WO CONTRAST  Result Date: 11/12/2022 CLINICAL DATA:  Lung nodule, > 8mm Ground glass nodule F/Up from CT done 4 plus months ago. EXAM: CT CHEST WITHOUT CONTRAST TECHNIQUE: Multidetector CT imaging of the chest was performed following the standard protocol without IV contrast. RADIATION DOSE REDUCTION: This exam was performed  according to the departmental dose-optimization program which includes automated exposure control, adjustment of the mA and/or kV according to patient size and/or use of iterative reconstruction technique. COMPARISON:  07/01/2022 FINDINGS: Cardiovascular: Cardiomegaly. Dense mitral valve and aortic valve calcifications. Extensive coronary artery and scattered aortic calcifications. No aneurysm. Mediastinum/Nodes: No mediastinal, hilar, or axillary adenopathy. Trachea and esophagus are unremarkable. Thyroid unremarkable. Lungs/Pleura: Previously seen ground-glass nodular area in the anterior right upper lobe has resolved. Small solid lingular nodule measures 4 mm on image 79 of series 6, stable. No new or enlarging nodules. Bibasilar atelectasis. No effusions. Upper Abdomen: No acute findings Musculoskeletal: Chest wall soft tissues are unremarkable. No acute bony abnormality. IMPRESSION: Previously seen ground-glass nodular area in the right upper lobe has resolved compatible with and inflammatory process. 4 mm solid lingular nodule is stable. This could be followed with repeat CT in 18 months to ensure 2 years stability. Coronary artery disease.  Mild cardiomegaly. Aortic Atherosclerosis (ICD10-I70.0). Electronically Signed   By: Charlett Nose M.D.   On: 11/12/2022 20:22   DG Knee Complete 4 Views Right  Result Date: 11/12/2022 CLINICAL DATA:  Unwitnessed fall EXAM: RIGHT KNEE - COMPLETE 4 VIEW COMPARISON:  None Available. FINDINGS: No evidence of fracture, dislocation, or joint effusion. Moderate tricompartmental degenerative changes of the knee. Soft tissues are unremarkable. IMPRESSION: 1. No acute fracture or dislocation. 2. Moderate tricompartmental degenerative changes of the knee. Electronically Signed   By: Agustin Cree M.D.   On: 11/12/2022 09:22     The results of significant diagnostics from this hospitalization (including imaging, microbiology, ancillary and laboratory) are listed below for  reference.     Microbiology: No results found for this or any previous visit (from the past 240 hour(s)).   Labs: BNP (last 3 results) Recent Labs    07/01/22 1741 07/02/22 0528 11/20/22 0558  BNP 400.6* 293.0* 419.7*   Basic Metabolic Panel: Recent Labs  Lab 11/20/22 0558 11/21/22 0848  NA 139  --   K  3.1* 3.6  CL 104  --   CO2 28  --   GLUCOSE 140*  --   BUN 11  --   CREATININE 0.79  --   CALCIUM 8.4*  --   MG 2.0  --    Liver Function Tests: No results for input(s): "AST", "ALT", "ALKPHOS", "BILITOT", "PROT", "ALBUMIN" in the last 168 hours. No results for input(s): "LIPASE", "AMYLASE" in the last 168 hours. No results for input(s): "AMMONIA" in the last 168 hours. CBC: Recent Labs  Lab 11/20/22 0558  WBC 10.9*  HGB 11.0*  HCT 35.6*  MCV 95.7  PLT 339   Cardiac Enzymes: No results for input(s): "CKTOTAL", "CKMB", "CKMBINDEX", "TROPONINI" in the last 168 hours. BNP: Invalid input(s): "POCBNP" CBG: Recent Labs  Lab 11/24/22 0753 11/24/22 1218 11/24/22 1725 11/24/22 2050 11/25/22 0735  GLUCAP 100* 153* 151* 153* 140*   D-Dimer No results for input(s): "DDIMER" in the last 72 hours. Hgb A1c No results for input(s): "HGBA1C" in the last 72 hours. Lipid Profile No results for input(s): "CHOL", "HDL", "LDLCALC", "TRIG", "CHOLHDL", "LDLDIRECT" in the last 72 hours. Thyroid function studies No results for input(s): "TSH", "T4TOTAL", "T3FREE", "THYROIDAB" in the last 72 hours.  Invalid input(s): "FREET3" Anemia work up No results for input(s): "VITAMINB12", "FOLATE", "FERRITIN", "TIBC", "IRON", "RETICCTPCT" in the last 72 hours. Urinalysis    Component Value Date/Time   COLORURINE YELLOW 11/12/2022 1052   APPEARANCEUR HAZY (A) 11/12/2022 1052   LABSPEC 1.018 11/12/2022 1052   PHURINE 5.0 11/12/2022 1052   GLUCOSEU 50 (A) 11/12/2022 1052   HGBUR MODERATE (A) 11/12/2022 1052   HGBUR negative 12/10/2006 1513   BILIRUBINUR NEGATIVE 11/12/2022 1052    KETONESUR NEGATIVE 11/12/2022 1052   PROTEINUR 100 (A) 11/12/2022 1052   UROBILINOGEN 0.2 12/10/2006 1513   NITRITE NEGATIVE 11/12/2022 1052   LEUKOCYTESUR TRACE (A) 11/12/2022 1052   Sepsis Labs Recent Labs  Lab 11/20/22 0558  WBC 10.9*   Microbiology No results found for this or any previous visit (from the past 240 hour(s)).   Time coordinating discharge:  I have spent 35 minutes face to face with the patient and on the ward discussing the patients care, assessment, plan and disposition with other care givers. >50% of the time was devoted counseling the patient about the risks and benefits of treatment/Discharge disposition and coordinating care.   SIGNED:   Dimple Nanas, MD  [facility] 11/25/2022, 8:54 AM   If 7PM-7AM, please contact night-coverage

## 2022-11-21 DIAGNOSIS — N179 Acute kidney failure, unspecified: Secondary | ICD-10-CM | POA: Diagnosis not present

## 2022-11-21 LAB — GLUCOSE, CAPILLARY
Glucose-Capillary: 98 mg/dL (ref 70–99)
Glucose-Capillary: 125 mg/dL — ABNORMAL HIGH (ref 70–99)
Glucose-Capillary: 111 mg/dL — ABNORMAL HIGH (ref 70–99)
Glucose-Capillary: 119 mg/dL — ABNORMAL HIGH (ref 70–99)

## 2022-11-21 LAB — POTASSIUM: Potassium: 3.6 mmol/L (ref 3.5–5.1)

## 2022-11-21 NOTE — Progress Notes (Signed)
Seen at bedside, patient working with physical therapy no complaints at this time.  Discharge summary completed on 6/20, updated today.  Stephania Fragmin MD

## 2022-11-21 NOTE — TOC Progression Note (Signed)
Transition of Care [facility]) - Progression Note    Patient Details  Name: Jody Simpson MRN: [id number] Date of Birth: [birth year]  [facility] CM/SW Contact  Beckie Busing, RN Phone [phone]  11/21/2022, 10:15 AM  Clinical Narrative:    Insurance auth still pending.      Barriers to Discharge: Continued Medical Work up, SNF Pending bed offer  Expected Discharge Plan and Services         Expected Discharge Date: 11/21/22                                     Social Determinants of Health (SDOH) Interventions SDOH Screenings   Food Insecurity: No Food Insecurity (11/12/2022)  Housing: Low Risk  (11/12/2022)  Transportation Needs: No Transportation Needs (11/12/2022)  Utilities: Not At Risk (11/12/2022)  Tobacco Use: Low Risk  (11/13/2022)    Readmission Risk Interventions    07/02/2022    7:37 AM  Readmission Risk Prevention Plan  Transportation Screening Complete  PCP or Specialist Appt within 5-7 Days Complete  Home Care Screening Complete  Medication Review (RN CM) Complete

## 2022-11-21 NOTE — Progress Notes (Addendum)
Patient left in wheelchair for lunch & patient requesting to get back into bed. 4 staff members attempted to put patient back in bed and patient refused and screamed at staff requesting physical therapy. PT Nolberto Hanlon for further assistance to transfer patient safely back in bed with total lift. Patient successfully and safely transferred back to bed using hoyer total lift.

## 2022-11-21 NOTE — Progress Notes (Signed)
Physical Therapy Treatment Patient Details Name: Jody Simpson MRN: [id number] DOB: [birth year] Today's Date: 11/21/2022   History of Present Illness 72 yo morbidly obese female with significant history of diabetes, HTN, asthma, IBS, neuropathy, morbid obestiy, brought here via EMS from a senior living facility for evaluation of a fall.    PT Comments     Pt admitted with above diagnosis.  Pt currently with functional limitations due to the deficits listed below (see PT Problem List). Pt in bed when PT arrived. Pt c/o HA and R knee pain. Pt agreeable to therapy. Pt required increased time, HOB elevated and use of bed rails to transition supine to sit, pt reported SOB and on 2 L/min and O2 saturation 94%. PT set up for scoot transfer to the R side bed to wc, pt required cues for encouragement to perform lateral scoot transfer and max A x 2 to safely complete. Pt demonstrated ability to self propel wc in hallway 90 feet with S, min A for turns and obstacle navigation. Pull to stand from wc to handrail in hallway with max A x 3, max A x 2 and B UE support to maintain static assisted standing for ~20 seconds with cues for encouragement during standing bout total lift pad placed in wc, nursing staff aware of use of total lift to assist pt wc to bed when requested. Pt left seated in wc in personal room all needs in place and on 2 L/min. Pt Pt will benefit from acute skilled PT to increase their independence and safety with mobility to allow discharge.     Recommendations for follow up therapy are one component of a multi-disciplinary discharge planning process, led by the attending physician.  Recommendations may be updated based on patient status, additional functional criteria and insurance authorization.  Follow Up Recommendations  Can patient physically be transported by private vehicle: No    Assistance Recommended at Discharge Frequent or constant Supervision/Assistance  Patient can return home  with the following Two people to help with walking and/or transfers;Two people to help with bathing/dressing/bathroom;Assistance with cooking/housework;Assist for transportation   Equipment Recommendations  None recommended by PT    Recommendations for Other Services       Precautions / Restrictions Precautions Precautions: Fall Precaution Comments: body habitus Restrictions Weight Bearing Restrictions: No     Mobility  Bed Mobility Overal bed mobility: Needs Assistance Bed Mobility: Supine to Sit     Supine to sit: HOB elevated, Min guard     General bed mobility comments: increased time and use of bed rail, pt able to transition to sitting EOB    Transfers Overall transfer level: Needs assistance Equipment used: None Transfers: Sit to/from Stand, Bed to chair/wheelchair/BSC Sit to Stand: Max assist (+3.)          Lateral/Scoot Transfers: Max assist, +2 physical assistance, +2 safety/equipment General transfer comment: pt required increased time and hesitant to try scoot transfer bed to wc, pt removed brake extender and arm rest, provided visual demonstration for set up and technique, max A x 2 with use of bed pad to assist with scoot. pt reported that the transfer bed to wc was easire the last time, PT made pt aware required A x 3 and that the lateral scoot is safe and pt demonstrated prorgress/increased IND. pull to stand in hallway at handrail with A x 3, pt required A to place B LEs due to poor kinesthetic awareness related to peripherial neuropathy, max A x 2 to  maintain static standing for ~ 20s with cues for encouragement, MS x 2 assisting as well as rehab tech able to place total lift pad under pt and nursing staff aware of use of mechanical lift to transfer wc to bed    Ambulation/Gait               General Gait Details: NT   Dance movement psychotherapist Wheelchair mobility: Yes (pt may need to be reassessed for wc  positioning due to body habitus) Wheelchair propulsion: Both lower extermities Wheelchair parts: Supervision/cueing (for straight unobstructed mobility on level surfaces) Distance: 90 Wheelchair Assistance Details (indicate cue type and reason): pt demonstrated deficits with turns and required min A and cues  Modified Rankin (Stroke Patients Only)       Balance Overall balance assessment: Needs assistance, History of Falls Sitting-balance support: Feet supported Sitting balance-[name]: Good     Standing balance support: Bilateral upper extremity supported Standing balance-[name]: Poor Standing balance comment: B UE support and max A x 2 for static standing balance                            Cognition Arousal/Alertness: Awake/alert Behavior During Therapy: WFL for tasks assessed/performed Overall Cognitive Status: Within Functional Limits for tasks assessed                                 General Comments: reporting she needs to do more therapy and go to [facility], pt is fearful and hesitant with mobility tasks        Exercises      General Comments General comments (skin integrity, edema, etc.): R and L LE bandages in place      Pertinent Vitals/Pain Pain Assessment Pain Assessment: Faces Faces Pain Scale: Hurts a little bit Breathing: normal Pain Location: R knee, HA Pain Descriptors / Indicators: Tender, Discomfort, Sore    Home Living                          Prior Function            PT Goals (current goals can now be found in the care plan section) Acute Rehab PT Goals Patient Stated Goal: return home PT Goal Formulation: With patient Time For Goal Achievement: 11/27/22 Potential to Achieve Goals: Fair Progress towards PT goals: Progressing toward goals    Frequency    Min 1X/week      PT Plan Current plan remains appropriate    Co-evaluation              AM-PAC PT "6 Clicks" Mobility    Outcome Measure  Help needed turning from your back to your side while in a flat bed without using bedrails?: A Lot Help needed moving from lying on your back to sitting on the side of a flat bed without using bedrails?: A Little Help needed moving to and from a bed to a chair (including a wheelchair)?: Total Help needed standing up from a chair using your arms (e.g., wheelchair or bedside chair)?: Total Help needed to walk in hospital room?: Total Help needed climbing 3-5 steps with a railing? : Total 6 Click Score: 9    End of Session Equipment Utilized During Treatment: Gait belt Activity Tolerance: Patient tolerated  treatment well;Patient limited by fatigue Patient left: with call bell/phone within reach (personal wc) Nurse Communication: Mobility status;Need for lift equipment PT Visit Diagnosis: Other abnormalities of gait and mobility (R26.89);Muscle weakness (generalized) (M62.81);History of falling (Z91.81);Pain Pain - Right/Left: Right Pain - part of body: Knee     Time: 1012-1055 PT Time Calculation (min) (ACUTE ONLY): 43 min  Charges:  $Therapeutic Activity: 38-52 mins                     Johnny Bridge, PT Acute Rehab    Jacqualyn Posey 11/21/2022, 12:17 PM

## 2022-11-21 NOTE — Progress Notes (Signed)
Physical Therapy Treatment Patient Details Name: Jody Simpson MRN: [id number] DOB: 30-Dec-1950 Today's Date: 11/21/2022   History of Present Illness 73 yo morbidly obese female with significant history of diabetes, HTN, asthma, IBS, neuropathy, morbid obestiy, brought here via EMS from a senior living facility for evaluation of a fall.    PT Comments     Pt admitted with above diagnosis.  Pt currently with functional limitations due to the deficits listed below (see PT Problem List). PT left pt seated in wc following last tx intervention with total lift pad placed under pt and pt and nursing staff aware of use of mechanical lift for pt and staff safety. PT made aware pt requesting to speak with this therapist and refusing to have nursing staff assist pt back to bed. PT arrived and nursing staff had set up total lift, PT provided pt ed on rational and provided cues for pt safety with nursing staff transferring pt to bed via total lift. Pt was able to tolerate sitting upright in personal wc for 1.5 hrs today demonstrating improved activity tolerance. Once returned to bed pt required max A and cues to roll side to side for assist with positioning and hygiene tasks. Pt demonstrated minimal initiation of movement to assist with rolling tasks in PM session. Extensive pt ed provided on importance of getting OOB over the weekend and direct effect on pt recovery process and pt stated she was going to stay in bed because of the use of the total lift. PT reiterated necessity for pt and staff safety at this time due to pt extensive assist,  max+3 for SPT and sit to stand as well as max A x 2 to maintain standing balance for ~20s. Pt stated "at the other place they use the standing lift." PT provided education on pt CLOF impacting safe and proper use of sit to stand lift and that pt has the potential to progress to sit to stand lift, however not safe or assessed at this time. Pt demonstrated verbal understanding. Pt  left in bed all needs in place. Pt will benefit from acute skilled PT to increase their independence and safety with mobility to allow discharge.     Recommendations for follow up therapy are one component of a multi-disciplinary discharge planning process, led by the attending physician.  Recommendations may be updated based on patient status, additional functional criteria and insurance authorization.  Follow Up Recommendations  Can patient physically be transported by private vehicle: No    Assistance Recommended at Discharge Frequent or constant Supervision/Assistance  Patient can return home with the following Two people to help with walking and/or transfers;Two people to help with bathing/dressing/bathroom;Assistance with cooking/housework;Assist for transportation   Equipment Recommendations  None recommended by PT    Recommendations for Other Services       Precautions / Restrictions Precautions Precautions: Fall Precaution Comments: body habitus Restrictions Weight Bearing Restrictions: No     Mobility  Bed Mobility Overal bed mobility: Needs Assistance Bed Mobility: Rolling Rolling: Max assist   Supine to sit: HOB elevated, Min guard     General bed mobility comments: pt required max A to roll side to side for repositioning, hygiene and removal of total lift pad    Transfers Overall transfer level: Needs assistance Equipment used: None Transfers: Bed to chair/wheelchair/BSC Sit to Stand: Max assist (+3.)          Lateral/Scoot Transfers: Max assist, +2 physical assistance, +2 safety/equipment General transfer comment: Pt declining nursing  staff assist via total lift to return to bed    Ambulation/Gait               General Gait Details: NT   Psychologist, counselling mobility: Yes (pt may need to be reassessed for wc positioning due to body habitus) Wheelchair propulsion: Both lower  extermities Wheelchair parts: Supervision/cueing (for straight unobstructed mobility on level surfaces) Distance: 90 Wheelchair Assistance Details (indicate cue type and reason): pt demonstrated deficits with turns and required min A and cues  Modified Rankin (Stroke Patients Only)       Balance Overall balance assessment: Needs assistance, History of Falls Sitting-balance support: Feet supported Sitting balance-[name]: Good     Standing balance support: Bilateral upper extremity supported Standing balance-[name]: Poor Standing balance comment: B UE support and max A x 2 for static standing balance                            Cognition Arousal/Alertness: Awake/alert Behavior During Therapy: WFL for tasks assessed/performed Overall Cognitive Status: Within Functional Limits for tasks assessed                                 General Comments: reporting she needs to do more therapy and go to [facility], pt is fearful and hesitant with mobility tasks        Exercises      General Comments General comments (skin integrity, edema, etc.): R and L LE bandages in place      Pertinent Vitals/Pain Pain Assessment Pain Assessment: Faces Faces Pain Scale: Hurts little more Breathing: normal Pain Location: R knee Pain Descriptors / Indicators: Tender, Discomfort, Sore Pain Intervention(s): Monitored during session, Limited activity within patient's tolerance    Home Living                          Prior Function            PT Goals (current goals can now be found in the care plan section) Acute Rehab PT Goals Patient Stated Goal: return home PT Goal Formulation: With patient Time For Goal Achievement: 11/27/22 Potential to Achieve Goals: Fair Progress towards PT goals: Progressing toward goals    Frequency    Min 1X/week      PT Plan Current plan remains appropriate    Co-evaluation              AM-PAC PT  "6 Clicks" Mobility   Outcome Measure  Help needed turning from your back to your side while in a flat bed without using bedrails?: A Lot Help needed moving from lying on your back to sitting on the side of a flat bed without using bedrails?: A Little Help needed moving to and from a bed to a chair (including a wheelchair)?: Total Help needed standing up from a chair using your arms (e.g., wheelchair or bedside chair)?: Total Help needed to walk in hospital room?: Total Help needed climbing 3-5 steps with a railing? : Total 6 Click Score: 9    End of Session Equipment Utilized During Treatment: Gait belt Activity Tolerance: Treatment limited secondary to agitation Patient left: in bed;with call bell/phone within reach Nurse Communication: Mobility status;Need for lift equipment PT Visit Diagnosis: Other abnormalities of  gait and mobility (R26.89);Muscle weakness (generalized) (M62.81);History of falling (Z91.81);Pain Pain - Right/Left: Right Pain - part of body: Knee     Time: 1325-1341 PT Time Calculation (min) (ACUTE ONLY): 16 min  Charges:  $Therapeutic Activity: 8-22 mins                     Johnny Bridge, PT Acute Rehab   Jacqualyn Posey 11/21/2022, 1:57 PM

## 2022-11-22 LAB — GLUCOSE, CAPILLARY
Glucose-Capillary: 69 mg/dL — ABNORMAL LOW (ref 70–99)
Glucose-Capillary: 73 mg/dL (ref 70–99)
Glucose-Capillary: 110 mg/dL — ABNORMAL HIGH (ref 70–99)
Glucose-Capillary: 124 mg/dL — ABNORMAL HIGH (ref 70–99)
Glucose-Capillary: 139 mg/dL — ABNORMAL HIGH (ref 70–99)

## 2022-11-22 NOTE — TOC Progression Note (Signed)
Transition of Care [facility]) - Progression Note    Patient Details  Name: Jody Simpson MRN: [id number] Date of Birth: 08-17-1950  [facility]) CM/SW Contact  Amada Jupiter, LCSW Phone Number: 11/22/2022, 10:25 AM  Clinical Narrative:     Per insurance portal, authorization still pending.  Uploaded additional documents to portal in attempt to move the auth along if possible.  TOC will continue to monitor.    Barriers to Discharge: Continued Medical Work up, SNF Pending bed offer  Expected Discharge Plan and Services         Expected Discharge Date: 11/21/22                                     Social Determinants of Health (SDOH) Interventions SDOH Screenings   Food Insecurity: No Food Insecurity (11/12/2022)  Housing: Low Risk  (11/12/2022)  Transportation Needs: No Transportation Needs (11/12/2022)  Utilities: Not At Risk (11/12/2022)  Tobacco Use: Low Risk  (11/13/2022)    Readmission Risk Interventions    07/02/2022    7:37 AM  Readmission Risk Prevention Plan  Transportation Screening Complete  PCP or Specialist Appt within 5-7 Days Complete  Home Care Screening Complete  Medication Review (RN CM) Complete

## 2022-11-22 NOTE — Progress Notes (Signed)
Seen at bedside, no complaints.  Vitals remained stable Discharge summary completed 11/20/2022.  Stephania Fragmin MD [facility]

## 2022-11-23 LAB — GLUCOSE, CAPILLARY
Glucose-Capillary: 63 mg/dL — ABNORMAL LOW (ref 70–99)
Glucose-Capillary: 83 mg/dL (ref 70–99)
Glucose-Capillary: 121 mg/dL — ABNORMAL HIGH (ref 70–99)
Glucose-Capillary: 123 mg/dL — ABNORMAL HIGH (ref 70–99)
Glucose-Capillary: 141 mg/dL — ABNORMAL HIGH (ref 70–99)

## 2022-11-23 NOTE — TOC Progression Note (Signed)
Transition of Care [facility]) - Progression Note    Patient Details  Name: Jody Simpson MRN: [id number] Date of Birth: [birth year]  Transition of Care [facility]) CM/SW Contact  Larrie Kass, LCSW Phone Number: 11/23/2022, 11:01 AM  Clinical Narrative:    Insurance auth pending. TOC to follow      Barriers to Discharge: Continued Medical Work up, SNF Pending bed offer  Expected Discharge Plan and Services         Expected Discharge Date: 11/21/22                                     Social Determinants of Health (SDOH) Interventions SDOH Screenings   Food Insecurity: No Food Insecurity (11/12/2022)  Housing: Low Risk  (11/12/2022)  Transportation Needs: No Transportation Needs (11/12/2022)  Utilities: Not At Risk (11/12/2022)  Tobacco Use: Low Risk  (11/13/2022)    Readmission Risk Interventions    07/02/2022    7:37 AM  Readmission Risk Prevention Plan  Transportation Screening Complete  PCP or Specialist Appt within 5-7 Days Complete  Home Care Screening Complete  Medication Review (RN CM) Complete

## 2022-11-23 NOTE — Progress Notes (Signed)
Seen at bedside, no complaints Vitals remained stable Discharge summary completed 6/20, updated today.  Stephania Fragmin MD [facility]

## 2022-11-23 NOTE — Care Plan (Signed)
Pt's blood sugar was 63 this AM. Pt given a coke. Blood sugar recheck now 83. Pt starting on breakfast.

## 2022-11-24 LAB — GLUCOSE, CAPILLARY
Glucose-Capillary: 115 mg/dL — ABNORMAL HIGH (ref 70–99)
Glucose-Capillary: 100 mg/dL — ABNORMAL HIGH (ref 70–99)
Glucose-Capillary: 153 mg/dL — ABNORMAL HIGH (ref 70–99)
Glucose-Capillary: 151 mg/dL — ABNORMAL HIGH (ref 70–99)
Glucose-Capillary: 153 mg/dL — ABNORMAL HIGH (ref 70–99)

## 2022-11-24 NOTE — TOC Progression Note (Addendum)
Transition of Care [facility]) - Progression Note    Patient Details  Name: Jody Simpson MRN: [id number] Date of Birth: [birth year]  Transition of Care [facility]) CM/SW Contact  Howell Rucks, RN Phone Number: 11/24/2022, 2:34 PM  Clinical Narrative:  NCM met with pt at bedside per pt's request,  pt reports she would like to change her SNF choice to [facility] in [place], [place]. Johnell Comings notified (initiated current pending SNF request), reports she will email her [employer] contact with notification of facility change. Auth remains pending.    -3:39pm Per Teams Chat from [place]:  [employer] Medicare approval 6/24 - 7/6 for sub acute level one, review due 7/7. Certification: [id number]. Reviewer is Wallace Cullens, phone: [phone], fax: [phone]. Confirmed it was changed to [facility]. Team/attending notified.   -3:50pm Call to [facility] in admissions at [facility] at main facility number [phone], informed of SNF approval, auth#, days approved and reviewer contact information. Christy confirmed bed available tomorrow, RM 305, Nurse Report [phone], if no answer, call main facility number at [phone]. Team/attending notified. Plan for transfer tomorrow.        Barriers to Discharge: Continued Medical Work up, SNF Pending bed offer  Expected Discharge Plan and Services         Expected Discharge Date: 11/21/22                                     Social Determinants of Health (SDOH) Interventions SDOH Screenings   Food Insecurity: No Food Insecurity (11/12/2022)  Housing: Low Risk  (11/12/2022)  Transportation Needs: No Transportation Needs (11/12/2022)  Utilities: Not At Risk (11/12/2022)  Tobacco Use: Low Risk  (11/13/2022)    Readmission Risk Interventions    07/02/2022    7:37 AM  Readmission Risk Prevention Plan  Transportation Screening Complete  PCP or Specialist Appt within 5-7 Days Complete  Home Care Screening Complete   Medication Review (RN CM) Complete

## 2022-11-24 NOTE — Progress Notes (Signed)
Patient has now remained stable for several days.  Currently pending placement.  No acute events overnight. Vital signs remained stable. Discharge summary completed on 6/20, updated today.  No further changes.  Stephania Fragmin MD [facility]

## 2022-11-24 NOTE — Progress Notes (Signed)
Physical Therapy Treatment Patient Details Name: Jody Simpson MRN: [id number] DOB: 1951/05/27 Today's Date: 11/24/2022   History of Present Illness 72 yo morbidly obese female with significant history of diabetes, HTN, asthma, IBS, neuropathy, morbid obestiy, brought here via EMS from a senior living facility for evaluation of a fall.    PT Comments     Pt admitted with above diagnosis.  Pt currently with functional limitations due to the deficits listed below (see PT Problem List). Pt in bed when PT arrived, A x 2 present through out tx session. Pt demonstrated apparent fear of falling and perceived self limiting behaviors limiting progression with therapy with transfer trials with steady lift and lateral scoot. Pt left in bed all needs in place, nursing staff aware of need for assist for hygiene tasks. Pt will benefit from acute skilled PT to increase their independence and safety with mobility to allow discharge.     Recommendations for follow up therapy are one component of a multi-disciplinary discharge planning process, led by the attending physician.  Recommendations may be updated based on patient status, additional functional criteria and insurance authorization.  Follow Up Recommendations  Can patient physically be transported by private vehicle: No    Assistance Recommended at Discharge Frequent or constant Supervision/Assistance  Patient can return home with the following Two people to help with walking and/or transfers;Two people to help with bathing/dressing/bathroom;Assistance with cooking/housework;Assist for transportation   Equipment Recommendations  None recommended by PT    Recommendations for Other Services       Precautions / Restrictions Precautions Precautions: Fall Precaution Comments: body habitus Restrictions Weight Bearing Restrictions: No     Mobility  Bed Mobility Overal bed mobility: Needs Assistance Bed Mobility: Rolling, Supine to Sit, Sit to  Supine Rolling: Mod assist   Supine to sit: HOB elevated, Min guard Sit to supine: Max assist (for B LEs)   General bed mobility comments: cues, increased time and use of hospital bed    Transfers Overall transfer level: Needs assistance                 General transfer comment: PT trail with steatdy lift x 2 for sit to stand from EOB with max cues and A x 2 unable to assist pt to elevate buttocks from bed, pt indicated having a fear of falling and hesitation with transfser tasks. PT set up and trialed scoot transfer bed to personal wc to the R as pt participated with at last tx intervention and pt unable to complete task, resisiting and pushing posteriorly with A x 2 for assist, set up and trial with pt to scoot laterally to recliner with arm rest down, pt indicated even with 2 people she could not do it today, extensive time, education on importance of active participation with therapy and demonstrating progress as well as encouragement and pt unabel to complete task with A x 2. pt has hx of peripherial neuropathy and indicated she could not move or feel her feet L primarily, PT attemtped with B LE blocked and no change in progression toward scoot transfer. pt elected to discontinue trials and reported her asthma was bad and she did not feel well today. beinging of tx session today pt 94% on 2 L/min--no supplemental O2 PLOF, with exertion pt desaturated to 88% on 2 L/min requiring cues for pursed lip breathing and 9s to recover to 90% on 2 L/min. pt was able to scoot laterally to the L on EOB wtih B LEs  blocked to reutrn to [facility] with increased time and cues with multiple theraputic rest periods.    Ambulation/Gait               General Gait Details: NT   Stairs             Wheelchair Mobility    Modified Rankin (Stroke Patients Only)       Balance Overall balance assessment: Needs assistance, History of Falls Sitting-balance support: Feet supported Sitting  balance-[name]: Good     Standing balance support: Bilateral upper extremity supported Standing balance-[name]: Poor Standing balance comment: B UE support and max A x 2 for static standing balance                            Cognition Arousal/Alertness: Awake/alert Behavior During Therapy: WFL for tasks assessed/performed Overall Cognitive Status: Within Functional Limits for tasks assessed                                 General Comments: reporting she needs to do more therapy and go to [place], pt is fearful and hesitant with mobility tasks        Exercises      General Comments        Pertinent Vitals/Pain Pain Assessment Pain Assessment: Faces Faces Pain Scale: Hurts a little bit Breathing: normal Pain Location: R knee Pain Descriptors / Indicators: Tender, Discomfort, Sore Pain Intervention(s): Limited activity within patient's tolerance, Monitored during session    Home Living Family/patient expects to be discharged to:: Other (Comment) [facility]) Living Arrangements: Alone Available Help at Discharge: Friend(s);Available PRN/intermittently Type of Home: Apartment Home Access: Elevator       Home Layout: One level Home Equipment: Rollator (4 wheels);Wheelchair - manual;Tub bench Additional Comments: pt reports has adjustable bed and lift chair    Prior Function            PT Goals (current goals can now be found in the care plan section) Acute Rehab PT Goals Patient Stated Goal: return home PT Goal Formulation: With patient Time For Goal Achievement: 11/27/22 Potential to Achieve Goals: Fair    Frequency    Min 1X/week      PT Plan      Co-evaluation              AM-PAC PT "6 Clicks" Mobility   Outcome Measure  Help needed turning from your back to your side while in a flat bed without using bedrails?: A Lot Help needed moving from lying on your back to sitting on the side of  a flat bed without using bedrails?: A Little Help needed moving to and from a bed to a chair (including a wheelchair)?: Total Help needed standing up from a chair using your arms (e.g., wheelchair or bedside chair)?: Total Help needed to walk in hospital room?: Total Help needed climbing 3-5 steps with a railing? : Total 6 Click Score: 9    End of Session Equipment Utilized During Treatment: Gait belt Activity Tolerance: Patient limited by fatigue Patient left: with call bell/phone within reach Nurse Communication: Mobility status;Need for lift equipment (total lift pad in pts room) PT Visit Diagnosis: Other abnormalities of gait and mobility (R26.89);Muscle weakness (generalized) (M62.81);History of falling (Z91.81);Pain Pain - Right/Left: Right Pain - part of body: Knee     Time: 4401-0272 PT Time  Calculation (min) (ACUTE ONLY): 54 min  Charges:  $Therapeutic Activity: 53-67 mins                     Jody Simpson, PT Acute Rehab    Jody Simpson 11/24/2022, 12:00 PM

## 2022-11-25 LAB — GLUCOSE, CAPILLARY
Glucose-Capillary: 140 mg/dL — ABNORMAL HIGH (ref 70–99)
Glucose-Capillary: 145 mg/dL — ABNORMAL HIGH (ref 70–99)

## 2022-11-25 NOTE — Plan of Care (Addendum)
0900: Called Report to [phone], no answer, called main facility number at [phone], no answer. Called [phone], no answer.  0920: Called report to Nurse Bonita Quin at [phone] and case manager Rubin Payor to set up transport. 1320: [facility] here to transport patient, sister in law to pick up wheelchair. PIV removed, no further needs.  Problem: Education: Goal: Knowledge of General Education information will improve Description: Including pain rating scale, medication(s)/side effects and non-pharmacologic comfort measures Outcome: Adequate for Discharge   Problem: Health Behavior/Discharge Planning: Goal: Ability to manage health-related needs will improve Outcome: Adequate for Discharge   Problem: Clinical Measurements: Goal: Ability to maintain clinical measurements within normal limits will improve Outcome: Adequate for Discharge Goal: Will remain free from infection Outcome: Adequate for Discharge Goal: Diagnostic test results will improve Outcome: Adequate for Discharge Goal: Respiratory complications will improve Outcome: Adequate for Discharge Goal: Cardiovascular complication will be avoided Outcome: Adequate for Discharge   Problem: Activity: Goal: Risk for activity intolerance will decrease Outcome: Adequate for Discharge   Problem: Nutrition: Goal: Adequate nutrition will be maintained Outcome: Adequate for Discharge   Problem: Coping: Goal: Level of anxiety will decrease Outcome: Adequate for Discharge   Problem: Elimination: Goal: Will not experience complications related to bowel motility Outcome: Adequate for Discharge Goal: Will not experience complications related to urinary retention Outcome: Adequate for Discharge   Problem: Pain Managment: Goal: General experience of comfort will improve Outcome: Adequate for Discharge   Problem: Safety: Goal: Ability to remain free from injury will improve Outcome: Adequate for Discharge   Problem: Skin  Integrity: Goal: Risk for impaired skin integrity will decrease Outcome: Adequate for Discharge   Problem: Education: Goal: Ability to describe self-care measures that may prevent or decrease complications (Diabetes Survival Skills Education) will improve Outcome: Adequate for Discharge Goal: Individualized Educational Video(s) Outcome: Adequate for Discharge   Problem: Coping: Goal: Ability to adjust to condition or change in health will improve Outcome: Adequate for Discharge   Problem: Fluid Volume: Goal: Ability to maintain a balanced intake and output will improve Outcome: Adequate for Discharge   Problem: Health Behavior/Discharge Planning: Goal: Ability to identify and utilize available resources and services will improve Outcome: Adequate for Discharge Goal: Ability to manage health-related needs will improve Outcome: Adequate for Discharge   Problem: Metabolic: Goal: Ability to maintain appropriate glucose levels will improve Outcome: Adequate for Discharge   Problem: Nutritional: Goal: Maintenance of adequate nutrition will improve Outcome: Adequate for Discharge Goal: Progress toward achieving an optimal weight will improve Outcome: Adequate for Discharge   Problem: Skin Integrity: Goal: Risk for impaired skin integrity will decrease Outcome: Adequate for Discharge   Problem: Tissue Perfusion: Goal: Adequacy of tissue perfusion will improve Outcome: Adequate for Discharge

## 2022-11-25 NOTE — TOC Transition Note (Signed)
Transition of Care [facility]) - CM/SW Discharge Note   Patient Details  Name: Jody Simpson MRN: [id number] Date of Birth: 02-Apr-1951  [facility] CM/SW Contact:  Armanda Heritage, RN Phone Number: 11/25/2022, 9:34 AM   Clinical Narrative:    Patient will discharge to [facility] health and rehab room 305.  DC summary faxed via [facility].  [facility] transport arranged. No further TOC needs identified.   Final next level of care: Skilled Nursing Facility Barriers to Discharge: Continued Medical Work up, SNF Pending bed offer   Patient Goals and CMS Choice      Discharge Placement                         Discharge Plan and Services Additional resources added to the After Visit Summary for                                       Social Determinants of Health (SDOH) Interventions SDOH Screenings   Food Insecurity: No Food Insecurity (11/12/2022)  Housing: Low Risk  (11/12/2022)  Transportation Needs: No Transportation Needs (11/12/2022)  Utilities: Not At Risk (11/12/2022)  Tobacco Use: Low Risk  (11/13/2022)     Readmission Risk Interventions    07/02/2022    7:37 AM  Readmission Risk Prevention Plan  Transportation Screening Complete  PCP or Specialist Appt within 5-7 Days Complete  Home Care Screening Complete  Medication Review (RN CM) Complete

## 2022-11-25 NOTE — Progress Notes (Signed)
No acute events overnight.  Doing well, no complaints Vital signs remained stable. Discharge summary completed 6/20, updated today on 11/25/2022. No further changes.  Stephania Fragmin MD [facility]

## 2023-01-05 ENCOUNTER — Ambulatory Visit: Payer: Medicare HMO | Admitting: Cardiovascular Disease

## 2023-01-06 ENCOUNTER — Ambulatory Visit: Payer: Medicare HMO | Admitting: Cardiology

## 2023-01-16 NOTE — Progress Notes (Deleted)
Cardiology Clinic Note   Date: 01/16/2023 ID: Jody Simpson, DOB [birth year], MRN [id number]  Primary Cardiologist:  Reatha Harps, MD  Patient Profile    Jody Simpson is a 72 y.o. female who presents to the clinic today for ***    Past medical history significant for: AV block. HFpEF. Echo 06/16/2022: EF 60 to 65%.  No RWMA.  Mild LVH.  Indeterminate diastolic parameters.  Normal RV function.  Moderately elevated PA pressure.  Moderate LAE.  Mild MR.  Severe MAC.  Mild aortic valve stenosis. T2DM. Hypertension. Hyperlipidemia. Lipid panel 06/17/2022: LDL 78, HDL 29, TG 164, total 140. Solitary kidney.     History of Present Illness    Jody Simpson was first evaluated by cardiology on 06/16/2022 for LBBB during hospital admission.  Patient presented to the ED on 06/15/2022 via EMS from independent living after sliding out of a chair and being found unresponsive by facility staff.  She had regained consciousness upon EMS arrival.  SBP in the 90s improved with IV fluid bolus.  SpO2 in the 80s improved on nonrebreather.  Patient reported having the flu for several days with fatigue and weakness as well as decreased p.o. intake.  She was unsure if her fall was mechanical.  She denied dysuria but reported strong smelling urine. Admission diagnostic revealed hyponatremia 134, elevated creatinine 1.33 and GFR 43, elevated glucose 361.  BNP 677.  CK 1000.  High sensitive troponin 29 >>108.  Lactic acid 3.3 >>1.7.  Phosphorus 4.6.  Magnesium 1.6.  CBC diff with leukocytosis 12 500, hemoglobin 11.1.  INR 1.1.  Influenza A positive. UA + glucose, hemoglobin, ketones, leukocyte, nitrate, protein, bacteria, RBC.  Blood culture x 2 and urine culture sent. CT head showed no acute findings.  Chest x-ray revealed mild pulmonary edema.  Pelvis x-ray without acute fracture.EKG revealed sinus rhythm with ventricular rate of 64bpm, LBBB new comparing to EKG from 2018. She was admitted for sepsis secondary  to UTI, influenza a and AKI. Patient was started on antibiotic and IVF.  She was initially to be discharged on 06/24/2022 but she had continued weakness so her admission was extended.  She was discharged on 06/26/2022 back to independent living.  Continued recommendation for SNF placement but she declined.   Patient presented to the ED on 07/01/2022 via EMS for AMS.  EMS vitals: 130/50, 120 bpm, 40 RR, 90% RA increased to 100% 8 L nonrebreather.  Admission testing revealed creatinine 2.3.  WBC 10.6.  Lactic acid 1.2. UA positive glucose, protein, moderate leukocytes.  Troponin 51>> 40.  Negative respiratory panel.  BNP 400.6.  Patient admitted for sepsis secondary to community-acquired pneumonia.  Prolactin was negative therefore antibiotics were discontinued and she continued to be treated with prednisone and bronchodilators.  Her AKI improved with IV fluids and after several days returned back to normal.  She was weaned off O2.  She was discharged on 07/08/2022.  Patient was last seen in the office by Dr. Flora Lipps on 07/21/2022 for hospital follow-up.  EKGs from hospital admission were reviewed revealing evidence of Wenckebach and intermittent LBBB as well as first-degree block and intermittent 2-1 block.  Beta-blocker was discontinued.  She had not completed her ZIO monitor at the time of her visit.  EKG in the office was sinus tachycardia versus SVT.  Patient was instructed to complete heart monitor with plan to rechallenge beta-blocker.  She was started on Toprol 25 mg.  Patient presented to the ED on 11/12/2022  for unwitnessed fall at SNF.  Patient reported transferring from wheelchair to recliner but missed and slid to the ground twisting her right leg.  She was admitted for observation.  She was seen by cardiology in consultation on 11/13/2022 for bradycardia.  Patient with 2-1 AV block with occasional complete heart block.  She had not been able to wear ZIO monitor due to severe allergy to adhesive.  Question  if patient has tachybradycardia syndrome and will need permanent pacemaker.  Dr. Was consulted and given lack of symptoms and very poor functional ability she was not good candidate for invasive procedures.  Plan to continue to monitor and if more symptomatic consider EP evaluation.  30-day hypoallergenic monitor ordered with plan to follow-up with EP.  Patient was discharged to inpatient rehab 11/25/2022.  Today, patient ***   ROS: All other systems reviewed and are otherwise negative except as noted in History of Present Illness.  Studies Reviewed       ***  Risk Assessment/Calculations    {Does this patient have ATRIAL FIBRILLATION?[phone]} No BP recorded.  {Refresh Note OR Click here to enter BP  :1}***        Physical Exam    VS:  There were no vitals taken for this visit. , BMI There is no height or weight on file to calculate BMI.  GEN: Well nourished, well developed, in no acute distress. Neck: No JVD or carotid bruits. Cardiac: *** RRR. No murmurs. No rubs or gallops.   Respiratory:  Respirations regular and unlabored. Clear to auscultation without rales, wheezing or rhonchi. GI: Soft, nontender, nondistended. Extremities: Radials/DP/PT 2+ and equal bilaterally. No clubbing or cyanosis. No edema ***  Skin: Warm and dry, no rash. Neuro: Strength intact.  Assessment & Plan   ***  Disposition: ***     {Are you ordering a CV Procedure (e.g. stress test, cath, DCCV, TEE, etc)?   Press F2        :[id number]}   Signed, Etta Grandchild. Chord Takahashi, DNP, NP-C

## 2023-01-20 ENCOUNTER — Ambulatory Visit: Payer: Medicare HMO | Attending: Cardiovascular Disease | Admitting: Student

## 2023-02-01 DEATH — deceased
